# Patient Record
Sex: Female | Born: 1980 | Race: Black or African American | Hispanic: No | Smoking: Never smoker
Health system: Southern US, Community
[De-identification: ages and names within clinical notes are randomized; demographics above are authoritative.]

## PROBLEM LIST (undated history)

## (undated) ENCOUNTER — Inpatient Hospital Stay (HOSPITAL_COMMUNITY): Payer: Self-pay

## (undated) DIAGNOSIS — E119 Type 2 diabetes mellitus without complications: Secondary | ICD-10-CM

## (undated) DIAGNOSIS — E669 Obesity, unspecified: Secondary | ICD-10-CM

## (undated) DIAGNOSIS — R87619 Unspecified abnormal cytological findings in specimens from cervix uteri: Secondary | ICD-10-CM

## (undated) DIAGNOSIS — B009 Herpesviral infection, unspecified: Secondary | ICD-10-CM

## (undated) HISTORY — DX: Unspecified abnormal cytological findings in specimens from cervix uteri: R87.619

## (undated) HISTORY — PX: WISDOM TOOTH EXTRACTION: SHX21

## (undated) HISTORY — DX: Herpesviral infection, unspecified: B00.9

## (undated) HISTORY — DX: Obesity, unspecified: E66.9

---

## 2009-03-06 ENCOUNTER — Other Ambulatory Visit: Admission: RE | Admit: 2009-03-06 | Discharge: 2009-03-06 | Payer: Self-pay | Admitting: Family Medicine

## 2009-03-06 ENCOUNTER — Encounter: Payer: Self-pay | Admitting: Family Medicine

## 2009-03-06 ENCOUNTER — Ambulatory Visit: Payer: Self-pay | Admitting: Family Medicine

## 2009-03-14 ENCOUNTER — Encounter: Payer: Self-pay | Admitting: Family Medicine

## 2009-03-23 ENCOUNTER — Telehealth (INDEPENDENT_AMBULATORY_CARE_PROVIDER_SITE_OTHER): Payer: Self-pay | Admitting: *Deleted

## 2009-04-06 ENCOUNTER — Ambulatory Visit: Payer: Self-pay | Admitting: Family Medicine

## 2009-04-06 DIAGNOSIS — R030 Elevated blood-pressure reading, without diagnosis of hypertension: Secondary | ICD-10-CM | POA: Insufficient documentation

## 2009-05-18 ENCOUNTER — Telehealth: Payer: Self-pay | Admitting: Family Medicine

## 2010-01-23 ENCOUNTER — Telehealth (INDEPENDENT_AMBULATORY_CARE_PROVIDER_SITE_OTHER): Payer: Self-pay | Admitting: *Deleted

## 2010-03-27 ENCOUNTER — Ambulatory Visit: Payer: Self-pay | Admitting: Family Medicine

## 2010-03-27 DIAGNOSIS — H9209 Otalgia, unspecified ear: Secondary | ICD-10-CM | POA: Insufficient documentation

## 2010-03-27 DIAGNOSIS — H6120 Impacted cerumen, unspecified ear: Secondary | ICD-10-CM

## 2010-03-31 ENCOUNTER — Telehealth (INDEPENDENT_AMBULATORY_CARE_PROVIDER_SITE_OTHER): Payer: Self-pay

## 2010-04-10 ENCOUNTER — Other Ambulatory Visit
Admission: RE | Admit: 2010-04-10 | Discharge: 2010-04-10 | Payer: Self-pay | Source: Home / Self Care | Admitting: Family Medicine

## 2010-04-10 ENCOUNTER — Ambulatory Visit: Payer: Self-pay | Admitting: Family Medicine

## 2010-04-10 LAB — CONVERTED CEMR LAB
AST: 14 units/L
Alkaline Phosphatase: 98 units/L
BUN: 13 mg/dL
Creatinine, Ser: 0.71 mg/dL
HDL: 58 mg/dL
LDL Cholesterol: 70 mg/dL
Potassium: 4.3 meq/L
Total Bilirubin: 0.2 mg/dL
Triglycerides: 118 mg/dL

## 2010-04-12 ENCOUNTER — Ambulatory Visit: Payer: Self-pay | Admitting: Family Medicine

## 2010-04-12 LAB — CONVERTED CEMR LAB: Pap Smear: NEGATIVE

## 2010-04-13 ENCOUNTER — Encounter: Payer: Self-pay | Admitting: Family Medicine

## 2010-06-05 NOTE — Assessment & Plan Note (Signed)
Summary: CPE with pap   Vital Signs:  Patient profile:   30 year old female Height:      67 inches Weight:      254 pounds BMI:     39.93 O2 Sat:      100 % on Room air Pulse rate:   86 / minute BP sitting:   117 / 79  (left arm) Cuff size:   large  Vitals Entered By: Payton Spark CMA (April 10, 2010 9:02 AM)  O2 Flow:  Room air CC: CPE w/ pap. Needs immunizations and ears flushed.   Primary Care Provider:  Seymour Bars DO  CC:  CPE w/ pap. Needs immunizations and ears flushed..  History of Present Illness: Tammy Roman presents for CPE with pap smear.  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  Doing well on Yasmin.  Would like STD testing and titers done for work.  She is due for Tetanus vaccine and flu shot.  Has an aunt who had premenopausal breast cancer but no other family members.  She is not a smoker.  Due for fasting labs.  Rarely gets HSV outbreaks.  C/O continued ear pressure after being treated at [facility] for an ear infection.  Current Medications (verified): 1)  Valtrex 500 Mg Tabs (Valacyclovir Hcl) .Marland Kitchen.. 1 Tab By Mouth Daily 2)  Yasmin 28 3-0.03 Mg Tabs (Drospirenone-Ethinyl Estradiol) .Marland Kitchen.. 1 Tab By Mouth Daily  Allergies (verified): No Known Drug Allergies  Past History:  Past Medical History: Reviewed history from 03/27/2010 and no changes required. G1P1 obesity HSV 2  Family History: M/F + HTN, high cholesterol paternal aunt premenopausal breast cancer  Social History: Works for [employer]. Married.  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 Never smoked.  Occas ETOH. No exercise.  Review of Systems  The patient denies anorexia, fever, weight loss, weight gain, vision loss, decreased hearing, hoarseness, chest pain, syncope, dyspnea on exertion, peripheral edema, prolonged cough, headaches, hemoptysis, abdominal pain, melena, hematochezia, severe indigestion/heartburn, hematuria, incontinence, genital sores, muscle weakness, suspicious skin  lesions, transient blindness, difficulty walking, depression, unusual weight change, abnormal bleeding, enlarged lymph nodes, angioedema, breast masses, and testicular masses.    Physical Exam  General:  alert, well-developed, well-nourished, and well-hydrated.  obese Head:  normocephalic and atraumatic.   Eyes:  pupils equal, pupils round, and pupils reactive to light.   Ears:  EACs patent; TMs translucent and gray with good cone of light and bony landmarks. after ear irrigation here. Mouth:  good dentition and pharynx pink and moist.   Neck:  no masses.   Breasts:  No mass, nodules, thickening, tenderness, bulging, retraction, inflamation, nipple discharge or skin changes noted.   Lungs:  Normal respiratory effort, chest expands symmetrically. Lungs are clear to auscultation, no crackles or wheezes. Heart:  Normal rate and regular rhythm. S1 and S2 normal without gallop, murmur, click, rub or other extra sounds. Abdomen:  Bowel sounds positive,abdomen soft and non-tender without masses, organomegaly or hernias noted. Genitalia:  Pelvic Exam:        External: normal female genitalia without lesions or masses        Vagina: normal without lesions or masses        Cervix: normal without lesions or masses        Adnexa: normal bimanual exam without masses or fullness        Uterus: normal by palpation        Pap smear: performed Pulses:  2+ radial  and pedal pulses Extremities:  trace LE edema with pes planus Skin:  color normal.   Cervical Nodes:  No lymphadenopathy noted Psych:  good eye contact, not anxious appearing, and not depressed appearing.     Impression & Recommendations:  Problem # 1:  ROUTINE GYNECOLOGICAL EXAMINATION (ICD-V72.31) Thin prep pap done. Continue Yasmin for birth control. BP at goal.  BMI 39 c/w class II obesity. Work on [employer], regular exercise, wt loss. Add MVI and Vitamin D 1,000 International Units/ day.   STD testing done per pt request. Fasting  labs ordered. Immunizations updated. RTC 1 yr, sooner if needed. Ears successfully irrigated today.  Complete Medication List: 1)  Valtrex 500 Mg Tabs (Valacyclovir hcl) .Marland Kitchen.. 1 tab by mouth daily 2)  Yasmin 28 3-0.03 Mg Tabs (Drospirenone-ethinyl estradiol) .Marland Kitchen.. 1 tab by mouth daily  Other Orders: T-Comprehensive Metabolic Panel [phone]) T-Lipid Profile [phone]) T-HIV Antibody  (Reflex) [id number]) T-Hepatitis C Antibody (25427-06237) T-RPR (Syphilis) [phone]) T-Measles (Rubeola) Antibody IgG (60737-10626) T-Mumps Virus Antibody, IgG (94854-62703) T-Rubella Antibody (50093-81829) T-Varicella-Zoster Antibody (93716-96789) Tdap => 53yrs IM (38101) Admin 1st Vaccine (75102) TB Skin Test (58527) Admin of Any Addtl Vaccine (78242) Admin 1st Vaccine (35361) Flu Vaccine 49yrs + (44315)  Patient Instructions: 1)  Stay on current meds. 2)  Fasting labs at [facility] today. 3)  Will call you w/ results tomorrow. 4)  Will call you w/ pap smear results in the next week. 5)  Return in 1 yr, sooner if needed.   Orders Added: 1)  T-Comprehensive Metabolic Panel [[id number]] 2)  T-Lipid Profile [80061-22930] 3)  T-HIV Antibody  (Reflex) [[id number]] 4)  T-Hepatitis C Antibody [09326-71245] 5)  T-RPR (Syphilis) [80998-33825] 6)  T-Measles (Rubeola) Antibody IgG [05397-67341] 7)  T-Mumps Virus Antibody, IgG [93790-24097] 8)  T-Rubella Antibody [35329-92426] 9)  T-Varicella-Zoster Antibody [83419-62229] 10)  Est. Patient age 6-39 [20] 3)  Tdap => 30yrs IM [90715] 12)  Admin 1st Vaccine [90471] 13)  TB Skin Test [86580] 14)  Admin of Any Addtl Vaccine [90472] 15)  Admin 1st Vaccine [90471] 16)  Flu Vaccine 23yrs + [79892]   Immunizations Administered:  Tetanus Vaccine:    Vaccine Type: Tdap    Site: right deltoid    Dose: 0.5 ml    Route: IM    Given by: Payton Spark CMA    Exp. Date: 02/23/2012    Lot #: [id number]    VIS given: 03/23/08 version given  April 10, 2010.  PPD Skin Test:    Vaccine Type: PPD    Site: left forearm    Dose: 0.1 ml    Route: ID    Given by: Payton Spark CMA   Immunizations Administered:  Tetanus Vaccine:    Vaccine Type: Tdap    Site: right deltoid    Dose: 0.5 ml    Route: IM    Given by: Payton Spark CMA    Exp. Date: 02/23/2012    Lot #: [id number]    VIS given: 03/23/08 version given April 10, 2010.  PPD Skin Test:    Vaccine Type: PPD    Site: left forearm    Dose: 0.1 ml    Route: ID    Given by: Payton Spark CMA Flu Vaccine Consent Questions     Do you have a history of severe allergic reactions to this vaccine? no    Any prior history of allergic reactions to egg and/or gelatin? no    Do you have a sensitivity to the  preservative Thimersol? no    Do you have a past history of Guillan-Barre Syndrome? no    Do you currently have an acute febrile illness? no    Have you ever had a severe reaction to latex? no    Vaccine information given and explained to patient? yes    Are you currently pregnant? no    Lot Number:[id number]   Exp Date:11/03/2010   Site Given  Left Deltoid IM: left forearm    Dose: 0.1 ml    Route: ID    Given by: Payton Spark CMA     .lbflu   Appended Document: CPE with pap     Allergies: No Known Drug Allergies   Complete Medication List: 1)  Valtrex 500 Mg Tabs (Valacyclovir hcl) .Marland Kitchen.. 1 tab by mouth daily 2)  Yasmin 28 3-0.03 Mg Tabs (Drospirenone-ethinyl estradiol) .Marland Kitchen.. 1 tab by mouth daily    PPD Results    Date of reading: 04/12/2010    Results: < 5mm    Interpretation: negative

## 2010-06-05 NOTE — Assessment & Plan Note (Signed)
Summary: EAR PAIN/TJ (rm 2)   Vital Signs:  Patient Profile:   30 Years Old Female CC:      pt c/o right ear pain x this AM Height:     67 inches Weight:      254.50 pounds O2 Sat:      100 % O2 treatment:    Room Air Temp:     98.5 degrees F oral Pulse rate:   90 / minute Resp:     16 per minute BP sitting:   131 / 82  (left arm) Cuff size:   large  Pt. in pain?   yes    Location:   right ear  Vitals Entered By: Lajean Saver RN (March 27, 2010 6:18 PM)                   Updated Prior Medication List: VALTREX 500 MG TABS (VALACYCLOVIR HCL) 1 tab by mouth daily YASMIN 28 3-0.03 MG TABS (DROSPIRENONE-ETHINYL ESTRADIOL) 1 tab by mouth daily  Current Allergies (reviewed today): No known allergies History of Present Illness Chief Complaint: pt c/o right ear pain x this AM History of Present Illness:  Subjective:  Patient complains of onset of right earache this morning.  She states that she has had a cold and sinus congestion for about a week, now resolved.  She also admits that she clenches her jaw whenever she is tens and often chews gum.  No drainage from ear.  REVIEW OF SYSTEMS Constitutional Symptoms       Complains of fever.     Denies chills, night sweats, weight loss, weight gain, and fatigue.  Eyes       Complains of glasses and contact lenses.      Denies change in vision, eye pain, eye discharge, and eye surgery. Ear/Nose/Throat/Mouth       Complains of change in hearing and ear pain.      Denies hearing loss/aids, ear discharge, dizziness, frequent runny nose, frequent nose bleeds, sinus problems, sore throat, hoarseness, and tooth pain or bleeding.  Respiratory       Denies dry cough, productive cough, wheezing, shortness of breath, asthma, bronchitis, and emphysema/COPD.  Cardiovascular       Denies murmurs, chest pain, and tires easily with exhertion.    Gastrointestinal       Complains of constipation.      Denies stomach pain, nausea/vomiting,  diarrhea, blood in bowel movements, and indigestion. Genitourniary       Denies painful urination, kidney stones, and loss of urinary control. Neurological       Complains of headaches.      Denies paralysis, seizures, and fainting/blackouts. Musculoskeletal       Denies muscle pain, joint pain, joint stiffness, decreased range of motion, redness, swelling, muscle weakness, and gout.  Skin       Denies bruising, unusual mles/lumps or sores, and hair/skin or nail changes.  Psych       Denies mood changes, temper/anger issues, anxiety/stress, speech problems, depression, and sleep problems. Other Comments: pt c/o right ear pain and HA x this AM. Pt reports taking Tylenol today. decreased hearing rt ear.   Past History:  Past Medical History: G1P1 obesity HSV 2  Social History: Reviewed history from 03/06/2009 and no changes required. Works for [employer]. Dating.  Lives w/ boyfriend. No kids. Never smoked.  Occas ETOH. No exercise.   Objective:  No acute distress  Eyes:  Pupils are equal, round, and reactive to  light and accomdation.  Extraocular movement is intact.  Conjunctivae are not inflamed.  Ears:  Left canal partly occluded with cerumin.  Right canal completely occluded with cerumin.  Post lavage, canal still partly occluded, and canal mildly inflamed.  Tenderness over right temporomandibular joint  Nose:  Normal septum.  Normal turbinates, mildly congested.    No sinus tenderness present.  Mouth:  No tooth tenderness Pharynx:  Normal  Neck:  Supple.  No adenopathy is present.  Assessment New Problems: CERUMEN IMPACTION, RIGHT (ICD-380.4) EAR PAIN, RIGHT (ICD-388.70)  ? RIGHT OTITIS MEDIA.  RIGHT OTALGIA MAY BE PARTLY A RESULT OF RIGHT TMJ INFLAMMATION.  Plan New Medications/Changes: LORTAB 5 5-500 MG TABS (HYDROCODONE-ACETAMINOPHEN) One or two tabs by mouth hs as needed pain  #10 (ten) x 0, 03/27/2010, Donna Christen MD CIPRODEX 0.3-0.1 % SUSP  (CIPROFLOXACIN-DEXAMETHASONE) 4 gtts in affected ear two times a day for 7 days  #7.5cc x 0, 03/27/2010, Donna Christen MD AMOXICILLIN 875 MG TABS (AMOXICILLIN) One by mouth two times a day  #20 x 0, 03/27/2010, Donna Christen MD  New Orders: Cerumen Impaction Removal [69210] New Patient Level III [99203] Planning Comments:   Begin Ciprodex otic solution.  Begin oral amoxicillin.  Follow-up with ENT within one week to clear ear canal and examine ear. Advised to avoid chewy foods.  Apply ice pack to right TMJ several times daily.  Lortab for pain at night.   The patient and/or caregiver has been counseled thoroughly with regard to medications prescribed including dosage, schedule, interactions, rationale for use, and possible side effects and they verbalize understanding.  Diagnoses and expected course of recovery discussed and will return if not improved as expected or if the condition worsens. Patient and/or caregiver verbalized understanding.  Prescriptions: LORTAB 5 5-500 MG TABS (HYDROCODONE-ACETAMINOPHEN) One or two tabs by mouth hs as needed pain  #10 (ten) x 0   Entered and Authorized by:   Donna Christen MD   Signed by:   Donna Christen MD on 03/27/2010   Method used:   Print then Give to Patient   RxID:   [id number] CIPRODEX 0.3-0.1 % SUSP (CIPROFLOXACIN-DEXAMETHASONE) 4 gtts in affected ear two times a day for 7 days  #7.5cc x 0   Entered and Authorized by:   Donna Christen MD   Signed by:   Donna Christen MD on 03/27/2010   Method used:   Print then Give to Patient   RxID:   [id number] AMOXICILLIN 875 MG TABS (AMOXICILLIN) One by mouth two times a day  #20 x 0   Entered and Authorized by:   Donna Christen MD   Signed by:   Donna Christen MD on 03/27/2010   Method used:   Print then Give to Patient   RxID:   [id number]   Orders Added: 1)  Cerumen Impaction Removal [01027] 2)  New Patient Level III [25366]

## 2010-06-05 NOTE — Letter (Signed)
Summary: Out of Work  [facility]  [address]   [place], [place] [address]   Phone: [phone]  Fax: [phone]    March 27, 2010   Employee:  [employer]    To Whom It May Concern:   For Medical reasons, please excuse the above named employee from work tomorrow.  If you need additional information, please feel free to contact our office.         Sincerely,    Donna Christen MD

## 2010-06-05 NOTE — Miscellaneous (Signed)
Summary: labs  Clinical Lists Changes  Observations: Added new observation of LDL: 70 mg/dL (78/93/8101 75:10) Added new observation of HDL: 58 mg/dL (25/85/2778 24:23) Added new observation of TRIGLYC TOT: 118 mg/dL (53/61/4431 54:00) Added new observation of CHOLESTEROL: 152 mg/dL (86/76/1950 93:26) Added new observation of CALCIUM: 8.6 mg/dL (71/24/5809 98:33) Added new observation of ALBUMIN: 3.8 g/dL (82/50/5397 67:34) Added new observation of PROTEIN, TOT: 6.6 g/dL (19/37/9024 09:73) Added new observation of SGPT (ALT): 17 units/L (04/10/2010 12:54) Added new observation of SGOT (AST): 14 units/L (04/10/2010 12:54) Added new observation of ALK PHOS: 98 units/L (04/10/2010 12:54) Added new observation of BILI TOTAL: 0.2 mg/dL (53/29/9242 68:34) Added new observation of CREATININE: 0.71 mg/dL (19/62/2297 98:92) Added new observation of BUN: 13 mg/dL (11/94/1740 81:44) Added new observation of BG RANDOM: 85 mg/dL (81/85/6314 97:02) Added new observation of CO2 PLSM/SER: 22 meq/L (04/10/2010 12:54) Added new observation of CL SERUM: 103 meq/L (04/10/2010 12:54) Added new observation of K SERUM: 4.3 meq/L (04/10/2010 12:54) Added new observation of NA: 138 meq/L (04/10/2010 12:54)  Appended Document: labs Pls let pt know that her fasting sugar, liver, kidney function and cholesterol look great.  She tested NEGATIVE for HIV, Hepatitis C anc syphilis.   Her antibodies to Varicella, and MMR are still pending.  Seymour Bars, D.O.  Appended Document: labs Pt aware of the above

## 2010-06-05 NOTE — Progress Notes (Signed)
Summary: Would like daily valtrex  Phone Note Call from Patient   Caller: Patient Summary of Call: Pt would like to start taking Valtrex daily again. She states she feels more comfortable taking daily. Initial call taken by: Payton Spark CMA,  May 18, 2009 2:01 PM    New/Updated Medications: VALTREX 500 MG TABS (VALACYCLOVIR HCL) 1 tab by mouth daily Prescriptions: VALTREX 500 MG TABS (VALACYCLOVIR HCL) 1 tab by mouth daily  #30 x 3   Entered and Authorized by:   Seymour Bars DO   Signed by:   Seymour Bars DO on 05/18/2009   Method used:   Electronically to        [facility] [phone]* (retail)       [address] [place], [place]  [address]       Ph: [phone] or [phone]       Fax: [phone]   RxID:   [id number]

## 2010-06-05 NOTE — Progress Notes (Signed)
Summary: Requests RC  Phone Note Call from Patient   Caller: Patient [phone] Summary of Call: Pt requests RC  Initial call taken by: Payton Spark CMA,  January 23, 2010 12:09 PM  Follow-up for Phone Call        [facility] asking for Pt to [facility] if she still needs me. Follow-up by: Payton Spark CMA,  January 23, 2010 1:57 PM

## 2010-06-05 NOTE — Progress Notes (Signed)
Summary: Courtesy call  Phone Note Outgoing Call   Call placed by: Areta Haber CMA,  March 31, 2010 12:26 PM Call placed to: Patient Summary of Call: Courtesy call to pt - courtesy mess LOVM of cell number. Initial call taken by: Areta Haber CMA,  March 31, 2010 12:27 PM

## 2010-09-17 ENCOUNTER — Other Ambulatory Visit: Payer: Self-pay | Admitting: Family Medicine

## 2010-09-21 ENCOUNTER — Other Ambulatory Visit: Payer: Self-pay | Admitting: Family Medicine

## 2011-02-04 ENCOUNTER — Other Ambulatory Visit: Payer: Self-pay | Admitting: *Deleted

## 2011-02-04 MED ORDER — VALACYCLOVIR HCL 500 MG PO TABS: 500.0000 mg | ORAL_TABLET | Freq: Every day | ORAL | Status: DC

## 2011-03-03 ENCOUNTER — Encounter: Payer: Self-pay | Admitting: Family Medicine

## 2011-03-05 ENCOUNTER — Encounter: Payer: Self-pay | Admitting: Family Medicine

## 2011-03-05 ENCOUNTER — Other Ambulatory Visit (HOSPITAL_COMMUNITY)
Admission: RE | Admit: 2011-03-05 | Discharge: 2011-03-05 | Disposition: A | Discharge status: Home / Self Care | Payer: 59 | Source: Ambulatory Visit | Attending: Family Medicine | Admitting: Family Medicine

## 2011-03-05 ENCOUNTER — Ambulatory Visit (INDEPENDENT_AMBULATORY_CARE_PROVIDER_SITE_OTHER): Payer: 59 | Admitting: Family Medicine

## 2011-03-05 VITALS — BP 146/94 | HR 94 | Wt 264.0 lb

## 2011-03-05 DIAGNOSIS — R87810 Cervical high risk human papillomavirus (HPV) DNA test positive: Secondary | ICD-10-CM | POA: Insufficient documentation

## 2011-03-05 DIAGNOSIS — Z0000 Encounter for general adult medical examination without abnormal findings: Secondary | ICD-10-CM

## 2011-03-05 DIAGNOSIS — Z01419 Encounter for gynecological examination (general) (routine) without abnormal findings: Secondary | ICD-10-CM | POA: Insufficient documentation

## 2011-03-05 MED ORDER — DROSPIRENONE-ETHINYL ESTRADIOL 3-0.03 MG PO TABS: 1.0000 | ORAL_TABLET | Freq: Every day | ORAL | Status: DC

## 2011-03-05 NOTE — Patient Instructions (Signed)
Start a regular exercise program and make sure you are eating a healthy diet Try to eat 4 servings of dairy a day or take a calcium supplement (500mg twice a day). Your vaccines are up to date.  We will call you with your lab results.  

## 2011-03-05 NOTE — Progress Notes (Signed)
  Subjective:     ALMADELIA LOOMAN is a 30 y.o. female and is here for a comprehensive physical exam. The patient reports no problems.Periods are regular. BM are more irregular.    History   Social History  . Marital Status: Married    Spouse Name: Kandee Keen    Number of Children: 1   . Years of Education: N/A   Occupational History  . [employer]ICE    Social History Main Topics  . Smoking status: Never Smoker   . Smokeless tobacco: Not on file  . Alcohol Use: 0.0 oz/week     occasional  . Drug Use: Not on file  . Sexually Active: Yes -- Female partner(s)     works for [employer], married, finishing CMA school, married, no kids, no exercise.   Other Topics Concern  . Not on file   Social History Narrative   Some regular exercise.  No caffeine daily.    Health Maintenance  Topic Date Due  . Influenza Vaccine  02/04/2012  . Pap Smear  03/04/2014  . Tetanus/tdap  04/10/2020    The following portions of the patient's history were reviewed and updated as appropriate: allergies, current medications, past family history, past medical history, past social history, past surgical history and problem list.  Review of Systems A comprehensive review of systems was negative.   Objective:    BP 146/94  Pulse 94  Wt 264 lb (119.75 kg) General appearance: alert, cooperative, appears stated age and moderately obese Head: Normocephalic, without obvious abnormality, atraumatic Eyes: conjunctiva clear, EOMI, PEERLA Ears: normal TM's and external ear canals both ears Nose: Nares normal. Septum midline. Mucosa normal. No drainage or sinus tenderness. Throat: lips, mucosa, and tongue normal; teeth and gums normal Neck: no adenopathy, no carotid bruit, supple, symmetrical, trachea midline and thyroid not enlarged, symmetric, no tenderness/mass/nodules Back: symmetric, no curvature. ROM normal. No CVA tenderness. Lungs: clear to auscultation bilaterally Breasts: normal appearance, no  masses or tenderness Heart: regular rate and rhythm, S1, S2 normal, no murmur, click, rub or gallop Abdomen: soft, non-tender; bowel sounds normal; no masses,  no organomegaly Pelvic: cervix normal in appearance, external genitalia normal, no adnexal masses or tenderness, no cervical motion tenderness, rectovaginal septum normal, uterus normal size, shape, and consistency and vagina normal without discharge Extremities: extremities normal, atraumatic, no cyanosis or edema Pulses: 2+ and symmetric Skin: Skin color, texture, turgor normal. No rashes or lesions Lymph nodes: Cervical, supraclavicular, and axillary nodes normal. Neurologic: Grossly normal    Assessment:    Healthy female exam.      Plan:     See After Visit Summary for Counseling Recommendations  Start a regular exercise program and make sure you are eating a healthy diet Try to eat 4 servings of dairy a day or take a calcium supplement (500mg  twice a day). Your vaccines are up to date.  Due for screening labs. Will call with the results.

## 2011-03-08 ENCOUNTER — Other Ambulatory Visit: Payer: Self-pay | Admitting: *Deleted

## 2011-03-08 MED ORDER — DROSPIRENONE-ETHINYL ESTRADIOL 3-0.03 MG PO TABS: 1.0000 | ORAL_TABLET | Freq: Every day | ORAL | Status: DC

## 2011-03-12 ENCOUNTER — Other Ambulatory Visit: Payer: Self-pay | Admitting: Family Medicine

## 2011-03-12 DIAGNOSIS — R87619 Unspecified abnormal cytological findings in specimens from cervix uteri: Secondary | ICD-10-CM

## 2011-04-02 ENCOUNTER — Encounter: Payer: Self-pay | Admitting: Obstetrics & Gynecology

## 2011-04-02 ENCOUNTER — Ambulatory Visit (INDEPENDENT_AMBULATORY_CARE_PROVIDER_SITE_OTHER): Payer: 59 | Admitting: Obstetrics & Gynecology

## 2011-04-02 DIAGNOSIS — Z0000 Encounter for general adult medical examination without abnormal findings: Secondary | ICD-10-CM

## 2011-04-02 DIAGNOSIS — IMO0002 Reserved for concepts with insufficient information to code with codable children: Secondary | ICD-10-CM

## 2011-04-02 DIAGNOSIS — R87612 Low grade squamous intraepithelial lesion on cytologic smear of cervix (LGSIL): Secondary | ICD-10-CM

## 2011-04-02 NOTE — Progress Notes (Signed)
Addended by: Granville Lewis on: 04/02/2011 04:16 PM   Modules accepted: Orders

## 2011-04-02 NOTE — Progress Notes (Signed)
  Subjective:    Patient ID: Tammy Roman, female    DOB: 04-27-1981, 30 y.o.   MRN: [id number]  HPI  She is here for a LGSIL pap. She gives a distant history of an abnormal pap in about 2006. There was no treatment, just repeated her pap.  Review of Systems    She is not a smoker Objective:   Physical Exam   Her colposcopy was adequate and normal. I did a thorough ECC.     Assessment & Plan:  LGSIL pap- If her ECC is negative, I would recommend another pap in 6 months.

## 2011-04-02 NOTE — Progress Notes (Signed)
Addended by: Granville Lewis on: 04/02/2011 03:55 PM   Modules accepted: Orders

## 2011-04-11 LAB — LIPID PANEL
HDL: 53 mg/dL (ref >39)
LDL Cholesterol: 78 mg/dL (ref 0–99)
Total CHOL/HDL Ratio: 3 Ratio
Triglycerides: 140 mg/dL (ref <150)
VLDL: 28 mg/dL (ref 0–40)

## 2011-04-11 LAB — COMPLETE METABOLIC PANEL WITH GFR
ALT: 13 U/L (ref 0–35)
AST: 11 U/L (ref 0–37)
Alkaline Phosphatase: 92 U/L (ref 39–117)
Creat: 0.9 mg/dL (ref 0.50–1.10)
GFR, Est African American: >89 mL/min (ref >90)
Sodium: 138 mEq/L (ref 135–145)
Total Bilirubin: 0.3 mg/dL (ref 0.3–1.2)

## 2011-04-11 LAB — HCG, SERUM, QUALITATIVE: Preg, Serum: NEGATIVE

## 2011-06-07 ENCOUNTER — Other Ambulatory Visit: Payer: Self-pay | Admitting: *Deleted

## 2011-06-07 MED ORDER — VALACYCLOVIR HCL 500 MG PO TABS: 500.0000 mg | ORAL_TABLET | Freq: Every day | ORAL | Status: DC

## 2011-09-20 ENCOUNTER — Ambulatory Visit (INDEPENDENT_AMBULATORY_CARE_PROVIDER_SITE_OTHER): Payer: 59 | Admitting: Family Medicine

## 2011-09-20 VITALS — BP 125/86 | HR 90

## 2011-09-20 DIAGNOSIS — Z331 Pregnant state, incidental: Secondary | ICD-10-CM

## 2011-09-20 DIAGNOSIS — N926 Irregular menstruation, unspecified: Secondary | ICD-10-CM

## 2011-09-20 NOTE — Progress Notes (Signed)
  Subjective:    Patient ID: Tammy Roman, female    DOB: Apr 10, 1981, 31 y.o.   MRN: [id number] Pregnancy test positive. First day of last period was 08/20/11. Pt states she is already on prenatal vitamins. States she would like to be referred to Dr. Huel Cote in [place]. HPI    Review of Systems     Objective:   Physical Exam        Assessment & Plan:

## 2011-09-20 NOTE — Progress Notes (Signed)
  Subjective:    Patient ID: Tammy Roman, female    DOB: 1981-01-08, 31 y.o.   MRN: [id number]  HPI  Here for possible pregnancy. She had to positive home test. She has been taking a prenatal vitamin. First day of her last period was April 16.  Review of Systems     Objective:   Physical Exam        Assessment & Plan:  Pregnancy-UPT was positive. Her dates are proximally 4 weeks and 3 days. Her due date would be in January. She does have a provider she would like to see for prenatal care. Continue prenatal vitamin.

## 2011-10-17 LAB — OB RESULTS CONSOLE ABO/RH: RH Type: POSITIVE

## 2011-10-17 LAB — OB RESULTS CONSOLE ANTIBODY SCREEN: Antibody Screen: NEGATIVE

## 2011-10-17 LAB — OB RESULTS CONSOLE HEPATITIS B SURFACE ANTIGEN: Hepatitis B Surface Ag: NEGATIVE

## 2011-10-17 LAB — OB RESULTS CONSOLE RUBELLA ANTIBODY, IGM: Rubella: IMMUNE

## 2011-10-21 ENCOUNTER — Other Ambulatory Visit: Payer: Self-pay | Admitting: *Deleted

## 2011-10-21 MED ORDER — VALACYCLOVIR HCL 500 MG PO TABS: 500.0000 mg | ORAL_TABLET | Freq: Every day | ORAL | Status: DC

## 2012-01-17 ENCOUNTER — Inpatient Hospital Stay (HOSPITAL_COMMUNITY): Payer: 59

## 2012-01-17 ENCOUNTER — Inpatient Hospital Stay (HOSPITAL_COMMUNITY)
Admission: AD | Admit: 2012-01-17 | Discharge: 2012-01-17 | Disposition: A | Discharge status: Home / Self Care | Payer: 59 | Source: Ambulatory Visit | Attending: Obstetrics and Gynecology | Admitting: Obstetrics and Gynecology

## 2012-01-17 ENCOUNTER — Encounter (HOSPITAL_COMMUNITY): Payer: Self-pay

## 2012-01-17 DIAGNOSIS — N949 Unspecified condition associated with female genital organs and menstrual cycle: Secondary | ICD-10-CM | POA: Insufficient documentation

## 2012-01-17 DIAGNOSIS — O26899 Other specified pregnancy related conditions, unspecified trimester: Secondary | ICD-10-CM

## 2012-01-17 DIAGNOSIS — O4410 Placenta previa with hemorrhage, unspecified trimester: Secondary | ICD-10-CM

## 2012-01-17 DIAGNOSIS — O4400 Placenta previa specified as without hemorrhage, unspecified trimester: Secondary | ICD-10-CM | POA: Insufficient documentation

## 2012-01-17 LAB — URINALYSIS, ROUTINE W REFLEX MICROSCOPIC
Bilirubin Urine: NEGATIVE
Glucose, UA: NEGATIVE mg/dL
Hgb urine dipstick: NEGATIVE
Ketones, ur: 15 mg/dL — AB
Specific Gravity, Urine: 1.01 (ref 1.005–1.030)
pH: 5.5 (ref 5.0–8.0)

## 2012-01-17 NOTE — MAU Note (Signed)
Patient states onset of feel like baby in a ball with some lower abdominal pressure has been present for several days with worsening today, denies vaginal discharge or Vaginal bleeding having regular bowel movements, denies N/V/D, denies dysuria, patient has had one previous full term vaginal delivery without complication.

## 2012-01-17 NOTE — MAU Provider Note (Signed)
Chief Complaint:  No chief complaint on file.   None     HPI: Tammy Roman is a 31 y.o. G2P1001 at 10w3dwho presents to maternity admissions reporting feeling "like baby is balling up" several times today accompanied by vaginal pressure.  She reports a diagnosis of placenta previa earlier in the pregnancy and has an U/S scheduled this week to evaluate this.  She reports fetal movement, denies LOF, vaginal bleeding, vaginal itching/burning, urinary symptoms, h/a, dizziness, n/v, or fever/chills.    Past Medical History: Past Medical History  Diagnosis Date  . Obesity   . HSV-2 (herpes simplex virus 2) infection   . No pertinent past medical history     Past Surgical History: No past surgical history on file.  Family History: Family History  Problem Relation Age of Onset  . Hyperlipidemia Mother   . Hypertension Mother   . Hyperlipidemia Father   . Hypertension Father   . Prostate cancer Father   . Cancer Father     prostate  . Breast cancer Paternal Aunt     metastasized     Social History: History  Substance Use Topics  . Smoking status: Never Smoker   . Smokeless tobacco: Never Used  . Alcohol Use: No     occasional    Allergies: No Known Allergies  Meds:  Prescriptions prior to admission  Medication Sig Dispense Refill  . Prenatal Vit-Fe Fumarate-FA (PRENATAL MULTIVITAMIN) TABS Take 1 tablet by mouth every morning.      . valACYclovir (VALTREX) 500 MG tablet Take 1 tablet (500 mg total) by mouth daily.  30 tablet  3    ROS: Pertinent findings in history of present illness.  Physical Exam  Blood pressure 139/66, pulse 102, temperature 98.2 F (36.8 C), temperature source Oral, resp. rate 16, height 5\' 7"  (1.702 m), weight 130.273 kg (287 lb 3.2 oz), last menstrual period 03/06/2011. GENERAL: Well-developed, well-nourished female in no acute distress.  HEENT: normocephalic HEART: normal rate RESP: normal effort ABDOMEN: Soft, non-tender, gravid  appropriate for gestational age EXTREMITIES: Nontender, no edema NEURO: alert and oriented SPECULUM EXAM:     FHT:  155 by doppler  Labs: Results for orders placed during the hospital encounter of 01/17/12 (from the past 24 hour(s))  URINALYSIS, ROUTINE W REFLEX MICROSCOPIC     Status: Abnormal   Collection Time   01/17/12  9:40 PM      Component Value Range   Color, Urine YELLOW  YELLOW   APPearance CLEAR  CLEAR   Specific Gravity, Urine 1.010  1.005 - 1.030   pH 5.5  5.0 - 8.0   Glucose, UA NEGATIVE  NEGATIVE mg/dL   Hgb urine dipstick NEGATIVE  NEGATIVE   Bilirubin Urine NEGATIVE  NEGATIVE   Ketones, ur 15 (*) NEGATIVE mg/dL   Protein, ur NEGATIVE  NEGATIVE mg/dL   Urobilinogen, UA 0.2  0.0 - 1.0 mg/dL   Nitrite NEGATIVE  NEGATIVE   Leukocytes, UA NEGATIVE  NEGATIVE    Imaging:  US Ob Limited  01/17/2012  *RADIOLOGY REPORT*  Clinical Data: History of placenta previa, evaluate placental distance from the cervical os.  LIMITED OBSTETRIC ULTRASOUND  Number of Fetuses: 1 Heart Rate: 144 bpm Movement: Documented Presentation: Cephalic Placental Location: Anterior.  Inferior placental edge is 1.6 cm from the internal os. Previa: Not identified Amniotic Fluid (Subjective): Within normal limits  Vertical pocket:  5.98cm  BPD: 5.04cm   21w   2d  EDC:  MATERNAL FINDINGS: Cervix: 4.54 Uterus/Adnexae:  Left ovary not seen.  Normal sonographic appearance to the right ovary.  The  IMPRESSION: Cardiac activity and movement documented.  Estimated age of 21 weeks 2 days by BPD.  Inferior placental edge measures 1.6 cm from the internal os.  Recommend followup with non-emergent complete OB 14+ wk US examination for fetal biometric evaluation and anatomic survey if not already performed.   Original Report Authenticated By: Waneta Martins, M.D.    US Ob Transvaginal  01/17/2012  *RADIOLOGY REPORT*  Clinical Data: History of placenta previa, evaluate placental distance from the cervical os.  LIMITED  OBSTETRIC ULTRASOUND  Number of Fetuses: 1 Heart Rate: 144 bpm Movement: Documented Presentation: Cephalic Placental Location: Anterior.  Inferior placental edge is 1.6 cm from the internal os. Previa: Not identified Amniotic Fluid (Subjective): Within normal limits  Vertical pocket:  5.98cm  BPD: 5.04cm   21w   2d  EDC:  MATERNAL FINDINGS: Cervix: 4.54 Uterus/Adnexae: Left ovary not seen.  Normal sonographic appearance to the right ovary.  The  IMPRESSION: Cardiac activity and movement documented.  Estimated age of 21 weeks 2 days by BPD.  Inferior placental edge measures 1.6 cm from the internal os.  Recommend followup with non-emergent complete OB 14+ wk US examination for fetal biometric evaluation and anatomic survey if not already performed.   Original Report Authenticated By: Waneta Martins, M.D.    ED Course Per consult with Dr Henderson Cloud, U/S ordered tonight to evaluate placenta previa  Assessment: Pelvic pressure in pregnancy Placenta previa resolved  Plan: Discharge home PTL precautions given Drink plenty of fluids Reviewed U/S findings, resolution of previa, and cervical length of >4cm with pt and s/o F/U with Dr Renaldo Fiddler Return to MAU as needed    Medication List     As of 01/17/2012 10:14 PM    ASK your doctor about these medications         prenatal multivitamin Tabs   Take 1 tablet by mouth every morning.      valACYclovir 500 MG tablet   Commonly known as: VALTREX   Take 1 tablet (500 mg total) by mouth daily.        Sharen Counter Certified Nurse-Midwife 01/17/2012 10:14 PM

## 2012-05-06 NOTE — L&D Delivery Note (Signed)
Delivery Note At 11:42 PM a viable female was delivered via  (Presentation: ;  ).  APGAR: , ; weight .   Placenta status: , .  Cord:  with the following complications: .  Cord pH: not sent  Anesthesia: Epidural  Episiotomy: none Lacerations: 2nd deg Suture Repair: 3.0 vicryl rapide Est. Blood Loss (mL): 400  Mom to postpartum.  Baby to nursery-stable.  Meriel Pica 05/23/2012, 11:55 PM

## 2012-05-14 ENCOUNTER — Institutional Professional Consult (permissible substitution): Payer: 59 | Admitting: Pediatrics

## 2012-05-23 ENCOUNTER — Encounter (HOSPITAL_COMMUNITY): Payer: Self-pay | Admitting: Obstetrics and Gynecology

## 2012-05-23 ENCOUNTER — Inpatient Hospital Stay (HOSPITAL_COMMUNITY)
Admission: AD | Admit: 2012-05-23 | Discharge: 2012-05-25 | DRG: 774 | Disposition: A | Discharge status: Home / Self Care | Payer: 59 | Source: Ambulatory Visit | Attending: Obstetrics and Gynecology | Admitting: Obstetrics and Gynecology

## 2012-05-23 DIAGNOSIS — O9902 Anemia complicating childbirth: Secondary | ICD-10-CM | POA: Diagnosis present

## 2012-05-23 DIAGNOSIS — O99893 Other specified diseases and conditions complicating puerperium: Secondary | ICD-10-CM | POA: Diagnosis not present

## 2012-05-23 DIAGNOSIS — Z6841 Body Mass Index (BMI) 40.0 and over, adult: Secondary | ICD-10-CM

## 2012-05-23 DIAGNOSIS — M79609 Pain in unspecified limb: Secondary | ICD-10-CM | POA: Diagnosis not present

## 2012-05-23 DIAGNOSIS — E669 Obesity, unspecified: Secondary | ICD-10-CM | POA: Diagnosis present

## 2012-05-23 DIAGNOSIS — O99214 Obesity complicating childbirth: Secondary | ICD-10-CM | POA: Diagnosis present

## 2012-05-23 DIAGNOSIS — M461 Sacroiliitis, not elsewhere classified: Secondary | ICD-10-CM | POA: Diagnosis present

## 2012-05-23 DIAGNOSIS — M25559 Pain in unspecified hip: Secondary | ICD-10-CM | POA: Diagnosis not present

## 2012-05-23 DIAGNOSIS — E876 Hypokalemia: Secondary | ICD-10-CM | POA: Diagnosis not present

## 2012-05-23 DIAGNOSIS — M5410 Radiculopathy, site unspecified: Secondary | ICD-10-CM

## 2012-05-23 DIAGNOSIS — O85 Puerperal sepsis: Secondary | ICD-10-CM | POA: Diagnosis not present

## 2012-05-23 LAB — CBC
HCT: 36.2 % (ref 36.0–46.0)
MCHC: 32.3 g/dL (ref 30.0–36.0)
MCV: 84.4 fL (ref 78.0–100.0)
Platelets: 178 10*3/uL (ref 150–400)
RDW: 15 % (ref 11.5–15.5)
WBC: 11.7 10*3/uL — ABNORMAL HIGH (ref 4.0–10.5)

## 2012-05-23 LAB — PREPARE RBC (CROSSMATCH)

## 2012-05-23 LAB — POCT FERN TEST: POCT Fern Test: POSITIVE

## 2012-05-23 LAB — ABO/RH: ABO/RH(D): B POS

## 2012-05-23 MED ORDER — DIPHENHYDRAMINE HCL 50 MG/ML IJ SOLN: 12.5000 mg | INTRAMUSCULAR | Status: DC | PRN

## 2012-05-23 MED ORDER — LACTATED RINGERS IV SOLN
INTRAVENOUS | Status: DC
  Administered 2012-05-23: 13:00:00 via INTRAVENOUS

## 2012-05-23 MED ORDER — PHENYLEPHRINE 40 MCG/ML (10ML) SYRINGE FOR IV PUSH (FOR BLOOD PRESSURE SUPPORT): 80.0000 ug | PREFILLED_SYRINGE | INTRAVENOUS | Status: DC | PRN

## 2012-05-23 MED ORDER — EPHEDRINE 5 MG/ML INJ
10.0000 mg | INTRAVENOUS | Status: DC | PRN
  Filled 2012-05-23: qty 4

## 2012-05-23 MED ORDER — OXYTOCIN 40 UNITS IN LACTATED RINGERS INFUSION - SIMPLE MED
1.0000 m[IU]/min | INTRAVENOUS | Status: DC
  Administered 2012-05-23: 2 m[IU]/min via INTRAVENOUS

## 2012-05-23 MED ORDER — OXYCODONE-ACETAMINOPHEN 5-325 MG PO TABS: 1.0000 | ORAL_TABLET | ORAL | Status: DC | PRN

## 2012-05-23 MED ORDER — SODIUM BICARBONATE 8.4 % IV SOLN
INTRAVENOUS | Status: DC | PRN
  Administered 2012-05-23: 5 mL via EPIDURAL

## 2012-05-23 MED ORDER — ACETAMINOPHEN 325 MG PO TABS: 650.0000 mg | ORAL_TABLET | ORAL | Status: DC | PRN

## 2012-05-23 MED ORDER — CITRIC ACID-SODIUM CITRATE 334-500 MG/5ML PO SOLN: 30.0000 mL | ORAL | Status: DC | PRN

## 2012-05-23 MED ORDER — FENTANYL 2.5 MCG/ML BUPIVACAINE 1/10 % EPIDURAL INFUSION (WH - ANES)
14.0000 mL/h | INTRAMUSCULAR | Status: DC
  Administered 2012-05-23: 14 mL/h via EPIDURAL
  Filled 2012-05-23: qty 125

## 2012-05-23 MED ORDER — ONDANSETRON HCL 4 MG/2ML IJ SOLN: 4.0000 mg | Freq: Four times a day (QID) | INTRAMUSCULAR | Status: DC | PRN

## 2012-05-23 MED ORDER — OXYTOCIN 40 UNITS IN LACTATED RINGERS INFUSION - SIMPLE MED
62.5000 mL/h | INTRAVENOUS | Status: DC
  Filled 2012-05-23: qty 1000

## 2012-05-23 MED ORDER — OXYTOCIN BOLUS FROM INFUSION
500.0000 mL | INTRAVENOUS | Status: DC
  Administered 2012-05-23: 500 mL via INTRAVENOUS

## 2012-05-23 MED ORDER — LACTATED RINGERS IV SOLN
500.0000 mL | Freq: Once | INTRAVENOUS | Status: AC
  Administered 2012-05-23: 500 mL via INTRAVENOUS

## 2012-05-23 MED ORDER — TERBUTALINE SULFATE 1 MG/ML IJ SOLN: 0.2500 mg | Freq: Once | INTRAMUSCULAR | Status: AC | PRN

## 2012-05-23 MED ORDER — LACTATED RINGERS IV SOLN: 500.0000 mL | INTRAVENOUS | Status: DC | PRN

## 2012-05-23 MED ORDER — FLEET ENEMA 7-19 GM/118ML RE ENEM: 1.0000 | ENEMA | RECTAL | Status: DC | PRN

## 2012-05-23 MED ORDER — PHENYLEPHRINE 40 MCG/ML (10ML) SYRINGE FOR IV PUSH (FOR BLOOD PRESSURE SUPPORT)
80.0000 ug | PREFILLED_SYRINGE | INTRAVENOUS | Status: DC | PRN
  Filled 2012-05-23 (×2): qty 5

## 2012-05-23 MED ORDER — LIDOCAINE HCL (PF) 1 % IJ SOLN
30.0000 mL | INTRAMUSCULAR | Status: DC | PRN
  Administered 2012-05-23: 30 mL via SUBCUTANEOUS
  Filled 2012-05-23: qty 30

## 2012-05-23 MED ORDER — IBUPROFEN 600 MG PO TABS
600.0000 mg | ORAL_TABLET | Freq: Four times a day (QID) | ORAL | Status: DC | PRN
  Administered 2012-05-24: 600 mg via ORAL
  Filled 2012-05-23: qty 1

## 2012-05-23 NOTE — Anesthesia Preprocedure Evaluation (Signed)
Anesthesia Evaluation  Patient identified by MRN, date of birth, ID band Patient awake    Reviewed: Allergy & Precautions, H&P , Patient's Chart, lab work & pertinent test results  Airway Mallampati: III  TM Distance: >3 FB Neck ROM: full    Dental  (+) Teeth Intact   Pulmonary  breath sounds clear to auscultation        Cardiovascular Rhythm:regular Rate:Normal     Neuro/Psych    GI/Hepatic   Endo/Other  Morbid obesity  Renal/GU      Musculoskeletal   Abdominal   Peds  Hematology   Anesthesia Other Findings       Reproductive/Obstetrics (+) Pregnancy                             Anesthesia Physical Anesthesia Plan  ASA: III  Anesthesia Plan: Epidural   Post-op Pain Management:    Induction:   Airway Management Planned:   Additional Equipment:   Intra-op Plan:   Post-operative Plan:   Informed Consent: I have reviewed the patients History and Physical, chart, labs and discussed the procedure including the risks, benefits and alternatives for the proposed anesthesia with the patient or authorized representative who has indicated his/her understanding and acceptance.   Dental Advisory Given  Plan Discussed with:   Anesthesia Plan Comments: (Labs checked- platelets confirmed with RN in room. Fetal heart tracing, per RN, reported to be stable enough for sitting procedure. Discussed epidural, and patient consents to the procedure:  included risk of possible headache,backache, failed block, allergic reaction, and nerve injury. This patient was asked if she had any questions or concerns before the procedure started.)        Anesthesia Quick Evaluation  

## 2012-05-23 NOTE — Anesthesia Procedure Notes (Signed)
Epidural Patient location during procedure: OB  Preanesthetic Checklist Completed: patient identified, site marked, surgical consent, pre-op evaluation, timeout performed, IV checked, risks and benefits discussed and monitors and equipment checked  Epidural Patient position: sitting Prep: site prepped and draped and DuraPrep Patient monitoring: continuous pulse ox and blood pressure Approach: midline Injection technique: LOR air  Needle:  Needle type: Tuohy  Needle gauge: 17 G Needle length: 9 cm and 9 Needle insertion depth: 9 cm Catheter type: closed end flexible Catheter size: 19 Gauge Catheter at skin depth: 18 cm Test dose: negative  Assessment Events: blood not aspirated, injection not painful, no injection resistance, negative IV test and no paresthesia  Additional Notes Dosing of Epidural:  1st dose, through catheter ............................................. epi 1:200K + Xylocaine 40 mg  2nd dose, through catheter, after waiting 3 minutes.....epi 1:200K + Xylocaine 60 mg    ( 2% Xylo charted as a single dose in Epic Meds for ease of charting; actual dosing was fractionated as above, for saftey's sake)  As each dose occurred, patient was free of IV sx; and patient exhibited no evidence of SA injection.  Patient is more comfortable after epidural dosed. Please see RN's note for documentation of vital signs,and FHR which are stable.  Patient reminded not to try to ambulate with numb legs, and that an RN must be present when she attempts to get up.       

## 2012-05-23 NOTE — Progress Notes (Signed)
Still leaking clear AF, cx unchanged, will start pit aug per protocol

## 2012-05-23 NOTE — MAU Note (Signed)
Pt reports she saw some bloody show and panties were drenched. Reports having contractions about q good fetal movement reported.

## 2012-05-23 NOTE — MAU Note (Signed)
"  I felt leaking this morning at about 1030.  I also saw bloody show.  I changed pads afterwards.  I went to the BR here and my pad and pants were soaked.  (+) FM.  Some bloody show seen and mucus."

## 2012-05-23 NOTE — H&P (Signed)
Tammy Roman, Tammy Roman NO.:  [id number]  MEDICAL RECORD NO.:  [id number]  LOCATION:  9168                          FACILITY:  [facility]  PHYSICIAN:  Duke Salvia. Marcelle Overlie, M.D.DATE OF BIRTH:  05/26/1980  DATE OF ADMISSION:  05/23/2012 DATE OF DISCHARGE:                             HISTORY & PHYSICAL   CHIEF COMPLAINT:  Rupture of membranes at term.  HISTORY OF PRESENT ILLNESS:  A 32 year old, G2, P32, EDD is January 21, the patient seen in MAU earlier today with complaints of gross rupture of membranes, verified by examination in MAU.  She has a remote history of HSV.  Has been on prophylactic Valtrex at 35 weeks.  No evidence of any recent outbreak.  Prenatal course has otherwise been unremarkable by the patient's history.  Had an ultrasound in the office this week, this showed an EFW of 8-1/2 pounds.  GBS is negative.  PAST MEDICAL HISTORY:  Please see the [facility] form for details.  PHYSICAL EXAMINATION:  VITAL SIGNS:  Temp 98.2, blood pressure 120/78. HEENT:  Unremarkable. NECK:  Supple without masses. LUNGS:  Clear. CARDIOVASCULAR:  Regular rate and rhythm without murmurs, rubs, gallops noted. BREASTS:  Not examined. PELVIC:  Term fundal height.  Fetal heart rate 148, cervix was 2, 50% vertex, -2 clear fluid. EXTREMITIES: Unremarkable. NEUROLOGIC:  Unremarkable.  IMPRESSION:  Term pregnancy, rupture of membranes with early labor.  PLAN:  Admit, anticipate vaginal delivery.     Clydie Dillen M. Marcelle Overlie, M.D.     RM[facility]/MEDQ  D:  05/23/2012  T:  05/23/2012  Job:  [id number]

## 2012-05-23 NOTE — H&P (Signed)
Tammy Roman  DICTATION # [phone] CSN# [id number]   Tammy Pica, MD 05/23/2012 1:26 PM

## 2012-05-24 ENCOUNTER — Encounter (HOSPITAL_COMMUNITY): Payer: Self-pay | Admitting: *Deleted

## 2012-05-24 LAB — CBC
MCH: 27.3 pg (ref 26.0–34.0)
Platelets: 163 10*3/uL (ref 150–400)
RBC: 3.95 MIL/uL (ref 3.87–5.11)
WBC: 19.7 10*3/uL — ABNORMAL HIGH (ref 4.0–10.5)

## 2012-05-24 MED ORDER — CYCLOBENZAPRINE HCL 10 MG PO TABS
10.0000 mg | ORAL_TABLET | Freq: Three times a day (TID) | ORAL | Status: DC | PRN
  Administered 2012-05-25 (×2): 10 mg via ORAL
  Filled 2012-05-24 (×2): qty 1

## 2012-05-24 MED ORDER — KETOROLAC TROMETHAMINE 30 MG/ML IJ SOLN
30.0000 mg | Freq: Once | INTRAMUSCULAR | Status: AC
  Administered 2012-05-24: 30 mg via INTRAMUSCULAR
  Filled 2012-05-24: qty 1

## 2012-05-24 MED ORDER — DIBUCAINE 1 % RE OINT: 1.0000 "application " | TOPICAL_OINTMENT | RECTAL | Status: DC | PRN

## 2012-05-24 MED ORDER — ONDANSETRON HCL 4 MG/2ML IJ SOLN: 4.0000 mg | INTRAMUSCULAR | Status: DC | PRN

## 2012-05-24 MED ORDER — SIMETHICONE 80 MG PO CHEW: 80.0000 mg | CHEWABLE_TABLET | ORAL | Status: DC | PRN

## 2012-05-24 MED ORDER — DIPHENHYDRAMINE HCL 25 MG PO CAPS: 25.0000 mg | ORAL_CAPSULE | Freq: Four times a day (QID) | ORAL | Status: DC | PRN

## 2012-05-24 MED ORDER — ZOLPIDEM TARTRATE 5 MG PO TABS: 5.0000 mg | ORAL_TABLET | Freq: Every evening | ORAL | Status: DC | PRN

## 2012-05-24 MED ORDER — BENZOCAINE-MENTHOL 20-0.5 % EX AERO
1.0000 "application " | INHALATION_SPRAY | CUTANEOUS | Status: DC | PRN
  Administered 2012-05-24: 1 via TOPICAL
  Filled 2012-05-24: qty 56

## 2012-05-24 MED ORDER — OXYCODONE-ACETAMINOPHEN 5-325 MG PO TABS
1.0000 | ORAL_TABLET | Freq: Four times a day (QID) | ORAL | Status: DC | PRN
  Administered 2012-05-24: 1 via ORAL
  Administered 2012-05-24 (×2): 2 via ORAL
  Administered 2012-05-24: 1 via ORAL
  Administered 2012-05-25 (×3): 2 via ORAL
  Filled 2012-05-24 (×2): qty 2
  Filled 2012-05-24: qty 1
  Filled 2012-05-24 (×3): qty 2
  Filled 2012-05-24: qty 1

## 2012-05-24 MED ORDER — MEASLES, MUMPS & RUBELLA VAC ~~LOC~~ INJ
0.5000 mL | INJECTION | Freq: Once | SUBCUTANEOUS | Status: DC
  Filled 2012-05-24: qty 0.5

## 2012-05-24 MED ORDER — FLEET ENEMA 7-19 GM/118ML RE ENEM: 1.0000 | ENEMA | Freq: Every day | RECTAL | Status: DC | PRN

## 2012-05-24 MED ORDER — SENNOSIDES-DOCUSATE SODIUM 8.6-50 MG PO TABS: 2.0000 | ORAL_TABLET | Freq: Every day | ORAL | Status: DC

## 2012-05-24 MED ORDER — IBUPROFEN 800 MG PO TABS
800.0000 mg | ORAL_TABLET | Freq: Three times a day (TID) | ORAL | Status: DC | PRN
  Administered 2012-05-24 – 2012-05-25 (×4): 800 mg via ORAL
  Filled 2012-05-24 (×4): qty 1

## 2012-05-24 MED ORDER — BISACODYL 10 MG RE SUPP: 10.0000 mg | Freq: Every day | RECTAL | Status: DC | PRN

## 2012-05-24 MED ORDER — LACTATED RINGERS IV SOLN: INTRAVENOUS | Status: DC

## 2012-05-24 MED ORDER — LANOLIN HYDROUS EX OINT: TOPICAL_OINTMENT | CUTANEOUS | Status: DC | PRN

## 2012-05-24 MED ORDER — WITCH HAZEL-GLYCERIN EX PADS: 1.0000 "application " | MEDICATED_PAD | CUTANEOUS | Status: DC | PRN

## 2012-05-24 MED ORDER — PRENATAL MULTIVITAMIN CH
1.0000 | ORAL_TABLET | Freq: Every day | ORAL | Status: DC
  Administered 2012-05-24 – 2012-05-25 (×2): 1 via ORAL
  Filled 2012-05-24 (×2): qty 1

## 2012-05-24 MED ORDER — ONDANSETRON HCL 4 MG PO TABS: 4.0000 mg | ORAL_TABLET | ORAL | Status: DC | PRN

## 2012-05-24 MED ORDER — TETANUS-DIPHTH-ACELL PERTUSSIS 5-2.5-18.5 LF-MCG/0.5 IM SUSP: 0.5000 mL | Freq: Once | INTRAMUSCULAR | Status: DC

## 2012-05-24 NOTE — Anesthesia Postprocedure Evaluation (Signed)
  Anesthesia Post-op Note  Patient: Tammy Roman     Anesthesia Post Note  Patient: Tammy Roman  Procedure(s) Performed: * No procedures listed *  Anesthesia type: Spinal  Patient location: Mother Baby  Post pain: Pain level controlled  Post assessment: Post-op Vital signs reviewed  Last Vitals: BP 104/64  Pulse 106  Temp 36.8 C (Oral)  Resp 18  Ht 5\' 7"  (1.702 m)  Wt 290 lb (131.543 kg)  BMI 45.42 kg/m2  SpO2 97%  LMP 03/06/2011  Breastfeeding? Unknown  Post vital signs: Reviewed  Level of consciousness: awake  Complications: No apparent anesthesia complications

## 2012-05-24 NOTE — Addendum Note (Signed)
Addendum  created 05/24/12 0851 by Krishan Mcbreen O Cadance Raus, CRNA   Modules edited:Charges VN, Notes Section    

## 2012-05-24 NOTE — Addendum Note (Signed)
Addendum  created 05/24/12 4540 by Graciela Husbands, CRNA   Modules edited:Charges VN, Notes Section

## 2012-05-24 NOTE — Progress Notes (Signed)
Post Partum Day 1 Subjective: no complaints  Objective: Blood pressure 104/64, pulse 106, temperature 98.2 F (36.8 C), temperature source Oral, resp. rate 18, height 5\' 7"  (1.702 m), weight 290 lb (131.543 kg), last menstrual period 03/06/2011, SpO2 97.00%, unknown if currently breastfeeding.  Physical Exam:  General: alert Lochia: appropriate Uterine Fundus: firm Incision: healing well DVT Evaluation: No evidence of DVT seen on physical exam.   Basename 05/24/12 0540 05/23/12 1319  HGB 10.8* 11.7*  HCT 33.4* 36.2    Assessment/Plan: Plan for discharge tomorrow   LOS: 1 day   Rc Amison M 05/24/2012, 10:26 AM

## 2012-05-25 ENCOUNTER — Ambulatory Visit (HOSPITAL_COMMUNITY)
Admit: 2012-05-25 | Discharge: 2012-05-25 | Disposition: A | Discharge status: Home / Self Care | Payer: 59 | Attending: Obstetrics & Gynecology | Admitting: Obstetrics & Gynecology

## 2012-05-25 ENCOUNTER — Other Ambulatory Visit (HOSPITAL_COMMUNITY): Payer: Self-pay | Admitting: Obstetrics & Gynecology

## 2012-05-25 ENCOUNTER — Inpatient Hospital Stay (HOSPITAL_COMMUNITY): Admission: RE | Admit: 2012-05-25 | Payer: 59 | Source: Ambulatory Visit

## 2012-05-25 DIAGNOSIS — M79609 Pain in unspecified limb: Secondary | ICD-10-CM | POA: Insufficient documentation

## 2012-05-25 DIAGNOSIS — O9089 Other complications of the puerperium, not elsewhere classified: Secondary | ICD-10-CM | POA: Insufficient documentation

## 2012-05-25 DIAGNOSIS — R609 Edema, unspecified: Secondary | ICD-10-CM

## 2012-05-25 MED ORDER — CYCLOBENZAPRINE HCL 10 MG PO TABS: 10.0000 mg | ORAL_TABLET | Freq: Three times a day (TID) | ORAL | Status: DC | PRN

## 2012-05-25 MED ORDER — OXYCODONE-ACETAMINOPHEN 5-325 MG PO TABS: 1.0000 | ORAL_TABLET | Freq: Four times a day (QID) | ORAL | Status: DC | PRN

## 2012-05-25 MED ORDER — IBUPROFEN 800 MG PO TABS: 800.0000 mg | ORAL_TABLET | Freq: Three times a day (TID) | ORAL | Status: DC | PRN

## 2012-05-25 NOTE — Plan of Care (Signed)
Problem: Discharge Progression Outcomes Goal: Activity appropriate for discharge plan Outcome: Not Met (add Reason) Pt having right hip pain, md aware. md discharged pt and scheduled pt for outpt MRI 1700 today.

## 2012-05-25 NOTE — Plan of Care (Signed)
Problem: Discharge Progression Outcomes Goal: Barriers To Progression Addressed/Resolved Outcome: Completed/Met Date Met:  05/25/12 Pt right hip is causing pt a lot of pain, md aware. MD discharged pt and scheduled pt to have MRI outpt today at 1700 at [facility].

## 2012-05-25 NOTE — Progress Notes (Signed)
Post Partum Day 2 Subjective: patient complains of a shooting pain arising from R hip with radicular pain into back of R thigh. No previous history of back pain. Pain started with attempt of  getting out of the bed. Flexeril started last pm, awaiting 8 am dose. Reports some relief with Flexeril. Reports no relief from K- pad. Denies any paraesthesias. Pain only with movement. 2nd stage of labor short~30 mins. No shoulder dystocia. Reports no difficulty with epidural  Objective: Blood pressure 127/68, pulse 102, temperature 98.3 F (36.8 C), temperature source Oral, resp. rate 18, height 5\' 7"  (1.702 m), weight 290 lb (131.543 kg), last menstrual period 03/06/2011, SpO2 95.00%, unknown if currently breastfeeding.  Physical Exam:  General: alert and cooperative Lochia: appropriate Uterine Fundus: firm Incision: perineum intact DVT Evaluation: No evidence of DVT seen on physical exam. Negative CVAT   No significant calf/ankle edema.   Basename 05/24/12 0540 05/23/12 1319  HGB 10.8* 11.7*  HCT 33.4* 36.2    Assessment/Plan: Observation. Continue with Flexeril, Percocet and Motrin.   LOS: 2 days   CURTIS,CAROL G 05/25/2012, 8:35 AM

## 2012-05-25 NOTE — Progress Notes (Signed)
Spoke with radiology.  Recommend jumping to MRI.   Will discharge and have pt present for MRI lumbar spine at 5pm tonight at [facility]. Pt will be managed accordingly from that point from [facility] or [facility].    Mitchel Honour, DO

## 2012-05-25 NOTE — Progress Notes (Signed)
Pt in pain with repositioning this am per Julio Sicks' note.  She was to try ambulation and I planned to check on her in a few hours.  She continues to have severe right-sided hip pain radiating down her leg with movement and ambulation.  She was seen by anesthesia and they do not believe her pain is related to her epidural.  She denies h/o this type of pain.  It came on suddenly when rising from bed on PPD#1.  It has not gone away.  When she is still her pain is improved.  She is unable to breast feed as planned secondary to her discomfort.  The Flexeril and Percocet only dull the pain and make her sleepy.  She is unable to bear weight without assistance.    PE: RLE without visible abnormality.  Light palpation of the right thigh is painful.  Right leg raise becomes unbearable at around 15 degrees.    Will speak with radiology to determine appropriate imaging.  Pt declines pain meds at this time.    Mitchel Honour, DO

## 2012-05-25 NOTE — Discharge Summary (Deleted)
Obstetric Discharge Summary Reason for Admission: onset of labor and rupture of membranes Prenatal Procedures: none Intrapartum Procedures: spontaneous vaginal delivery Postpartum Procedures: none Complications-Operative and Postpartum: none Hemoglobin  Date Value Range Status  05/24/2012 10.8* 12.0 - 15.0 g/dL Final     HCT  Date Value Range Status  05/24/2012 33.4* 36.0 - 46.0 % Final    Physical Exam:  General: alert Lochia: appropriate Uterine Fundus: firm Incision: healing well DVT Evaluation: No evidence of DVT seen on physical exam.  Discharge Diagnoses: Term Pregnancy-delivered  Discharge Information: Date: 05/25/2012 Activity: pelvic rest Diet: routine Medications: PNV and Ibuprofen Condition: stable Instructions: refer to practice specific booklet Discharge to: home   Newborn Data: Live born female  Birth Weight: 8 lb 9 oz (3884 g) APGAR: 9, 9  Home with mother.  Tammy Roman 05/25/2012, 4:05 AM

## 2012-05-26 ENCOUNTER — Encounter (HOSPITAL_COMMUNITY): Payer: Self-pay | Admitting: Anesthesiology

## 2012-05-26 ENCOUNTER — Inpatient Hospital Stay (HOSPITAL_COMMUNITY)
Admission: AD | Admit: 2012-05-26 | Discharge: 2012-05-30 | Disposition: A | Discharge status: Home / Self Care | Payer: 59 | Source: Other Acute Inpatient Hospital | Attending: Internal Medicine | Admitting: Internal Medicine

## 2012-05-26 ENCOUNTER — Encounter (HOSPITAL_COMMUNITY): Payer: Self-pay | Admitting: *Deleted

## 2012-05-26 ENCOUNTER — Observation Stay (HOSPITAL_COMMUNITY)
Admission: AD | Admit: 2012-05-26 | Discharge: 2012-05-26 | Disposition: A | Discharge status: Home / Self Care | Payer: 59 | Source: Ambulatory Visit | Attending: Obstetrics & Gynecology | Admitting: Obstetrics & Gynecology

## 2012-05-26 ENCOUNTER — Inpatient Hospital Stay (HOSPITAL_COMMUNITY): Payer: 59 | Admitting: Anesthesiology

## 2012-05-26 ENCOUNTER — Ambulatory Visit (HOSPITAL_COMMUNITY): Admission: RE | Admit: 2012-05-26 | Payer: 59 | Source: Ambulatory Visit

## 2012-05-26 ENCOUNTER — Inpatient Hospital Stay (HOSPITAL_COMMUNITY): Payer: 59

## 2012-05-26 DIAGNOSIS — O99893 Other specified diseases and conditions complicating puerperium: Secondary | ICD-10-CM | POA: Insufficient documentation

## 2012-05-26 DIAGNOSIS — H9209 Otalgia, unspecified ear: Secondary | ICD-10-CM

## 2012-05-26 DIAGNOSIS — R030 Elevated blood-pressure reading, without diagnosis of hypertension: Secondary | ICD-10-CM

## 2012-05-26 DIAGNOSIS — M79609 Pain in unspecified limb: Secondary | ICD-10-CM

## 2012-05-26 DIAGNOSIS — R6510 Systemic inflammatory response syndrome (SIRS) of non-infectious origin without acute organ dysfunction: Secondary | ICD-10-CM

## 2012-05-26 DIAGNOSIS — A419 Sepsis, unspecified organism: Secondary | ICD-10-CM | POA: Diagnosis present

## 2012-05-26 DIAGNOSIS — D649 Anemia, unspecified: Secondary | ICD-10-CM

## 2012-05-26 DIAGNOSIS — H6120 Impacted cerumen, unspecified ear: Secondary | ICD-10-CM

## 2012-05-26 DIAGNOSIS — D72829 Elevated white blood cell count, unspecified: Secondary | ICD-10-CM

## 2012-05-26 DIAGNOSIS — E876 Hypokalemia: Secondary | ICD-10-CM

## 2012-05-26 LAB — CBC
HCT: 34 % — ABNORMAL LOW (ref 36.0–46.0)
Hemoglobin: 11 g/dL — ABNORMAL LOW (ref 12.0–15.0)
RDW: 15.1 % (ref 11.5–15.5)
WBC: 14.1 10*3/uL — ABNORMAL HIGH (ref 4.0–10.5)

## 2012-05-26 MED ORDER — SODIUM CHLORIDE 0.9 % IV SOLN
INTRAVENOUS | Status: DC
  Administered 2012-05-27: 22:00:00 via INTRAVENOUS
  Administered 2012-05-29: 20 mL via INTRAVENOUS

## 2012-05-26 MED ORDER — IBUPROFEN 600 MG PO TABS: 600.0000 mg | ORAL_TABLET | Freq: Four times a day (QID) | ORAL | Status: DC | PRN

## 2012-05-26 MED ORDER — DIPHENHYDRAMINE HCL 12.5 MG/5ML PO ELIX
12.5000 mg | ORAL_SOLUTION | Freq: Four times a day (QID) | ORAL | Status: DC | PRN
  Filled 2012-05-26: qty 5

## 2012-05-26 MED ORDER — FENTANYL CITRATE 0.05 MG/ML IJ SOLN: 50.0000 ug | Freq: Once | INTRAMUSCULAR | Status: DC

## 2012-05-26 MED ORDER — ONDANSETRON HCL 4 MG/2ML IJ SOLN: 4.0000 mg | Freq: Four times a day (QID) | INTRAMUSCULAR | Status: DC | PRN

## 2012-05-26 MED ORDER — HYDROMORPHONE 0.3 MG/ML IV SOLN
INTRAVENOUS | Status: DC
  Administered 2012-05-26 – 2012-05-27 (×2): via INTRAVENOUS
  Administered 2012-05-27: 0.4 mg via INTRAVENOUS
  Administered 2012-05-27: 2.19 mg via INTRAVENOUS
  Filled 2012-05-26 (×2): qty 25

## 2012-05-26 MED ORDER — DOCUSATE SODIUM 100 MG PO CAPS
100.0000 mg | ORAL_CAPSULE | Freq: Every day | ORAL | Status: DC
  Administered 2012-05-26: 100 mg via ORAL
  Filled 2012-05-26: qty 1

## 2012-05-26 MED ORDER — HYDROMORPHONE HCL PF 1 MG/ML IJ SOLN: 0.5000 mg | INTRAMUSCULAR | Status: DC | PRN

## 2012-05-26 MED ORDER — CLINDAMYCIN PHOSPHATE 300 MG/50ML IV SOLN: 300.0000 mg | Freq: Four times a day (QID) | INTRAVENOUS | Status: DC

## 2012-05-26 MED ORDER — CLINDAMYCIN PHOSPHATE 600 MG/50ML IV SOLN
600.0000 mg | Freq: Four times a day (QID) | INTRAVENOUS | Status: DC
  Administered 2012-05-27 (×4): 600 mg via INTRAVENOUS
  Filled 2012-05-26 (×6): qty 50

## 2012-05-26 MED ORDER — PIPERACILLIN-TAZOBACTAM 4.5 G IVPB
4.5000 g | Freq: Once | INTRAVENOUS | Status: AC
  Administered 2012-05-26: 4.5 g via INTRAVENOUS
  Filled 2012-05-26: qty 100

## 2012-05-26 MED ORDER — FENTANYL CITRATE 0.05 MG/ML IJ SOLN
50.0000 ug | Freq: Once | INTRAMUSCULAR | Status: AC
  Administered 2012-05-26: 50 ug via INTRAVENOUS
  Filled 2012-05-26: qty 2

## 2012-05-26 MED ORDER — DIPHENHYDRAMINE HCL 50 MG/ML IJ SOLN: 12.5000 mg | Freq: Four times a day (QID) | INTRAMUSCULAR | Status: DC | PRN

## 2012-05-26 MED ORDER — SODIUM CHLORIDE 0.9 % IV SOLN
2000.0000 mg | Freq: Once | INTRAVENOUS | Status: AC
  Administered 2012-05-26: 2000 mg via INTRAVENOUS
  Filled 2012-05-26: qty 2000

## 2012-05-26 MED ORDER — LACTATED RINGERS IV SOLN
INTRAVENOUS | Status: DC
  Administered 2012-05-26: 10:00:00 via INTRAVENOUS

## 2012-05-26 MED ORDER — MIDAZOLAM HCL 2 MG/2ML IJ SOLN
1.0000 mg | Freq: Once | INTRAMUSCULAR | Status: DC
  Filled 2012-05-26: qty 1

## 2012-05-26 MED ORDER — HYDROMORPHONE HCL PF 1 MG/ML IJ SOLN
INTRAMUSCULAR | Status: AC
  Administered 2012-05-26: 2 mg
  Filled 2012-05-26: qty 2

## 2012-05-26 MED ORDER — GADOBENATE DIMEGLUMINE 529 MG/ML IV SOLN: 20.0000 mL | Freq: Once | INTRAVENOUS | Status: DC

## 2012-05-26 MED ORDER — ACETAMINOPHEN 325 MG PO TABS: 650.0000 mg | ORAL_TABLET | ORAL | Status: DC | PRN

## 2012-05-26 MED ORDER — NALOXONE HCL 0.4 MG/ML IJ SOLN: 0.4000 mg | INTRAMUSCULAR | Status: DC | PRN

## 2012-05-26 MED ORDER — HYDROMORPHONE HCL 2 MG PO TABS
2.0000 mg | ORAL_TABLET | ORAL | Status: DC | PRN
  Administered 2012-05-26 (×2): 2 mg via ORAL
  Filled 2012-05-26 (×2): qty 1

## 2012-05-26 MED ORDER — PRENATAL MULTIVITAMIN CH
1.0000 | ORAL_TABLET | Freq: Every day | ORAL | Status: DC
  Administered 2012-05-26: 1 via ORAL
  Filled 2012-05-26: qty 1

## 2012-05-26 MED ORDER — VANCOMYCIN HCL 10 G IV SOLR
1500.0000 mg | Freq: Two times a day (BID) | INTRAVENOUS | Status: DC
  Administered 2012-05-27 – 2012-05-29 (×5): 1500 mg via INTRAVENOUS
  Filled 2012-05-26 (×6): qty 1500

## 2012-05-26 MED ORDER — HYDROMORPHONE HCL PF 1 MG/ML IJ SOLN: 1.0000 mg | Freq: Once | INTRAMUSCULAR | Status: DC

## 2012-05-26 MED ORDER — PIPERACILLIN-TAZOBACTAM 3.375 G IVPB
3.3750 g | Freq: Three times a day (TID) | INTRAVENOUS | Status: DC
  Administered 2012-05-27 – 2012-05-29 (×7): 3.375 g via INTRAVENOUS
  Filled 2012-05-26 (×9): qty 50

## 2012-05-26 MED ORDER — ZOLPIDEM TARTRATE 5 MG PO TABS: 5.0000 mg | ORAL_TABLET | Freq: Every evening | ORAL | Status: DC | PRN

## 2012-05-26 MED ORDER — CYCLOBENZAPRINE HCL 10 MG PO TABS
10.0000 mg | ORAL_TABLET | Freq: Three times a day (TID) | ORAL | Status: DC | PRN
  Filled 2012-05-26: qty 1

## 2012-05-26 MED ORDER — SODIUM CHLORIDE 0.9 % IV SOLN: 1.0000 mg/h | Freq: Once | INTRAVENOUS | Status: DC

## 2012-05-26 MED ORDER — CALCIUM CARBONATE ANTACID 500 MG PO CHEW: 2.0000 | CHEWABLE_TABLET | ORAL | Status: DC | PRN

## 2012-05-26 MED ORDER — SODIUM CHLORIDE 0.9 % IJ SOLN: 9.0000 mL | INTRAMUSCULAR | Status: DC | PRN

## 2012-05-26 MED ORDER — HYDROMORPHONE HCL PF 1 MG/ML IJ SOLN
1.0000 mg | Freq: Once | INTRAMUSCULAR | Status: AC
  Administered 2012-05-26: 1 mg via INTRAVENOUS
  Filled 2012-05-26: qty 1

## 2012-05-26 MED ORDER — SODIUM CHLORIDE 0.9 % IJ SOLN
3.0000 mL | Freq: Two times a day (BID) | INTRAMUSCULAR | Status: DC
  Administered 2012-05-27 (×2): 3 mL via INTRAVENOUS

## 2012-05-26 NOTE — Consult Note (Signed)
NEURO HOSPITALIST CONSULT NOTE    Reason for Consult: Right leg pain  HPI:                                                                                                                                          Tammy Roman is an 32 y.o. female who gave vaginal birth on 05/23/2012. Post birth patient was doing well, walking to the bathroom.  Later in the night she noted her right leg seemed slightly heavier than usual.  24 hours later she noted significant right lateral hip pain that would radiate down the back of her leg to her foot.  She describes the pain as "stabbing, starting in the back of her buttock region traveling down the back of her leg to her foot".  At this time any movement of her right leg other than dorsiflexion for her ankle causes pain. She has no B/B dysfunction.   Past Medical History  Diagnosis Date  . Obesity   . HSV-2 (herpes simplex virus 2) infection   . No pertinent past medical history     No past surgical history on file.  Family History  Problem Relation Age of Onset  . Hyperlipidemia Mother   . Hypertension Mother   . Hyperlipidemia Father   . Hypertension Father   . Prostate cancer Father   . Cancer Father     prostate  . Breast cancer Paternal Aunt     metastasized      Social History:  reports that she has never smoked. She has never used smokeless tobacco. She reports that she does not drink alcohol or use illicit drugs.  No Known Allergies  MEDICATIONS:                                                                                                                     Scheduled:   . docusate sodium  100 mg Oral Daily  . prenatal multivitamin  1 tablet Oral Daily   WUJ:[id number], calcium carbonate, cyclobenzaprine, HYDROmorphone (DILAUDID) injection, HYDROmorphone, ibuprofen, zolpidem   ROS:  History obtained from the patient  General ROS: negative for - chills, fatigue, fever, night sweats, weight gain or weight loss Psychological ROS: negative for - behavioral disorder, hallucinations, memory difficulties, mood swings or suicidal ideation Ophthalmic ROS: negative for - blurry vision, double vision, eye pain or loss of vision ENT ROS: negative for - epistaxis, nasal discharge, oral lesions, sore throat, tinnitus or vertigo Allergy and Immunology ROS: negative for - hives or itchy/watery eyes Hematological and Lymphatic ROS: negative for - bleeding problems, bruising or swollen lymph nodes Endocrine ROS: negative for - galactorrhea, hair pattern changes, polydipsia/polyuria or temperature intolerance Respiratory ROS: negative for - cough, hemoptysis, shortness of breath or wheezing Cardiovascular ROS: negative for - chest pain, dyspnea on exertion, edema or irregular heartbeat Gastrointestinal ROS: negative for - abdominal pain, diarrhea, hematemesis, nausea/vomiting or stool incontinence Genito-Urinary ROS: negative for - dysuria, hematuria, incontinence or urinary frequency/urgency Musculoskeletal ROS: negative for - joint swelling or muscular weakness Neurological ROS: as noted in HPI Dermatological ROS: negative for rash and skin lesion changes   Blood pressure 111/75, pulse 120, temperature 100.6 F (38.1 C), temperature source Oral, resp. rate 22, last menstrual period 03/06/2011, SpO2 95.00%.   Neurologic Examination:                                                                                                      Mental Status: Alert, oriented, thought content appropriate.  Speech fluent without evidence of aphasia.  Able to follow 3 step commands without difficulty. Cranial Nerves: II: Discs flat bilaterally; Visual fields grossly normal, pupils equal, round, reactive to light and accommodation III,IV, VI: ptosis not present, extra-ocular motions  intact bilaterally V,VII: smile symmetric, facial light touch sensation normal bilaterally VIII: hearing normal bilaterally IX,X: gag reflex present XI: bilateral shoulder shrug XII: midline tongue extension Motor: Right : Upper extremity   5/5    Left:     Upper extremity   5/5  Lower extremity  See note    Lower extremity   5/5 --patient would not move her right hip in any directions due to pain.  She also would not move her right knee secondary to pain.  Right DF/PF are 5/5. Sensation is fully intact.  Tone and bulk:normal tone throughout; no atrophy noted Sensory: Pinprick and light touch intact throughout, bilaterally Deep Tendon Reflexes: 2+ and symmetric throughout UE, depressed bilateral knees, 2+ bilateral AJ Plantars: Right: downgoing   Left: downgoing Cerebellar: normal finger-to-nose,   CV: pulses palpable throughout     Lab Results  Component Value Date/Time   CHOL 159 03/05/2011  4:19 PM    Results for orders placed during the hospital encounter of 05/26/12 (from the past 48 hour(s))  CBC     Status: Abnormal   Collection Time   05/26/12  1:10 AM      Component Value Range Comment   WBC 14.1 (*) 4.0 - 10.5 K/uL    RBC 4.05  3.87 - 5.11 MIL/uL    Hemoglobin 11.0 (*) 12.0 - 15.0 g/dL    HCT 11.9 (*) 14.7 -  46.0 %    MCV 84.0  78.0 - 100.0 fL    MCH 27.2  26.0 - 34.0 pg    MCHC 32.4  30.0 - 36.0 g/dL    RDW 16.1  09.6 - 04.5 %    Platelets 170  150 - 400 K/uL   APTT     Status: Normal   Collection Time   05/26/12  1:10 AM      Component Value Range Comment   aPTT 31  24 - 37 seconds   PROTIME-INR     Status: Normal   Collection Time   05/26/12  1:10 AM      Component Value Range Comment   Prothrombin Time 12.5  11.6 - 15.2 seconds    INR 0.94  0.00 - 1.49     Mr Lumbar Spine Wo Contrast  05/25/2012  *RADIOLOGY REPORT*  Clinical Data: 2 days post vaginal birth.  Extreme pain down right leg for past 24 hours.  MRI LUMBAR SPINE WITHOUT CONTRAST  Technique:   Multiplanar and multiecho pulse sequences of the lumbar spine were obtained without intravenous contrast.  Comparison: None.  Findings: Last fully open disc space is labeled L5-S1.  Present examination incorporates from T9-10 through the S3 level.  Conus L1-2 level.  Signal drop-off T9-L1 limits evaluation.  No gross abnormality of the distal cord or conus is noted.  Decreased signal intensity of bone marrow most likely related to the recent postpartum state.  Underlying anemia not excluded.  No evidence of right-sided disc herniation.  At the L5-S1 level, there is a mild bulge/shallow protrusion without mass effect.  Nonspecific edema/hemorrhage anterior to the right aspect of the sacrum involving the right iliacus and psoas muscle.  This may represent hemorrhage from recent delivery. Nerve root injury with leakage of cerebrospinal fluid or result of venous thrombosis not excluded  although would be unusual.  Infection not entirely excluded in the proper clinical setting.  MR pelvis can be obtained for further delineation.  Enlarged uterus consistent with recent postpartum state.  IMPRESSION: Evaluation of the distal cord and conus is limited without gross abnormality detected.  No right-sided disc herniation causing nerve root compression identified.  Nonspecific edema/hemorrhage anterior to the right aspect of the sacrum involving the right iliacus and psoas muscle.  Please see above. M r pelvis to be obtained for further delineation.  Critical Value/emergent results were called by telephone at the time of interpretation on 05/25/2012 at 6:15 p.m. to Dr. Langston Masker, who verbally acknowledged these results.   Original Report Authenticated By: Lacy Duverney, M.D.      Assessment/Plan: 32 YO female with 3 day history of right leg pain localized in the right lateral hip and has some radiation down posterior leg.  Strength limited by pain.  No sensory level or dermatome, normal DTR, no central cord signs.  Etiology  does not seem to be from spinal cord or upper spinal cord. Distribution of pain does not follow a radicular pattern.    Recommend: 1) MRI pelvis w/wo contrast 2) MRI thoracic w/wo contrast 3)PT/OT 4) pain control  Plan discussed with on attending and they are in agreement.    Felicie Morn PA-C [facility] [phone]  05/26/2012, 10:12 AM   Patient seen and examined together with physician assistant and I concur with the assessment and plan.  Wyatt Portela, MD

## 2012-05-26 NOTE — H&P (Signed)
Tammy Roman is an 32 y.o. female PPD#3 s/p uncomplicated SVD admitted for pain control and further evaluation of fluid collection in the pelvis.  Patient was discharged today with arrangements made for MRI lumbar spine secondary to sudden onset RLE pain (see previous notes for more details).  Edema/hemorrhage anterior to the right sacrum was noted but without source.  The pain began 24 hours after delivery and has stayed stable; not worsening.  She denies leg weakness but is unable to bear weight secondary to pain.     Past Medical History  Diagnosis Date  . Obesity   . HSV-2 (herpes simplex virus 2) infection   . No pertinent past medical history     No past surgical history on file.  Family History  Problem Relation Age of Onset  . Hyperlipidemia Mother   . Hypertension Mother   . Hyperlipidemia Father   . Hypertension Father   . Prostate cancer Father   . Cancer Father     prostate  . Breast cancer Paternal Aunt     metastasized     Social History:  reports that she has never smoked. She has never used smokeless tobacco. She reports that she does not drink alcohol or use illicit drugs.  Allergies: No Known Allergies  Prescriptions prior to admission  Medication Sig Dispense Refill  . cyclobenzaprine (FLEXERIL) 10 MG tablet Take 1 tablet (10 mg total) by mouth every 8 (eight) hours as needed for muscle spasms.  30 tablet  0  . ibuprofen (ADVIL,MOTRIN) 800 MG tablet Take 1 tablet (800 mg total) by mouth every 8 (eight) hours as needed.  30 tablet  0  . oxyCODONE-acetaminophen (PERCOCET/ROXICET) 5-325 MG per tablet Take 1-2 tablets by mouth every 6 (six) hours as needed.  30 tablet  0  . Prenatal Vit-Fe Fumarate-FA (PRENATAL MULTIVITAMIN) TABS Take 1 tablet by mouth every morning.      . valACYclovir (VALTREX) 500 MG tablet Take 1 tablet (500 mg total) by mouth daily.  30 tablet  3    Review of Systems  Constitutional: Negative for fever and chills.  Respiratory:  Negative for cough.   Cardiovascular: Negative for chest pain and palpitations.  Gastrointestinal: Negative for nausea, vomiting and abdominal pain.  Genitourinary: Negative for dysuria.  Musculoskeletal: Positive for back pain and joint pain.  Skin: Negative for itching and rash.  Neurological: Negative for dizziness and headaches.  Endo/Heme/Allergies: Does not bruise/bleed easily.    Last menstrual period 03/06/2011. Physical Exam Unchanged from previous  No results found for this or any previous visit (from the past 24 hour(s)).  Mr Lumbar Spine Wo Contrast  05/25/2012  *RADIOLOGY REPORT*  Clinical Data: 2 days post vaginal birth.  Extreme pain down right leg for past 24 hours.  MRI LUMBAR SPINE WITHOUT CONTRAST  Technique:  Multiplanar and multiecho pulse sequences of the lumbar spine were obtained without intravenous contrast.  Comparison: None.  Findings: Last fully open disc space is labeled L5-S1.  Present examination incorporates from T9-10 through the S3 level.  Conus L1-2 level.  Signal drop-off T9-L1 limits evaluation.  No gross abnormality of the distal cord or conus is noted.  Decreased signal intensity of bone marrow most likely related to the recent postpartum state.  Underlying anemia not excluded.  No evidence of right-sided disc herniation.  At the L5-S1 level, there is a mild bulge/shallow protrusion without mass effect.  Nonspecific edema/hemorrhage anterior to the right aspect of the sacrum involving the right iliacus  and psoas muscle.  This may represent hemorrhage from recent delivery. Nerve root injury with leakage of cerebrospinal fluid or result of venous thrombosis not excluded  although would be unusual.  Infection not entirely excluded in the proper clinical setting.  MR pelvis can be obtained for further delineation.  Enlarged uterus consistent with recent postpartum state.  IMPRESSION: Evaluation of the distal cord and conus is limited without gross abnormality  detected.  No right-sided disc herniation causing nerve root compression identified.  Nonspecific edema/hemorrhage anterior to the right aspect of the sacrum involving the right iliacus and psoas muscle.  Please see above. M r pelvis to be obtained for further delineation.  Critical Value/emergent results were called by telephone at the time of interpretation on 05/25/2012 at 6:15 p.m. to Dr. Langston Masker, who verbally acknowledged these results.   Original Report Authenticated By: Lacy Duverney, M.D.     Assessment/Plan: 31yo G2P2 PPD#3 s/p SVD with RLE pain -MRI pelvis tomorrow at 4pm at [facility] -Pain control with Dilaudid and Flexeril -CBC, coags -TED hose for DVT ppx  [name] 05/26/2012, 1:08 AM

## 2012-05-26 NOTE — Progress Notes (Signed)
ANTIBIOTIC CONSULT NOTE - INITIAL  Pharmacy Consult for vancomycin and zosyn Indication: suspected intrapelvic infection post birth   No Known Allergies  Patient Measurements: Height: 5\' 7"  (170.2 cm) Weight: 290 lb (131.543 kg) IBW/kg (Calculated) : 61.6    Vital Signs: Temp: 102.3 F (39.1 C) (01/21 1824) Temp src: Oral (01/21 1824) BP: 126/60 mmHg (01/21 1824) Pulse Rate: 129  (01/21 1824) Intake/Output from previous day:   Intake/Output from this shift:    Labs:  Basename 05/26/12 0110 05/24/12 0540  WBC 14.1* 19.7*  HGB 11.0* 10.8*  PLT 170 163  LABCREA -- --  CREATININE -- --   Estimated Creatinine Clearance: 129.3 ml/min (by C-G formula based on Cr of 0.9). No results found for this basename: VANCOTROUGH:2,VANCOPEAK:2,VANCORANDOM:2,GENTTROUGH:2,GENTPEAK:2,GENTRANDOM:2,TOBRATROUGH:2,TOBRAPEAK:2,TOBRARND:2,AMIKACINPEAK:2,AMIKACINTROU:2,AMIKACIN:2, in the last 72 hours   Microbiology: No results found for this or any previous visit (from the past 720 hour(s)).  Medical History: Past Medical History  Diagnosis Date  . Obesity   . HSV-2 (herpes simplex virus 2) infection   . No pertinent past medical history     Medications:  Prescriptions prior to admission  Medication Sig Dispense Refill  . cyclobenzaprine (FLEXERIL) 10 MG tablet Take 10 mg by mouth every 8 (eight) hours as needed. For muscle spasms.      Marland Kitchen oxyCODONE-acetaminophen (PERCOCET/ROXICET) 5-325 MG per tablet Take 1-2 tablets by mouth every 6 (six) hours as needed. For pain.      . Prenatal Vit-Fe Fumarate-FA (PRENATAL MULTIVITAMIN) TABS Take 1 tablet by mouth every morning.      . [DISCONTINUED] cyclobenzaprine (FLEXERIL) 10 MG tablet Take 1 tablet (10 mg total) by mouth every 8 (eight) hours as needed for muscle spasms.  30 tablet  0  . [DISCONTINUED] oxyCODONE-acetaminophen (PERCOCET/ROXICET) 5-325 MG per tablet Take 1-2 tablets by mouth every 6 (six) hours as needed.  30 tablet  0  . ibuprofen  (ADVIL,MOTRIN) 800 MG tablet Take 800 mg by mouth every 8 (eight) hours as needed. For pain.      . valACYclovir (VALTREX) 500 MG tablet Take 1 tablet (500 mg total) by mouth daily.  30 tablet  3  . [DISCONTINUED] ibuprofen (ADVIL,MOTRIN) 800 MG tablet Take 1 tablet (800 mg total) by mouth every 8 (eight) hours as needed.  30 tablet  0   Assessment: Mrs. Papa is a 32 yo F transferred from [facility] to start vancomycin and zosyn for suspected intrapelvic infection post birth 3 days ago. Her temp is 102.3 and her WBC has come down to 14.1 from 19.7.  Her HR is 129.  Her wt is 131.5 kg.  Her renal function is good.    Goal of Therapy:  Vancomycin trough level 15-20 mcg/ml  Plan:  1. Vancomycin 2000 mg IV load as ordered per MD, then vancomycin 1500 mg IV q12h 2. Zosyn 4.5 gm load, then zosyn 3.375 gm IV q8h, infuse each dose over 4 hours 3. F/u renal function and culture data. Herby Abraham, Pharm.D. [phone] 05/26/2012 7:27 PM

## 2012-05-26 NOTE — Progress Notes (Signed)
CSW aware of pt's transfer to [place]. Infant will discharge to FOB, Tammy Roman or pt's mother, Tammy Roman. "Discharge to other than mother" form placed in infants chart. CSW available to assist if needed.      

## 2012-05-26 NOTE — Progress Notes (Signed)
Discussed plan of care with patient, Dr. Rana Snare and Dr. Arby Barrette.  Patient agreed to try 1 mg of Dilaudid and 50 mcg of Fentanyl just prior to transfer to stretcher.  [facility] notified of plan and dispatched unit to arrive in 45 to 50 minutes.  Helene Shoe, RN came from PACU to administer Fentanyl and Dilaudid.  Meds were given upon arrival of [facility] and patient transferred to stretcher with assistance of Hover mat. Dr. Rana Snare was present at time of transfer.  Patient tolerated transfer well.  I spoke to patient following transfer and she said the transfer was OK and the medication worked pretty well.  I called MRI to notify them that the patient was on her way.  Report called by Rodrigo Ran, RN to patient's nurse on [address]. Patient left unit via stretcher with [facility] at 1745.

## 2012-05-26 NOTE — Progress Notes (Signed)
Post Partum Day 3 Subjective: tolerating PO and complains of significant R hip pain with radiation into R leg to foot. Patient is unable to ambulate. Reports has not voided since yesterday. Foley catheter was attempted earlier this am without success  Objective: Blood pressure 111/75, pulse 120, temperature 100.6 F (38.1 C), temperature source Oral, resp. rate 22, last menstrual period 03/06/2011, SpO2 95.00%.  Physical Exam:  General: alert and moderate distress Lochia: appropriate Uterine Fundus: firm Incision: healing well DVT Evaluation: L leg without evidence of swelling , negative Homan's. R leg no evidence of edema, severe pain with movement unable to flex foot Lungs with questionable rales in L lower lobe  Breast without erythema, no tenderness  Basename 05/26/12 0110 05/24/12 0540  HGB 11.0* 10.8*  HCT 34.0* 33.4*    Assessment/Plan: Insert foley. Send urine for c and s IV -LR @125  cc/hr Neuro consult  Dilaudid IV prn, after neuro exam   LOS: 0 days   CURTIS,CAROL G 05/26/2012, 9:14 AM

## 2012-05-26 NOTE — Progress Notes (Signed)
Attempted to insert foley catheter, pt. unable to tolerate procedure due to R flank pain. Pt. Requested for MD to be notified due to increased pain.

## 2012-05-26 NOTE — Progress Notes (Addendum)
Patient ID: Tammy Roman, female   DOB: 09-Dec-1980, 32 y.o.   MRN: [id number] Pt refuses to be transferred to stretcher for transfer to [facility] by ems without significant pain relief EMS present, Head of Nursing here (Beth), Dr Arby Barrette recommends versed (1mg ), Dilaudid 1mg , fentanyl 1cc ( ) EMS will monitor accordiing to conscious sedation protocol Discussion with patient and the above, pt agrees to try the above DL  After further review of hospital policies and privledges, Dr Arby Barrette has given the above orders (conscious sedation) DL  After further discussion with patient, the Pharmacy and ems, they were more comfortable without the versed and pt agreed DL

## 2012-05-26 NOTE — Progress Notes (Signed)
EMS here to pick up pt. Attempted to transfer pt to stretcher pt complained of pain to severe to move to stretcher. Pt. Offered pain med of dilaudid 2mg  po   Or IV. Pt continues to state that it doesn't work.  Pt refused to transfer to stretcher, until pain issue is resolved.  Dr. Rana Snare notified, who consulted with Dr. Lilli Light (anesthesia). OB, Unit manager, house coverage and Anesthesia MD discussing plan of care.

## 2012-05-26 NOTE — H&P (Addendum)
[facility] History and Physical  Tammy Roman ZOX:[id number] DOB: 10-16-1980 DOA: 05/26/2012  PCP: Nani Gasser, MD   Chief Complaint: Right leg pain  HPI: Tammy Roman is a 32 y.o. female  This is a 32 year old female without much past medical history coming in as a transfer from [employer] [facility]. She is status post vaginal birth on 05/23/2012. Post birth she was doing well, however about 24 hours later she started to complain of pain in her right leg. The pain spread to her right hip, back, and her right foot. She describes the pain as "excruciating". She denies any bowel or or bladder dysfunction. At [facility], neurology has been consulted. She had an MRI of the L-spine which showed that the spinal cord, although limited exam, is without gross abnormalities. The MRI also showed a nonspecific edema/hemorrhage anterior to the right aspect of the sacrum involving the right iliac and psoas muscles. She was transferred here for an MR of the pelvis.   Review of Systems: On review of system, patient endorses severe back pain and right hip pain and right leg pain. She endorses fevers. She has no abdominal complaints.   Past Medical History  Diagnosis Date  . Obesity   . HSV-2 (herpes simplex virus 2) infection   . No pertinent past medical history    No past surgical history on file. Social History:  reports that she has never smoked. She has never used smokeless tobacco. She reports that she does not drink alcohol or use illicit drugs. No Known Allergies  Family History  Problem Relation Age of Onset  . Hyperlipidemia Mother   . Hypertension Mother   . Hyperlipidemia Father   . Hypertension Father   . Prostate cancer Father   . Cancer Father     prostate  . Breast cancer Paternal Aunt     metastasized     Prior to Admission medications   Medication Sig Start Date End Date Taking? Authorizing Provider  cyclobenzaprine (FLEXERIL) 10 MG tablet Take 10 mg by  mouth every 8 (eight) hours as needed. For muscle spasms. 05/25/12  Yes Megan Morris, DO  oxyCODONE-acetaminophen (PERCOCET/ROXICET) 5-325 MG per tablet Take 1-2 tablets by mouth every 6 (six) hours as needed. For pain. 05/25/12  Yes Megan Morris, DO  Prenatal Vit-Fe Fumarate-FA (PRENATAL MULTIVITAMIN) TABS Take 1 tablet by mouth every morning.   Yes Historical Provider, MD  ibuprofen (ADVIL,MOTRIN) 800 MG tablet Take 800 mg by mouth every 8 (eight) hours as needed. For pain. 05/25/12   Megan Morris, DO  valACYclovir (VALTREX) 500 MG tablet Take 1 tablet (500 mg total) by mouth daily. 10/21/11   Agapito Games, MD   Physical Exam: Filed Vitals:   05/26/12 8119 05/26/12 1824  BP: 128/76 126/60  Pulse: 102 129  Temp:  102.3 F (39.1 C)  TempSrc: Oral Oral  Resp: 20 18  Height:  5\' 7"  (1.702 m)  Weight:  131.543 kg (290 lb)  SpO2:  94%    General:  She is in distress secondary to pain  Eyes: Pupils equal round and reactive to light no scleral icterus  Neck: No JVD appreciated  Cardiovascular: Tachycardic, regular rate and rhythm no murmurs  Respiratory: Lungs clear to auscultation bilaterally  Abdomen: Soft mildly tender to palpation  Skin: No rashes  Musculoskeletal: No peripheral edema  Neurologic: Cranial nerves II through XII intact, her neuro exam is somewhat limited by her inability to move her lower extremities secondary to severe pain that  this creates.   CBC:  Lab 05/26/12 0110 05/24/12 0540 05/23/12 1319  WBC 14.1* 19.7* 11.7*  NEUTROABS -- -- --  HGB 11.0* 10.8* 11.7*  HCT 34.0* 33.4* 36.2  MCV 84.0 84.6 84.4  PLT 170 163 178   Radiological Exams on Admission: Mr Lumbar Spine Wo Contrast  05/25/2012  *RADIOLOGY REPORT*  Clinical Data: 2 days post vaginal birth.  Extreme pain down right leg for past 24 hours.  MRI LUMBAR SPINE WITHOUT CONTRAST  Technique:  Multiplanar and multiecho pulse sequences of the lumbar spine were obtained without intravenous  contrast.  Comparison: None.  Findings: Last fully open disc space is labeled L5-S1.  Present examination incorporates from T9-10 through the S3 level.  Conus L1-2 level.  Signal drop-off T9-L1 limits evaluation.  No gross abnormality of the distal cord or conus is noted.  Decreased signal intensity of bone marrow most likely related to the recent postpartum state.  Underlying anemia not excluded.  No evidence of right-sided disc herniation.  At the L5-S1 level, there is a mild bulge/shallow protrusion without mass effect.  Nonspecific edema/hemorrhage anterior to the right aspect of the sacrum involving the right iliacus and psoas muscle.  This may represent hemorrhage from recent delivery. Nerve root injury with leakage of cerebrospinal fluid or result of venous thrombosis not excluded  although would be unusual.  Infection not entirely excluded in the proper clinical setting.  MR pelvis can be obtained for further delineation.  Enlarged uterus consistent with recent postpartum state.  IMPRESSION: Evaluation of the distal cord and conus is limited without gross abnormality detected.  No right-sided disc herniation causing nerve root compression identified.  Nonspecific edema/hemorrhage anterior to the right aspect of the sacrum involving the right iliacus and psoas muscle.  Please see above. M r pelvis to be obtained for further delineation.  Critical Value/emergent results were called by telephone at the time of interpretation on 05/25/2012 at 6:15 p.m. to Dr. Langston Masker, who verbally acknowledged these results.   Original Report Authenticated By: Lacy Duverney, M.D.    EKG: Independently reviewed.   Assessment/Plan Active Problems:  Sepsis  1. Sepsis - with fever, tachycardia, and increased white blood cell count, possibly secondary to intrapelvic infection. Stat MR pelvis tonight. I started broad-spectrum antibiotics, vancomycin and Zosyn. She has already received 2 L of fluid before transfer per nursing  report, we'll continue IV fluids at 100 per hour. Her hemoglobin was stable before transfer, which suggests that her intrapelvic fluid might not be hemorrhage. We'll obtain blood cultures urinalysis, CBC, BMP, as well as lactic acid. She has no signs of toxic shock syndrome now, however will go ahead and also cover with Clindamycin.  2. Severe pain - we'll start a low-dose dilaudid PCA.  We may go up if needed.  3. Prophylaxis - we'll hold heparin until we make sure that she is not actively bleeding.  Code Status: Full Family Communication: husband in the room Disposition Plan: 3-4 days   Time spent: 43  Pamella Pert [facility] Pager [phone]  If 7PM-7AM, please contact night-coverage [email or web address] Password [facility] 05/26/2012, 7:07 PM

## 2012-05-27 ENCOUNTER — Encounter (HOSPITAL_COMMUNITY): Payer: Self-pay | Admitting: *Deleted

## 2012-05-27 DIAGNOSIS — M461 Sacroiliitis, not elsewhere classified: Secondary | ICD-10-CM

## 2012-05-27 DIAGNOSIS — E876 Hypokalemia: Secondary | ICD-10-CM | POA: Diagnosis not present

## 2012-05-27 DIAGNOSIS — D649 Anemia, unspecified: Secondary | ICD-10-CM

## 2012-05-27 DIAGNOSIS — A419 Sepsis, unspecified organism: Secondary | ICD-10-CM

## 2012-05-27 DIAGNOSIS — D72829 Elevated white blood cell count, unspecified: Secondary | ICD-10-CM

## 2012-05-27 LAB — URINE CULTURE: Colony Count: NO GROWTH

## 2012-05-27 LAB — CBC
MCHC: 33.4 g/dL (ref 30.0–36.0)
Platelets: 215 10*3/uL (ref 150–400)
RDW: 14.8 % (ref 11.5–15.5)

## 2012-05-27 LAB — COMPREHENSIVE METABOLIC PANEL
ALT: 18 U/L (ref 0–35)
Albumin: 1.7 g/dL — ABNORMAL LOW (ref 3.5–5.2)
Alkaline Phosphatase: 134 U/L — ABNORMAL HIGH (ref 39–117)
Potassium: 3.4 mEq/L — ABNORMAL LOW (ref 3.5–5.1)
Sodium: 136 mEq/L (ref 135–145)
Total Protein: 5.4 g/dL — ABNORMAL LOW (ref 6.0–8.3)

## 2012-05-27 LAB — TYPE AND SCREEN
ABO/RH(D): B POS
Antibody Screen: NEGATIVE
Unit division: 0

## 2012-05-27 MED ORDER — POTASSIUM CHLORIDE CRYS ER 20 MEQ PO TBCR
40.0000 meq | EXTENDED_RELEASE_TABLET | Freq: Once | ORAL | Status: AC
  Administered 2012-05-27: 40 meq via ORAL
  Filled 2012-05-27: qty 2

## 2012-05-27 MED ORDER — NALOXONE HCL 0.4 MG/ML IJ SOLN: 0.4000 mg | INTRAMUSCULAR | Status: DC | PRN

## 2012-05-27 MED ORDER — HYDROMORPHONE 0.3 MG/ML IV SOLN
INTRAVENOUS | Status: DC
  Administered 2012-05-27: 16:00:00 via INTRAVENOUS
  Administered 2012-05-27: 7.79 mg via INTRAVENOUS
  Administered 2012-05-27: 4 mg via INTRAVENOUS
  Administered 2012-05-28: 7 mg via INTRAVENOUS
  Administered 2012-05-28: 0.799 mg via INTRAVENOUS
  Administered 2012-05-28: 2.19 mg via INTRAVENOUS
  Administered 2012-05-28: 5.19 mg via INTRAVENOUS
  Administered 2012-05-28: 4 mg via INTRAVENOUS
  Administered 2012-05-29: 0.399 mg via INTRAVENOUS
  Administered 2012-05-29: 0.2 mg via INTRAVENOUS
  Administered 2012-05-29: 0.4 mg via INTRAVENOUS
  Filled 2012-05-27 (×3): qty 25

## 2012-05-27 MED ORDER — DIPHENHYDRAMINE HCL 50 MG/ML IJ SOLN: 12.5000 mg | Freq: Four times a day (QID) | INTRAMUSCULAR | Status: DC | PRN

## 2012-05-27 MED ORDER — DIPHENHYDRAMINE HCL 12.5 MG/5ML PO ELIX
12.5000 mg | ORAL_SOLUTION | Freq: Four times a day (QID) | ORAL | Status: DC | PRN
  Filled 2012-05-27: qty 5

## 2012-05-27 MED ORDER — ONDANSETRON HCL 4 MG/2ML IJ SOLN: 4.0000 mg | Freq: Four times a day (QID) | INTRAMUSCULAR | Status: DC | PRN

## 2012-05-27 MED ORDER — SODIUM CHLORIDE 0.9 % IJ SOLN: 9.0000 mL | INTRAMUSCULAR | Status: DC | PRN

## 2012-05-27 MED ORDER — IBUPROFEN 600 MG PO TABS
600.0000 mg | ORAL_TABLET | Freq: Four times a day (QID) | ORAL | Status: DC | PRN
  Administered 2012-05-27 – 2012-05-29 (×4): 600 mg via ORAL
  Filled 2012-05-27 (×4): qty 1

## 2012-05-27 NOTE — Progress Notes (Signed)
Paged MD to request PCA increase to full dose Dilaudid. Also to inform him of pt's elevated respiratory distress.  During the night she had been sleeping and did not use any of her PCA Dilaudid from 1230am to 0500. She denied distress during the night and only began complaining this am that her pain medicine was not working.

## 2012-05-27 NOTE — Progress Notes (Addendum)
Pt refuses to move and bathe

## 2012-05-27 NOTE — Progress Notes (Signed)
Pt states that Dilaudid PCA is not helping her pain.  On call provider paged to request increase to full dose PCA Dilaudid

## 2012-05-27 NOTE — Progress Notes (Signed)
UR completed 

## 2012-05-27 NOTE — Progress Notes (Signed)
Ob/Gyn Still severe pain with movement  Blood pressure 135/82, pulse 121, temperature 99.2 F (37.3 C), temperature source Oral, resp. rate 25, height 5\' 7"  (1.702 m), weight 290 lb (131.543 kg), last menstrual period 03/06/2011, SpO2 95.00%, not currently breastfeeding.  A: Sacroiliitis  P: Per ID      Please call if we can be of assistance      Office  [phone]

## 2012-05-27 NOTE — Consult Note (Signed)
INFECTIOUS DISEASE CONSULT NOTE  Date of Admission:  05/26/2012  Date of Consult:  05/27/2012  Reason for Consult: Sacroilitis Referring Physician: [email or web address]  Impression/Recommendation Sacroilitis Would- d/c clinda Ask IR to aspirate Comment- infectious sacroiliitis is extremely rare. There are case reports available as well as case reports of TB. An aspiration of her joint would be the most expeditious way to make this diagnosis.    Thank you so much for this interesting consult,   Johny Sax [phone]  Tammy Roman is an 32 y.o. female.  HPI: 32 yo F with hx of NSVD 1-18 who postpartum developed pain radiating down her R thigh and fever. The pain became severe enough that she was unable to walk. She was d/c from [facility] hospital and sent to MRI at [facility]. Her MRI: there is persistent asymmetric but nonspecific edema involving the right iliacus, piriformis and gluteus musculature. As this process abuts the right sacroiliac joint which demonstrates mild asymmetric hyperintensity,  early sacroiliitis is of concern (potentially infectious).  She was admitted by the hospitalists, has had temp 102.5 We are asked to comment on her sacroliitis.   She has been started on zosyn, clinda, vanco.   Past Medical History  Diagnosis Date  . Obesity   . HSV-2 (herpes simplex virus 2) infection   . No pertinent past medical history     History reviewed. No pertinent past surgical history.   No Known Allergies  Medications:  Scheduled:   . clindamycin (CLEOCIN) IV  600 mg Intravenous Q6H  . HYDROmorphone PCA 0.3 mg/mL   Intravenous Q4H  . piperacillin-tazobactam (ZOSYN)  IV  3.375 g Intravenous Q8H  . sodium chloride  3 mL Intravenous Q12H  . vancomycin  1,500 mg Intravenous Q12H    Total days of antibiotics 2         Social History:  reports that she has never smoked. She has never used smokeless tobacco. She reports that she does not drink alcohol or use illicit  drugs.  Family History  Problem Relation Age of Onset  . Hyperlipidemia Mother   . Hypertension Mother   . Hyperlipidemia Father   . Hypertension Father   . Prostate cancer Father   . Cancer Father     prostate  . Breast cancer Paternal Aunt     metastasized     General ROS: c/o pain, on right. none on left. no fever or chills. has had sweats. constipated. normal urination.   Blood pressure 123/54, pulse 122, temperature 99.9 F (37.7 C), temperature source Oral, resp. rate 31, height 5\' 7"  (1.702 m), weight 290 lb (131.543 kg), last menstrual period 03/06/2011, SpO2 98.00%, not currently breastfeeding. General appearance: alert, cooperative and mild distress Eyes: negative findings: pupils equal, round, reactive to light and accomodation Throat: normal findings: oropharynx pink & moist without lesions or evidence of thrush Neck: no adenopathy and supple, symmetrical, trachea midline Lungs: clear to auscultation bilaterally Heart: regular rate and rhythm Abdomen: normal findings: bowel sounds normal and soft, non-tender Extremities: edema non-pitting.  and no pain with eversion of L foot. exquisite pain with eversion of her R foot.    Results for orders placed during the hospital encounter of 05/26/12 (from the past 48 hour(s))  CBC     Status: Abnormal   Collection Time   05/27/12  9:05 AM      Component Value Range Comment   WBC 16.1 (*) 4.0 - 10.5 K/uL    RBC 4.22  3.87 - 5.11  MIL/uL    Hemoglobin 11.6 (*) 12.0 - 15.0 g/dL    HCT 96.0 (*) 45.4 - 46.0 %    MCV 82.2  78.0 - 100.0 fL    MCH 27.5  26.0 - 34.0 pg    MCHC 33.4  30.0 - 36.0 g/dL    RDW 09.8  11.9 - 14.7 %    Platelets 215  150 - 400 K/uL   COMPREHENSIVE METABOLIC PANEL     Status: Abnormal   Collection Time   05/27/12  9:05 AM      Component Value Range Comment   Sodium 136  135 - 145 mEq/L    Potassium 3.4 (*) 3.5 - 5.1 mEq/L    Chloride 101  96 - 112 mEq/L    CO2 19  19 - 32 mEq/L    Glucose, Bld 73  70  - 99 mg/dL    BUN 8  6 - 23 mg/dL    Creatinine, Ser 8.29  0.50 - 1.10 mg/dL    Calcium 8.5  8.4 - 56.2 mg/dL    Total Protein 5.4 (*) 6.0 - 8.3 g/dL    Albumin 1.7 (*) 3.5 - 5.2 g/dL    AST 21  0 - 37 U/L    ALT 18  0 - 35 U/L    Alkaline Phosphatase 134 (*) 39 - 117 U/L    Total Bilirubin 0.4  0.3 - 1.2 mg/dL    GFR calc non Af Amer >90  >90 mL/min    GFR calc Af Amer >90  >90 mL/min       Component Value Date/Time   SDES BLOOD RIGHT HAND 05/26/2012 1220   SPECREQUEST BOTTLES DRAWN AEROBIC ONLY 3CC 05/26/2012 1220   CULT        BLOOD CULTURE RECEIVED NO GROWTH TO DATE CULTURE WILL BE HELD FOR 5 DAYS BEFORE ISSUING A FINAL NEGATIVE REPORT 05/26/2012 1220   REPTSTATUS PENDING 05/26/2012 1220   Mr Lumbar Spine Wo Contrast  05/25/2012  *RADIOLOGY REPORT*  Clinical Data: 2 days post vaginal birth.  Extreme pain down right leg for past 24 hours.  MRI LUMBAR SPINE WITHOUT CONTRAST  Technique:  Multiplanar and multiecho pulse sequences of the lumbar spine were obtained without intravenous contrast.  Comparison: None.  Findings: Last fully open disc space is labeled L5-S1.  Present examination incorporates from T9-10 through the S3 level.  Conus L1-2 level.  Signal drop-off T9-L1 limits evaluation.  No gross abnormality of the distal cord or conus is noted.  Decreased signal intensity of bone marrow most likely related to the recent postpartum state.  Underlying anemia not excluded.  No evidence of right-sided disc herniation.  At the L5-S1 level, there is a mild bulge/shallow protrusion without mass effect.  Nonspecific edema/hemorrhage anterior to the right aspect of the sacrum involving the right iliacus and psoas muscle.  This may represent hemorrhage from recent delivery. Nerve root injury with leakage of cerebrospinal fluid or result of venous thrombosis not excluded  although would be unusual.  Infection not entirely excluded in the proper clinical setting.  MR pelvis can be obtained for further  delineation.  Enlarged uterus consistent with recent postpartum state.  IMPRESSION: Evaluation of the distal cord and conus is limited without gross abnormality detected.  No right-sided disc herniation causing nerve root compression identified.  Nonspecific edema/hemorrhage anterior to the right aspect of the sacrum involving the right iliacus and psoas muscle.  Please see above. M r pelvis to be obtained  for further delineation.  Critical Value/emergent results were called by telephone at the time of interpretation on 05/25/2012 at 6:15 p.m. to Dr. Langston Masker, who verbally acknowledged these results.   Original Report Authenticated By: Lacy Duverney, M.D.    Mr Pelvis Wo Contrast  05/27/2012  *RADIOLOGY REPORT*  Clinical Data: Right pelvic and leg pain for 2 days status post vaginal delivery.  MRI PELVIS WITHOUT CONTRAST  Technique:  Multi-planar multi-sequence MR imaging of the pelvis was performed following the standard protocol. No intravenous contrast was administered.  Comparison: Lumbar MRI 05/25/2012.  Findings: There is stable postpartum enlargement of the uterus which measures approximately 20 cm in length.  There is some fluid within the endocervical canal.  Urinary bladder is decompressed by a Foley catheter.  As demonstrated on the recent lumbar MRI, there is nonspecific presacral edema and ill-defined fluid which extends asymmetrically towards the right along the iliacus and piriformis muscles. There is some superior extension into the retroperitoneum as well as a small amount of free pelvic fluid.  No paraspinal component is evident.  There is no focal fluid collection.  There is also asymmetric edema and ill-defined fluid within the gluteus musculature, right greater than left.  The gluteus tendons appear intact.  The iliopsoas and hamstring tendons are intact.  There is mildly asymmetric hyperintensity within the right sacroiliac joint which does not appear significantly widened. There is no adjacent  erosive change or subchondral edema, although the soft tissue findings directly abut the right sacroiliac joint. The symphysis pubis is intact.  The remainder of the bony pelvis appears normal.  There is no hip joint effusion or femoral head abnormality.  Appropriate flow voids are present within the iliac and femoral vasculature bilaterally.  IMPRESSION:  1.  As demonstrated on recent lumbar MRI, there is persistent asymmetric but nonspecific edema involving the right iliacus, piriformis and gluteus musculature. As this process abuts the right sacroiliac joint which demonstrates mild asymmetric hyperintensity, early sacroiliitis is of concern (potentially infectious).  There is no evidence of osteomyelitis. 2.  No drainable fluid collection or paraspinal component identified. 3.  No evidence of pelvic DVT on routine imaging. 4.  Stable postpartum enlargement of the uterus.  These results will be called to the ordering clinician or representative by the Radiologist Assistant, and communication documented in the PACS Dashboard.   Original Report Authenticated By: Carey Bullocks, M.D.    Recent Results (from the past 240 hour(s))  CULTURE, BLOOD (ROUTINE X 2)     Status: Normal (Preliminary result)   Collection Time   05/26/12 12:10 PM      Component Value Range Status Comment   Specimen Description BLOOD RIGHT ARM   Final    Special Requests     Final    Value: BOTTLES DRAWN AEROBIC AND ANAEROBIC 6CC EACH BOTTLE   Culture  Setup Time 05/26/2012 17:04   Final    Culture     Final    Value:        BLOOD CULTURE RECEIVED NO GROWTH TO DATE CULTURE WILL BE HELD FOR 5 DAYS BEFORE ISSUING A FINAL NEGATIVE REPORT   Report Status PENDING   Incomplete   CULTURE, BLOOD (ROUTINE X 2)     Status: Normal (Preliminary result)   Collection Time   05/26/12 12:20 PM      Component Value Range Status Comment   Specimen Description BLOOD RIGHT HAND   Final    Special Requests BOTTLES DRAWN AEROBIC ONLY 3CC  Final     Culture  Setup Time 05/26/2012 17:09   Final    Culture     Final    Value:        BLOOD CULTURE RECEIVED NO GROWTH TO DATE CULTURE WILL BE HELD FOR 5 DAYS BEFORE ISSUING A FINAL NEGATIVE REPORT   Report Status PENDING   Incomplete       05/27/2012, 5:27 PM     LOS: 1 day

## 2012-05-27 NOTE — Progress Notes (Signed)
[facility] PROGRESS NOTE  Tammy Roman ZOX:[id number] DOB: Dec 16, 1980 DOA: 05/26/2012 PCP: Nani Gasser, MD  Brief narrative 32 year old African American female patient with no significant PMH, S/P vaginal delivery of healthy baby boy on 05/23/12, initially presented with right leg pain which spread to right hip, back and right foot-all started 24 hours post delivery. She was initially seen at [facility]. Neurology consulted. MRI L-spine without gross abnormalities. She was transferred to [facility] on 1/21 for further evaluation (MRI of pelvis) and management.  Assessment/Plan:  SIRS/? Sepsis Patient presented with fever, tachycardia and leukocytosis. She was started on broad spectrum IV antibiotics-vancomycin, Zosyn and clindamycin for possible intrapelvic infection. She was aggressively hydrated with IV fluids. MRI pelvis does not show intrapelvic collection. It does show persistent asymmetric but nonspecific edema involving the right iliacus, piriformis and gluteus musculature and right sacroiliac joint which demonstrates mild asymmetric hyperintensity, early sacroiliitis is of concern (potentially infectious) and no pelvic DVT (discussed findings with the reading radiologist). Patient continues to have 10/10 right gluteal/hip pain-we'll change Dilaudid PCA to full dose. Fever is better. Infectious disease consulted for further assistance. Blood cultures- NTD. UA- to be sent.   Hypokalemia Will replete and follow BMP.  Leukocytosis Secondary to SIRS. Management as above. Follow CBCs. Improving.  Anemia Stable.    Code Status: Full Family Communication: Discussed with spouse at bedside. Disposition Plan: Home when medically stable.   Consultants:  Infectious disease  Procedures:  None  Antibiotics:  IV Zosyn 1/21  IV vancomycin 1/21  IV clindamycin 1/21  HPI/Subjective: Constant right gluteal/hip pain-10/10 in severity, sharp, worse with  movements, better by lying still or with medications. Denies chest pain or dyspnea.  Objective: Filed Vitals:   05/27/12 0800 05/27/12 1016 05/27/12 1024 05/27/12 1402  BP:   126/57 123/54  Pulse:   123 122  Temp:   99.5 F (37.5 C) 99.9 F (37.7 C)  TempSrc:   Oral Oral  Resp: 34 33 32 28  Height:      Weight:      SpO2: 92% 92% 94% 93%    Intake/Output Summary (Last 24 hours) at 05/27/12 1403 Last data filed at 05/27/12 1402  Gross per 24 hour  Intake    240 ml  Output   2200 ml  Net  -1960 ml   Filed Weights   05/26/12 1824  Weight: 131.543 kg (290 lb)    Exam: Patient was examined with her female nurse in room   General exam: Morbidly obese female lying supine in bed, in mild painful distress but no respiratory distress.  Respiratory system: Clear to auscultation. No increased work of breathing.  Cardiovascular system: S1 and S2 heard, regular tachycardic. No JVD, murmurs or pedal edema.  Gastrointestinal system: Abdomen is nondistended, soft and nontender. Uterus palpable suprapubically. Normal bowel sounds heard.  Central nervous system: Alert and oriented. No focal neurological deficits.  Extremities: Patient unable to turn to examine right hip/gluteal region in detail. No obvious acute findings on limited exam  Data Reviewed: Basic Metabolic Panel:  Lab 05/27/12 8119  NA 136  K 3.4*  CL 101  CO2 19  GLUCOSE 73  BUN 8  CREATININE 0.72  CALCIUM 8.5  MG --  PHOS --   Liver Function Tests:  Lab 05/27/12 0905  AST 21  ALT 18  ALKPHOS 134*  BILITOT 0.4  PROT 5.4*  ALBUMIN 1.7*   No results found for this basename: LIPASE:5,AMYLASE:5 in the last 168 hours No results  found for this basename: AMMONIA:5 in the last 168 hours CBC:  Lab 05/27/12 0905 05/26/12 0110 05/24/12 0540 05/23/12 1319  WBC 16.1* 14.1* 19.7* 11.7*  NEUTROABS -- -- -- --  HGB 11.6* 11.0* 10.8* 11.7*  HCT 34.7* 34.0* 33.4* 36.2  MCV 82.2 84.0 84.6 84.4  PLT 215 170 163  178   Cardiac Enzymes: No results found for this basename: CKTOTAL:5,CKMB:5,CKMBINDEX:5,TROPONINI:5 in the last 168 hours BNP (last 3 results) No results found for this basename: PROBNP:3 in the last 8760 hours CBG: No results found for this basename: GLUCAP:5 in the last 168 hours  Recent Results (from the past 240 hour(s))  CULTURE, BLOOD (ROUTINE X 2)     Status: Normal (Preliminary result)   Collection Time   05/26/12 12:10 PM      Component Value Range Status Comment   Specimen Description BLOOD RIGHT ARM   Final    Special Requests     Final    Value: BOTTLES DRAWN AEROBIC AND ANAEROBIC 6CC EACH BOTTLE   Culture  Setup Time 05/26/2012 17:04   Final    Culture     Final    Value:        BLOOD CULTURE RECEIVED NO GROWTH TO DATE CULTURE WILL BE HELD FOR 5 DAYS BEFORE ISSUING A FINAL NEGATIVE REPORT   Report Status PENDING   Incomplete   CULTURE, BLOOD (ROUTINE X 2)     Status: Normal (Preliminary result)   Collection Time   05/26/12 12:20 PM      Component Value Range Status Comment   Specimen Description BLOOD RIGHT HAND   Final    Special Requests BOTTLES DRAWN AEROBIC ONLY 3CC   Final    Culture  Setup Time 05/26/2012 17:09   Final    Culture     Final    Value:        BLOOD CULTURE RECEIVED NO GROWTH TO DATE CULTURE WILL BE HELD FOR 5 DAYS BEFORE ISSUING A FINAL NEGATIVE REPORT   Report Status PENDING   Incomplete      Studies: Mr Lumbar Spine Wo Contrast  05/25/2012  *RADIOLOGY REPORT*  Clinical Data: 2 days post vaginal birth.  Extreme pain down right leg for past 24 hours.  MRI LUMBAR SPINE WITHOUT CONTRAST  Technique:  Multiplanar and multiecho pulse sequences of the lumbar spine were obtained without intravenous contrast.  Comparison: None.  Findings: Last fully open disc space is labeled L5-S1.  Present examination incorporates from T9-10 through the S3 level.  Conus L1-2 level.  Signal drop-off T9-L1 limits evaluation.  No gross abnormality of the distal cord or conus  is noted.  Decreased signal intensity of bone marrow most likely related to the recent postpartum state.  Underlying anemia not excluded.  No evidence of right-sided disc herniation.  At the L5-S1 level, there is a mild bulge/shallow protrusion without mass effect.  Nonspecific edema/hemorrhage anterior to the right aspect of the sacrum involving the right iliacus and psoas muscle.  This may represent hemorrhage from recent delivery. Nerve root injury with leakage of cerebrospinal fluid or result of venous thrombosis not excluded  although would be unusual.  Infection not entirely excluded in the proper clinical setting.  MR pelvis can be obtained for further delineation.  Enlarged uterus consistent with recent postpartum state.  IMPRESSION: Evaluation of the distal cord and conus is limited without gross abnormality detected.  No right-sided disc herniation causing nerve root compression identified.  Nonspecific edema/hemorrhage anterior to the  right aspect of the sacrum involving the right iliacus and psoas muscle.  Please see above. M r pelvis to be obtained for further delineation.  Critical Value/emergent results were called by telephone at the time of interpretation on 05/25/2012 at 6:15 p.m. to Dr. Langston Masker, who verbally acknowledged these results.   Original Report Authenticated By: Lacy Duverney, M.D.    Mr Pelvis Wo Contrast  05/27/2012  *RADIOLOGY REPORT*  Clinical Data: Right pelvic and leg pain for 2 days status post vaginal delivery.  MRI PELVIS WITHOUT CONTRAST  Technique:  Multi-planar multi-sequence MR imaging of the pelvis was performed following the standard protocol. No intravenous contrast was administered.  Comparison: Lumbar MRI 05/25/2012.  Findings: There is stable postpartum enlargement of the uterus which measures approximately 20 cm in length.  There is some fluid within the endocervical canal.  Urinary bladder is decompressed by a Foley catheter.  As demonstrated on the recent lumbar  MRI, there is nonspecific presacral edema and ill-defined fluid which extends asymmetrically towards the right along the iliacus and piriformis muscles. There is some superior extension into the retroperitoneum as well as a small amount of free pelvic fluid.  No paraspinal component is evident.  There is no focal fluid collection.  There is also asymmetric edema and ill-defined fluid within the gluteus musculature, right greater than left.  The gluteus tendons appear intact.  The iliopsoas and hamstring tendons are intact.  There is mildly asymmetric hyperintensity within the right sacroiliac joint which does not appear significantly widened. There is no adjacent erosive change or subchondral edema, although the soft tissue findings directly abut the right sacroiliac joint. The symphysis pubis is intact.  The remainder of the bony pelvis appears normal.  There is no hip joint effusion or femoral head abnormality.  Appropriate flow voids are present within the iliac and femoral vasculature bilaterally.  IMPRESSION:  1.  As demonstrated on recent lumbar MRI, there is persistent asymmetric but nonspecific edema involving the right iliacus, piriformis and gluteus musculature. As this process abuts the right sacroiliac joint which demonstrates mild asymmetric hyperintensity, early sacroiliitis is of concern (potentially infectious).  There is no evidence of osteomyelitis. 2.  No drainable fluid collection or paraspinal component identified. 3.  No evidence of pelvic DVT on routine imaging. 4.  Stable postpartum enlargement of the uterus.  These results will be called to the ordering clinician or representative by the Radiologist Assistant, and communication documented in the PACS Dashboard.   Original Report Authenticated By: Carey Bullocks, M.D.     Scheduled Meds:    . clindamycin (CLEOCIN) IV  600 mg Intravenous Q6H  . HYDROmorphone PCA 0.3 mg/mL   Intravenous Q4H  . piperacillin-tazobactam (ZOSYN)  IV  3.375  g Intravenous Q8H  . sodium chloride  3 mL Intravenous Q12H  . vancomycin  1,500 mg Intravenous Q12H   Continuous Infusions:    . sodium chloride      Active Problems:  Sepsis    Time spent: 45 minutes    [facility]  [facility] Pager [phone]. If 8PM-8AM, please contact night-coverage at [email or web address], password [facility] 05/27/2012, 2:03 PM  LOS: 1 day

## 2012-05-28 ENCOUNTER — Inpatient Hospital Stay (HOSPITAL_COMMUNITY): Payer: 59

## 2012-05-28 ENCOUNTER — Encounter (HOSPITAL_COMMUNITY): Payer: Self-pay | Admitting: Radiology

## 2012-05-28 LAB — CBC WITH DIFFERENTIAL/PLATELET
Basophils Absolute: 0 10*3/uL (ref 0.0–0.1)
Basophils Relative: 0 % (ref 0–1)
Eosinophils Relative: 2 % (ref 0–5)
HCT: 35.1 % — ABNORMAL LOW (ref 36.0–46.0)
Hemoglobin: 11.4 g/dL — ABNORMAL LOW (ref 12.0–15.0)
Lymphocytes Relative: 14 % (ref 12–46)
MCHC: 32.5 g/dL (ref 30.0–36.0)
MCV: 83.4 fL (ref 78.0–100.0)
Monocytes Absolute: 0.9 10*3/uL (ref 0.1–1.0)
Monocytes Relative: 8 % (ref 3–12)
RDW: 15 % (ref 11.5–15.5)

## 2012-05-28 LAB — BASIC METABOLIC PANEL
BUN: 7 mg/dL (ref 6–23)
CO2: 25 mEq/L (ref 19–32)
Calcium: 8.7 mg/dL (ref 8.4–10.5)
Creatinine, Ser: 0.79 mg/dL (ref 0.50–1.10)

## 2012-05-28 MED ORDER — FENTANYL CITRATE 0.05 MG/ML IJ SOLN
INTRAMUSCULAR | Status: AC | PRN
  Administered 2012-05-28: 25 ug via INTRAVENOUS
  Administered 2012-05-28: 50 ug via INTRAVENOUS
  Administered 2012-05-28: 25 ug via INTRAVENOUS

## 2012-05-28 MED ORDER — KETOROLAC TROMETHAMINE 30 MG/ML IJ SOLN
INTRAMUSCULAR | Status: AC
  Administered 2012-05-28: 30 mg via INTRAVENOUS
  Filled 2012-05-28: qty 1

## 2012-05-28 MED ORDER — FENTANYL CITRATE 0.05 MG/ML IJ SOLN
INTRAMUSCULAR | Status: AC
  Filled 2012-05-28: qty 4

## 2012-05-28 MED ORDER — MIDAZOLAM HCL 2 MG/2ML IJ SOLN
INTRAMUSCULAR | Status: AC | PRN
  Administered 2012-05-28: 2 mg via INTRAVENOUS
  Administered 2012-05-28 (×2): 1 mg via INTRAVENOUS

## 2012-05-28 MED ORDER — MIDAZOLAM HCL 2 MG/2ML IJ SOLN
INTRAMUSCULAR | Status: AC
  Filled 2012-05-28: qty 4

## 2012-05-28 NOTE — H&P (Signed)
Tammy Roman is an 32 y.o. female.   Chief Complaint: post partum 05/23/12 Rt hip/leg pain since delivery MRI shows abn Rt SI joint Scheduled for aspiration now HPI: obese; Rt hip pain  Past Medical History  Diagnosis Date  . Obesity   . HSV-2 (herpes simplex virus 2) infection   . No pertinent past medical history     History reviewed. No pertinent past surgical history.  Family History  Problem Relation Age of Onset  . Hyperlipidemia Mother   . Hypertension Mother   . Hyperlipidemia Father   . Hypertension Father   . Prostate cancer Father   . Cancer Father     prostate  . Breast cancer Paternal Aunt     metastasized    Social History:  reports that she has never smoked. She has never used smokeless tobacco. She reports that she does not drink alcohol or use illicit drugs.  Allergies: No Known Allergies  Medications Prior to Admission  Medication Sig Dispense Refill  . cyclobenzaprine (FLEXERIL) 10 MG tablet Take 10 mg by mouth every 8 (eight) hours as needed. For muscle spasms.      Marland Kitchen oxyCODONE-acetaminophen (PERCOCET/ROXICET) 5-325 MG per tablet Take 1-2 tablets by mouth every 6 (six) hours as needed. For pain.      . Prenatal Vit-Fe Fumarate-FA (PRENATAL MULTIVITAMIN) TABS Take 1 tablet by mouth every morning.      . [DISCONTINUED] cyclobenzaprine (FLEXERIL) 10 MG tablet Take 1 tablet (10 mg total) by mouth every 8 (eight) hours as needed for muscle spasms.  30 tablet  0  . [DISCONTINUED] oxyCODONE-acetaminophen (PERCOCET/ROXICET) 5-325 MG per tablet Take 1-2 tablets by mouth every 6 (six) hours as needed.  30 tablet  0  . ibuprofen (ADVIL,MOTRIN) 800 MG tablet Take 800 mg by mouth every 8 (eight) hours as needed. For pain.      . valACYclovir (VALTREX) 500 MG tablet Take 1 tablet (500 mg total) by mouth daily.  30 tablet  3  . [DISCONTINUED] ibuprofen (ADVIL,MOTRIN) 800 MG tablet Take 1 tablet (800 mg total) by mouth every 8 (eight) hours as needed.  30 tablet  0     Results for orders placed during the hospital encounter of 05/26/12 (from the past 48 hour(s))  CBC     Status: Abnormal   Collection Time   05/27/12  9:05 AM      Component Value Range Comment   WBC 16.1 (*) 4.0 - 10.5 K/uL    RBC 4.22  3.87 - 5.11 MIL/uL    Hemoglobin 11.6 (*) 12.0 - 15.0 g/dL    HCT 16.1 (*) 09.6 - 46.0 %    MCV 82.2  78.0 - 100.0 fL    MCH 27.5  26.0 - 34.0 pg    MCHC 33.4  30.0 - 36.0 g/dL    RDW 04.5  40.9 - 81.1 %    Platelets 215  150 - 400 K/uL   COMPREHENSIVE METABOLIC PANEL     Status: Abnormal   Collection Time   05/27/12  9:05 AM      Component Value Range Comment   Sodium 136  135 - 145 mEq/L    Potassium 3.4 (*) 3.5 - 5.1 mEq/L    Chloride 101  96 - 112 mEq/L    CO2 19  19 - 32 mEq/L    Glucose, Bld 73  70 - 99 mg/dL    BUN 8  6 - 23 mg/dL    Creatinine, Ser 9.14  0.50 - 1.10 mg/dL    Calcium 8.5  8.4 - 40.9 mg/dL    Total Protein 5.4 (*) 6.0 - 8.3 g/dL    Albumin 1.7 (*) 3.5 - 5.2 g/dL    AST 21  0 - 37 U/L    ALT 18  0 - 35 U/L    Alkaline Phosphatase 134 (*) 39 - 117 U/L    Total Bilirubin 0.4  0.3 - 1.2 mg/dL    GFR calc non Af Amer >90  >90 mL/min    GFR calc Af Amer >90  >90 mL/min   CBC WITH DIFFERENTIAL     Status: Abnormal   Collection Time   05/28/12  6:00 AM      Component Value Range Comment   WBC 10.5  4.0 - 10.5 K/uL    RBC 4.21  3.87 - 5.11 MIL/uL    Hemoglobin 11.4 (*) 12.0 - 15.0 g/dL    HCT 81.1 (*) 91.4 - 46.0 %    MCV 83.4  78.0 - 100.0 fL    MCH 27.1  26.0 - 34.0 pg    MCHC 32.5  30.0 - 36.0 g/dL    RDW 78.2  95.6 - 21.3 %    Platelets 218  150 - 400 K/uL    Neutrophils Relative 77  43 - 77 %    Neutro Abs 8.1 (*) 1.7 - 7.7 K/uL    Lymphocytes Relative 14  12 - 46 %    Lymphs Abs 1.4  0.7 - 4.0 K/uL    Monocytes Relative 8  3 - 12 %    Monocytes Absolute 0.9  0.1 - 1.0 K/uL    Eosinophils Relative 2  0 - 5 %    Eosinophils Absolute 0.2  0.0 - 0.7 K/uL    Basophils Relative 0  0 - 1 %    Basophils Absolute  0.0  0.0 - 0.1 K/uL   BASIC METABOLIC PANEL     Status: Abnormal   Collection Time   05/28/12  6:00 AM      Component Value Range Comment   Sodium 140  135 - 145 mEq/L    Potassium 3.8  3.5 - 5.1 mEq/L    Chloride 105  96 - 112 mEq/L    CO2 25  19 - 32 mEq/L    Glucose, Bld 129 (*) 70 - 99 mg/dL    BUN 7  6 - 23 mg/dL    Creatinine, Ser 0.86  0.50 - 1.10 mg/dL    Calcium 8.7  8.4 - 57.8 mg/dL    GFR calc non Af Amer >90  >90 mL/min    GFR calc Af Amer >90  >90 mL/min    Mr Pelvis Wo Contrast  05/27/2012  *RADIOLOGY REPORT*  Clinical Data: Right pelvic and leg pain for 2 days status post vaginal delivery.  MRI PELVIS WITHOUT CONTRAST  Technique:  Multi-planar multi-sequence MR imaging of the pelvis was performed following the standard protocol. No intravenous contrast was administered.  Comparison: Lumbar MRI 05/25/2012.  Findings: There is stable postpartum enlargement of the uterus which measures approximately 20 cm in length.  There is some fluid within the endocervical canal.  Urinary bladder is decompressed by a Foley catheter.  As demonstrated on the recent lumbar MRI, there is nonspecific presacral edema and ill-defined fluid which extends asymmetrically towards the right along the iliacus and piriformis muscles. There is some superior extension into the retroperitoneum as well as a small amount of  free pelvic fluid.  No paraspinal component is evident.  There is no focal fluid collection.  There is also asymmetric edema and ill-defined fluid within the gluteus musculature, right greater than left.  The gluteus tendons appear intact.  The iliopsoas and hamstring tendons are intact.  There is mildly asymmetric hyperintensity within the right sacroiliac joint which does not appear significantly widened. There is no adjacent erosive change or subchondral edema, although the soft tissue findings directly abut the right sacroiliac joint. The symphysis pubis is intact.  The remainder of the bony  pelvis appears normal.  There is no hip joint effusion or femoral head abnormality.  Appropriate flow voids are present within the iliac and femoral vasculature bilaterally.  IMPRESSION:  1.  As demonstrated on recent lumbar MRI, there is persistent asymmetric but nonspecific edema involving the right iliacus, piriformis and gluteus musculature. As this process abuts the right sacroiliac joint which demonstrates mild asymmetric hyperintensity, early sacroiliitis is of concern (potentially infectious).  There is no evidence of osteomyelitis. 2.  No drainable fluid collection or paraspinal component identified. 3.  No evidence of pelvic DVT on routine imaging. 4.  Stable postpartum enlargement of the uterus.  These results will be called to the ordering clinician or representative by the Radiologist Assistant, and communication documented in the PACS Dashboard.   Original Report Authenticated By: Carey Bullocks, M.D.     Review of Systems  Constitutional:       Low grade temp  Cardiovascular: Negative for chest pain.  Gastrointestinal: Negative for nausea, vomiting and abdominal pain.  Musculoskeletal: Positive for back pain and joint pain.       Rt SI pain; hip pain  Neurological: Positive for weakness. Negative for headaches.    Blood pressure 133/80, pulse 111, temperature 98.2 F (36.8 C), temperature source Oral, resp. rate 22, height 5\' 7"  (1.702 m), weight 290 lb (131.543 kg), last menstrual period 03/06/2011, SpO2 94.00%, not currently breastfeeding. Physical Exam  Constitutional: She is oriented to person, place, and time.  Cardiovascular: Normal rate, regular rhythm and normal heart sounds.   No murmur heard. Respiratory: Effort normal and breath sounds normal. She has no wheezes.  GI: Soft. Bowel sounds are normal. There is no tenderness.       obese  Musculoskeletal: Normal range of motion.       Rt hip pain  Neurological: She is alert and oriented to person, place, and time.    Skin: Skin is warm and dry.  Psychiatric: She has a normal mood and affect. Her behavior is normal. Judgment and thought content normal.     Assessment/Plan Rt hip/leg pain x 2 weeks MRI abn: Rt SI jt area of concern Scheduled now for SI jy aspiration Pt aware of procedure benefits and risks and agreeable to proceed Consent signed and in chart   Heidee Audi A 05/28/2012, 12:48 PM

## 2012-05-28 NOTE — H&P (Signed)
Agree with PA note.  Given no fluid on MRI, the yield is likely very low, but we will try.  Signed,  Sterling Big, MD Vascular & Interventional Radiologist [facility]

## 2012-05-28 NOTE — Progress Notes (Signed)
INFECTIOUS DISEASE PROGRESS NOTE  ID: Tammy Roman is a 32 y.o. female with   Active Problems:  Sepsis  Hypokalemia  Leukocytosis  Anemia  Subjective: Pain improved.   Abtx:  Anti-infectives     Start     Dose/Rate Route Frequency Ordered Stop   05/27/12 1000   vancomycin (VANCOCIN) 1,500 mg in sodium chloride 0.9 % 500 mL IVPB        1,500 mg 250 mL/hr over 120 Minutes Intravenous Every 12 hours 05/26/12 1929     05/27/12 0330  piperacillin-tazobactam (ZOSYN) IVPB 3.375 g       3.375 g 12.5 mL/hr over 240 Minutes Intravenous 3 times per day 05/26/12 1926     05/27/12 0000   clindamycin (CLEOCIN) IVPB 600 mg  Status:  Discontinued        600 mg 100 mL/hr over 30 Minutes Intravenous 4 times per day 05/26/12 2045 05/27/12 1752   05/26/12 2045   clindamycin (CLEOCIN) IVPB 300 mg  Status:  Discontinued        300 mg 100 mL/hr over 30 Minutes Intravenous 4 times per day 05/26/12 2041 05/26/12 2045   05/26/12 1930  piperacillin-tazobactam (ZOSYN) IVPB 4.5 g       4.5 g 200 mL/hr over 30 Minutes Intravenous  Once 05/26/12 1904 05/26/12 2256   05/26/12 1915   vancomycin (VANCOCIN) 2,000 mg in sodium chloride 0.9 % 500 mL IVPB        2,000 mg 250 mL/hr over 120 Minutes Intravenous  Once 05/26/12 1904 05/27/12 0059          Medications:  Scheduled:   . HYDROmorphone PCA 0.3 mg/mL   Intravenous Q4H  . piperacillin-tazobactam (ZOSYN)  IV  3.375 g Intravenous Q8H  . sodium chloride  3 mL Intravenous Q12H  . vancomycin  1,500 mg Intravenous Q12H    Objective: Vital signs in last 24 hours: Temp:  [98.2 F (36.8 C)-100.5 F (38.1 C)] 98.3 F (36.8 C) (01/23 1343) Pulse Rate:  [87-121] 102  (01/23 1343) Resp:  [20-37] 25  (01/23 1343) BP: (121-135)/(58-82) 124/58 mmHg (01/23 1343) SpO2:  [90 %-98 %] 96 % (01/23 1343)   General appearance: alert, cooperative and no distress GI: normal findings: bowel sounds normal and soft, non-tender Extremities: can move  her RLE today, much improved vs yesterday.  Lab Results  Basename 05/28/12 0600 05/27/12 0905  WBC 10.5 16.1*  HGB 11.4* 11.6*  HCT 35.1* 34.7*  NA 140 136  K 3.8 3.4*  CL 105 101  CO2 25 19  BUN 7 8  CREATININE 0.79 0.72  GLU -- --   Liver Panel  Basename 05/27/12 0905  PROT 5.4*  ALBUMIN 1.7*  AST 21  ALT 18  ALKPHOS 134*  BILITOT 0.4  BILIDIR --  IBILI --   Sedimentation Rate No results found for this basename: ESRSEDRATE in the last 72 hours C-Reactive Protein No results found for this basename: CRP:2 in the last 72 hours  Microbiology: Recent Results (from the past 240 hour(s))  URINE CULTURE     Status: Normal   Collection Time   05/26/12 10:20 AM      Component Value Range Status Comment   Specimen Description OB CATHETERIZED   Final    Special Requests NONE   Final    Culture  Setup Time 05/27/2012 01:27   Final    Colony Count NO GROWTH   Final    Culture NO GROWTH   Final  Report Status 05/27/2012 FINAL   Final   CULTURE, BLOOD (ROUTINE X 2)     Status: Normal (Preliminary result)   Collection Time   05/26/12 12:10 PM      Component Value Range Status Comment   Specimen Description BLOOD RIGHT ARM   Final    Special Requests     Final    Value: BOTTLES DRAWN AEROBIC AND ANAEROBIC 6CC EACH BOTTLE   Culture  Setup Time 05/26/2012 17:04   Final    Culture     Final    Value:        BLOOD CULTURE RECEIVED NO GROWTH TO DATE CULTURE WILL BE HELD FOR 5 DAYS BEFORE ISSUING A FINAL NEGATIVE REPORT   Report Status PENDING   Incomplete   CULTURE, BLOOD (ROUTINE X 2)     Status: Normal (Preliminary result)   Collection Time   05/26/12 12:20 PM      Component Value Range Status Comment   Specimen Description BLOOD RIGHT HAND   Final    Special Requests BOTTLES DRAWN AEROBIC ONLY 3CC   Final    Culture  Setup Time 05/26/2012 17:09   Final    Culture     Final    Value:        BLOOD CULTURE RECEIVED NO GROWTH TO DATE CULTURE WILL BE HELD FOR 5 DAYS BEFORE  ISSUING A FINAL NEGATIVE REPORT   Report Status PENDING   Incomplete     Studies/Results: Mr Pelvis Wo Contrast  05/27/2012  *RADIOLOGY REPORT*  Clinical Data: Right pelvic and leg pain for 2 days status post vaginal delivery.  MRI PELVIS WITHOUT CONTRAST  Technique:  Multi-planar multi-sequence MR imaging of the pelvis was performed following the standard protocol. No intravenous contrast was administered.  Comparison: Lumbar MRI 05/25/2012.  Findings: There is stable postpartum enlargement of the uterus which measures approximately 20 cm in length.  There is some fluid within the endocervical canal.  Urinary bladder is decompressed by a Foley catheter.  As demonstrated on the recent lumbar MRI, there is nonspecific presacral edema and ill-defined fluid which extends asymmetrically towards the right along the iliacus and piriformis muscles. There is some superior extension into the retroperitoneum as well as a small amount of free pelvic fluid.  No paraspinal component is evident.  There is no focal fluid collection.  There is also asymmetric edema and ill-defined fluid within the gluteus musculature, right greater than left.  The gluteus tendons appear intact.  The iliopsoas and hamstring tendons are intact.  There is mildly asymmetric hyperintensity within the right sacroiliac joint which does not appear significantly widened. There is no adjacent erosive change or subchondral edema, although the soft tissue findings directly abut the right sacroiliac joint. The symphysis pubis is intact.  The remainder of the bony pelvis appears normal.  There is no hip joint effusion or femoral head abnormality.  Appropriate flow voids are present within the iliac and femoral vasculature bilaterally.  IMPRESSION:  1.  As demonstrated on recent lumbar MRI, there is persistent asymmetric but nonspecific edema involving the right iliacus, piriformis and gluteus musculature. As this process abuts the right sacroiliac joint  which demonstrates mild asymmetric hyperintensity, early sacroiliitis is of concern (potentially infectious).  There is no evidence of osteomyelitis. 2.  No drainable fluid collection or paraspinal component identified. 3.  No evidence of pelvic DVT on routine imaging. 4.  Stable postpartum enlargement of the uterus.  These results will be called to the  ordering clinician or representative by the Radiologist Assistant, and communication documented in the PACS Dashboard.   Original Report Authenticated By: Carey Bullocks, M.D.      Assessment/Plan: Scaroiliitis  Total days of antibiotics: 3 (vanco/zosyn) Will have IR aspirate of SI.  Looking for evidence of infection (cx may be - since she is on anbx)  Johny Sax Infectious Diseases [phone] 05/28/2012, 3:08 PM   LOS: 2 days

## 2012-05-28 NOTE — Procedures (Signed)
Interventional Radiology Procedure Note  Procedure: CT aspiration of right SI joint.  No fluid, so lavaged with 8 mL fluid and aspirated with return of cloudy fluid and small particulate debris.  Lidocaine then injected into joint for analgesia. Complications: None Recommendations: - Follow cx - Consider toradol IV scheduled Q 6 hrs rather than ibuprofen for pain control  Signed,  Sterling Big, MD Vascular & Interventional Radiologist [facility] Radiology

## 2012-05-28 NOTE — Progress Notes (Signed)
[facility] PROGRESS NOTE  Tammy Roman ZOX:[id number] DOB: [birth year] DOA: 05/26/2012 PCP: Nani Gasser, MD  Brief narrative 32 year old African American female patient with no significant PMH, S/P vaginal delivery of healthy baby boy on 05/23/12, initially presented with right leg pain which spread to right hip, back and right foot-all started 24 hours post delivery. She was initially seen at [facility]. Neurology consulted. MRI L-spine without gross abnormalities. She was transferred to [facility] on 1/21 for further evaluation (MRI of pelvis) and management.  Assessment/Plan:  SIRS/? Sepsis She was started on broad spectrum IV antibiotics-vancomycin, Zosyn and clindamycin for possible intrapelvic infection. She was aggressively hydrated with IV fluids. MRI pelvis does not show intrapelvic collection. It does show persistent asymmetric but nonspecific edema involving the right iliacus, piriformis and gluteus musculature and right sacroiliac joint which demonstrates mild asymmetric hyperintensity, early sacroiliitis is of concern (potentially infectious) and no pelvic DVT (discussed findings with the reading radiologist). Blood cultures- NTD. Urine culture negative. Patient feels significantly better-decreased pain, fever and leukocytosis. ID and OB/GYN input appreciated. D/W Dr. Clayborn Bigness IR for possible diagnostic aspiration of right sacroiliac joint. D/W IR PA. Clindamycin discontinued. Currently on Dilaudid high-dose PCA and ibuprofen-may consider tapering Dilaudid  Hypokalemia Repleted  Leukocytosis Secondary to SIRS. Normalized  Anemia Stable.    Code Status: Full Family Communication: Discussed with spouse at bedside. Disposition Plan: Home when medically stable.   Consultants:  Infectious disease  Interventional radiology  Procedures:  None  Antibiotics:  IV Zosyn 1/21  IV vancomycin 1/21  IV clindamycin 1/21  >1/22  HPI/Subjective:  Complaints Right sacral pain has significantly improved-2/10 while on pain medications. Tolerating by mouth. Denies any other complaints  Objective: Filed Vitals:   05/28/12 0633 05/28/12 0800 05/28/12 1021 05/28/12 1220  BP:   133/80   Pulse:   111   Temp:   98.2 F (36.8 C)   TempSrc:   Oral   Resp: 24 28 34 22  Height:      Weight:      SpO2: 94% 95% 95% 94%    Intake/Output Summary (Last 24 hours) at 05/28/12 1226 Last data filed at 05/28/12 1022  Gross per 24 hour  Intake   1695 ml  Output   5000 ml  Net  -3305 ml   Filed Weights   05/26/12 1824  Weight: 131.543 kg (290 lb)    Exam: Patient was examined with her female nurse in room   General exam: Morbidly obese female lying supine in bed, looks much better than she did yesterday. Pleasant and comfortable.  Respiratory system: Clear to auscultation. No increased work of breathing.  Cardiovascular system: S1 and S2 heard, regular tachycardic. No JVD, murmurs or pedal edema.  Gastrointestinal system: Abdomen is nondistended, soft and nontender. Uterus palpable suprapubically. Normal bowel sounds heard.  Central nervous system: Alert and oriented. No focal neurological deficits.  Extremities: Patient unable to turn to examine right hip/gluteal region in detail. No obvious acute findings on limited exam  Data Reviewed: Basic Metabolic Panel:  Lab 05/28/12 8119 05/27/12 0905  NA 140 136  K 3.8 3.4*  CL 105 101  CO2 25 19  GLUCOSE 129* 73  BUN 7 8  CREATININE 0.79 0.72  CALCIUM 8.7 8.5  MG -- --  PHOS -- --   Liver Function Tests:  Lab 05/27/12 0905  AST 21  ALT 18  ALKPHOS 134*  BILITOT 0.4  PROT 5.4*  ALBUMIN 1.7*   No results found for  this basename: LIPASE:5,AMYLASE:5 in the last 168 hours No results found for this basename: AMMONIA:5 in the last 168 hours CBC:  Lab 05/28/12 0600 05/27/12 0905 05/26/12 0110 05/24/12 0540 05/23/12 1319  WBC 10.5 16.1* 14.1*  19.7* 11.7*  NEUTROABS 8.1* -- -- -- --  HGB 11.4* 11.6* 11.0* 10.8* 11.7*  HCT 35.1* 34.7* 34.0* 33.4* 36.2  MCV 83.4 82.2 84.0 84.6 84.4  PLT 218 215 170 163 178   Cardiac Enzymes: No results found for this basename: CKTOTAL:5,CKMB:5,CKMBINDEX:5,TROPONINI:5 in the last 168 hours BNP (last 3 results) No results found for this basename: PROBNP:3 in the last 8760 hours CBG: No results found for this basename: GLUCAP:5 in the last 168 hours  Recent Results (from the past 240 hour(s))  URINE CULTURE     Status: Normal   Collection Time   05/26/12 10:20 AM      Component Value Range Status Comment   Specimen Description OB CATHETERIZED   Final    Special Requests NONE   Final    Culture  Setup Time 05/27/2012 01:27   Final    Colony Count NO GROWTH   Final    Culture NO GROWTH   Final    Report Status 05/27/2012 FINAL   Final   CULTURE, BLOOD (ROUTINE X 2)     Status: Normal (Preliminary result)   Collection Time   05/26/12 12:10 PM      Component Value Range Status Comment   Specimen Description BLOOD RIGHT ARM   Final    Special Requests     Final    Value: BOTTLES DRAWN AEROBIC AND ANAEROBIC 6CC EACH BOTTLE   Culture  Setup Time 05/26/2012 17:04   Final    Culture     Final    Value:        BLOOD CULTURE RECEIVED NO GROWTH TO DATE CULTURE WILL BE HELD FOR 5 DAYS BEFORE ISSUING A FINAL NEGATIVE REPORT   Report Status PENDING   Incomplete   CULTURE, BLOOD (ROUTINE X 2)     Status: Normal (Preliminary result)   Collection Time   05/26/12 12:20 PM      Component Value Range Status Comment   Specimen Description BLOOD RIGHT HAND   Final    Special Requests BOTTLES DRAWN AEROBIC ONLY 3CC   Final    Culture  Setup Time 05/26/2012 17:09   Final    Culture     Final    Value:        BLOOD CULTURE RECEIVED NO GROWTH TO DATE CULTURE WILL BE HELD FOR 5 DAYS BEFORE ISSUING A FINAL NEGATIVE REPORT   Report Status PENDING   Incomplete      Studies: Mr Pelvis Wo Contrast  05/27/2012   *RADIOLOGY REPORT*  Clinical Data: Right pelvic and leg pain for 2 days status post vaginal delivery.  MRI PELVIS WITHOUT CONTRAST  Technique:  Multi-planar multi-sequence MR imaging of the pelvis was performed following the standard protocol. No intravenous contrast was administered.  Comparison: Lumbar MRI 05/25/2012.  Findings: There is stable postpartum enlargement of the uterus which measures approximately 20 cm in length.  There is some fluid within the endocervical canal.  Urinary bladder is decompressed by a Foley catheter.  As demonstrated on the recent lumbar MRI, there is nonspecific presacral edema and ill-defined fluid which extends asymmetrically towards the right along the iliacus and piriformis muscles. There is some superior extension into the retroperitoneum as well as a small amount of free pelvic  fluid.  No paraspinal component is evident.  There is no focal fluid collection.  There is also asymmetric edema and ill-defined fluid within the gluteus musculature, right greater than left.  The gluteus tendons appear intact.  The iliopsoas and hamstring tendons are intact.  There is mildly asymmetric hyperintensity within the right sacroiliac joint which does not appear significantly widened. There is no adjacent erosive change or subchondral edema, although the soft tissue findings directly abut the right sacroiliac joint. The symphysis pubis is intact.  The remainder of the bony pelvis appears normal.  There is no hip joint effusion or femoral head abnormality.  Appropriate flow voids are present within the iliac and femoral vasculature bilaterally.  IMPRESSION:  1.  As demonstrated on recent lumbar MRI, there is persistent asymmetric but nonspecific edema involving the right iliacus, piriformis and gluteus musculature. As this process abuts the right sacroiliac joint which demonstrates mild asymmetric hyperintensity, early sacroiliitis is of concern (potentially infectious).  There is no evidence  of osteomyelitis. 2.  No drainable fluid collection or paraspinal component identified. 3.  No evidence of pelvic DVT on routine imaging. 4.  Stable postpartum enlargement of the uterus.  These results will be called to the ordering clinician or representative by the Radiologist Assistant, and communication documented in the PACS Dashboard.   Original Report Authenticated By: Carey Bullocks, M.D.     Scheduled Meds:    . HYDROmorphone PCA 0.3 mg/mL   Intravenous Q4H  . piperacillin-tazobactam (ZOSYN)  IV  3.375 g Intravenous Q8H  . sodium chloride  3 mL Intravenous Q12H  . vancomycin  1,500 mg Intravenous Q12H   Continuous Infusions:    . sodium chloride 20 mL/hr (05/28/12 1035)    Active Problems:  Sepsis  Hypokalemia  Leukocytosis  Anemia    Time spent: 35 minutes    [facility]  [facility] Pager [phone]. If 8PM-8AM, please contact night-coverage at [email or web address], password [facility] 05/28/2012, 12:26 PM  LOS: 2 days

## 2012-05-29 DIAGNOSIS — R6510 Systemic inflammatory response syndrome (SIRS) of non-infectious origin without acute organ dysfunction: Secondary | ICD-10-CM

## 2012-05-29 MED ORDER — TRAMADOL HCL 50 MG PO TABS
50.0000 mg | ORAL_TABLET | Freq: Four times a day (QID) | ORAL | Status: DC | PRN
  Administered 2012-05-29 – 2012-05-30 (×5): 50 mg via ORAL
  Filled 2012-05-29 (×5): qty 1

## 2012-05-29 MED ORDER — VANCOMYCIN HCL 10 G IV SOLR
1250.0000 mg | Freq: Three times a day (TID) | INTRAVENOUS | Status: DC
  Filled 2012-05-29: qty 1250

## 2012-05-29 MED ORDER — HYDROMORPHONE HCL PF 1 MG/ML IJ SOLN: 1.0000 mg | INTRAMUSCULAR | Status: DC | PRN

## 2012-05-29 MED ORDER — IBUPROFEN 600 MG PO TABS
600.0000 mg | ORAL_TABLET | Freq: Four times a day (QID) | ORAL | Status: DC | PRN
  Filled 2012-05-29 (×2): qty 1

## 2012-05-29 NOTE — Progress Notes (Signed)
ANTIBIOTIC CONSULT NOTE - FOLLOW UP  Pharmacy Consult for Vancomycin Indication: Sacroiliitis s/p birth and rule-out sepsis  No Known Allergies  Patient Measurements: Height: 5\' 7"  (170.2 cm) Weight: 290 lb (131.543 kg) IBW/kg (Calculated) : 61.6  Adjusted Body Weight:   Vital Signs: Temp: 98.2 F (36.8 C) (01/24 1022) Temp src: Oral (01/24 1022) BP: 139/88 mmHg (01/24 1022) Pulse Rate: 108  (01/24 1022) Intake/Output from previous day: 01/23 0701 - 01/24 0700 In: 1789 [P.O.:340; I.V.:299; IV Piggyback:1150] Out: 1350 [Urine:1350] Intake/Output from this shift: Total I/O In: 240 [P.O.:240] Out: -   Labs:  Basename 05/28/12 0600 05/27/12 0905  WBC 10.5 16.1*  HGB 11.4* 11.6*  PLT 218 215  LABCREA -- --  CREATININE 0.79 0.72   Estimated Creatinine Clearance: 145.4 ml/min (by C-G formula based on Cr of 0.79).  Basename 05/29/12 0935  VANCOTROUGH 11.8  VANCOPEAK --  VANCORANDOM --  GENTTROUGH --  GENTPEAK --  GENTRANDOM --  TOBRATROUGH --  TOBRAPEAK --  TOBRARND --  AMIKACINPEAK --  AMIKACINTROU --  AMIKACIN --     Microbiology: Recent Results (from the past 720 hour(s))  URINE CULTURE     Status: Normal   Collection Time   05/26/12 10:20 AM      Component Value Range Status Comment   Specimen Description OB CATHETERIZED   Final    Special Requests NONE   Final    Culture  Setup Time 05/27/2012 01:27   Final    Colony Count NO GROWTH   Final    Culture NO GROWTH   Final    Report Status 05/27/2012 FINAL   Final   CULTURE, BLOOD (ROUTINE X 2)     Status: Normal (Preliminary result)   Collection Time   05/26/12 12:10 PM      Component Value Range Status Comment   Specimen Description BLOOD RIGHT ARM   Final    Special Requests     Final    Value: BOTTLES DRAWN AEROBIC AND ANAEROBIC 6CC EACH BOTTLE   Culture  Setup Time 05/26/2012 17:04   Final    Culture     Final    Value:        BLOOD CULTURE RECEIVED NO GROWTH TO DATE CULTURE WILL BE HELD FOR 5  DAYS BEFORE ISSUING A FINAL NEGATIVE REPORT   Report Status PENDING   Incomplete   CULTURE, BLOOD (ROUTINE X 2)     Status: Normal (Preliminary result)   Collection Time   05/26/12 12:20 PM      Component Value Range Status Comment   Specimen Description BLOOD RIGHT HAND   Final    Special Requests BOTTLES DRAWN AEROBIC ONLY 3CC   Final    Culture  Setup Time 05/26/2012 17:09   Final    Culture     Final    Value:        BLOOD CULTURE RECEIVED NO GROWTH TO DATE CULTURE WILL BE HELD FOR 5 DAYS BEFORE ISSUING A FINAL NEGATIVE REPORT   Report Status PENDING   Incomplete   BODY FLUID CULTURE     Status: Normal (Preliminary result)   Collection Time   05/28/12  5:38 PM      Component Value Range Status Comment   Specimen Description SYNOVIAL FLUID   Final    Special Requests RIGHT SACROILIAC JOINT ASPIRATION   Final    Gram Stain     Final    Value: NO WBC SEEN  NO ORGANISMS SEEN   Culture PENDING   Incomplete    Report Status PENDING   Incomplete     Anti-infectives     Start     Dose/Rate Route Frequency Ordered Stop   05/27/12 1000   vancomycin (VANCOCIN) 1,500 mg in sodium chloride 0.9 % 500 mL IVPB        1,500 mg 250 mL/hr over 120 Minutes Intravenous Every 12 hours 05/26/12 1929     05/27/12 0330  piperacillin-tazobactam (ZOSYN) IVPB 3.375 g       3.375 g 12.5 mL/hr over 240 Minutes Intravenous 3 times per day 05/26/12 1926     05/27/12 0000   clindamycin (CLEOCIN) IVPB 600 mg  Status:  Discontinued        600 mg 100 mL/hr over 30 Minutes Intravenous 4 times per day 05/26/12 2045 05/27/12 1752   05/26/12 2045   clindamycin (CLEOCIN) IVPB 300 mg  Status:  Discontinued        300 mg 100 mL/hr over 30 Minutes Intravenous 4 times per day 05/26/12 2041 05/26/12 2045   05/26/12 1930  piperacillin-tazobactam (ZOSYN) IVPB 4.5 g       4.5 g 200 mL/hr over 30 Minutes Intravenous  Once 05/26/12 1904 05/26/12 2256   05/26/12 1915   vancomycin (VANCOCIN) 2,000 mg in sodium  chloride 0.9 % 500 mL IVPB        2,000 mg 250 mL/hr over 120 Minutes Intravenous  Once 05/26/12 1904 05/27/12 0059          Assessment: 32yo female vaginal delivery 1/18, on day #4 of Vancomycin and Zosyn for sacroiliitis and r/o sepsis.  Synovial fluid culture is NTD, blood cultures were cancelled.  Vancomycin trough this AM is 11.8, on 1500mg  IV q24.    Goal of Therapy:  Vancomycin trough level 15-20 mcg/ml  Plan:  1.  Change Vancomycin to 1250mg  IV q8, next dose this evening. 2.  Continue Zosyn 3.375gm IV q8, 4hr infusion 3.  F/U cx results and plans for abx LOT  Marisue Humble, PharmD Clinical Pharmacist [facility]

## 2012-05-29 NOTE — Evaluation (Signed)
Physical Therapy Evaluation Patient Details Name: Tammy Roman MRN: [id number] DOB: 06-Oct-1980 Today's Date: 05/29/2012 Time: 4098-1191 PT Time Calculation (min): 41 min  PT Assessment / Plan / Recommendation Clinical Impression  Pt is a very pleasent and motivated 32 y.o. female 3 days s/p vaginal delivery who presents with increased SI joint pain, weakness, and immobility.  Patient extremely limited.  Patient will benefit from continued skilled PT to address deficits and maximize function in preparation for d/c home.  Patient has good family support and can make home modifications for 1st level home arrangements.   Assisted patient with self care and hygiene prior to and during session.     PT Assessment  Patient needs continued PT services    Follow Up Recommendations  Supervision/Assistance - 24 hour (Will follow up with further assessment for plan of care.)          Equipment Recommendations  Rolling walker with 5" wheels (Bariatric size; 3 in 1 commode)    Recommendations for Other Services OT consult   Frequency Min 3X/week    Precautions / Restrictions     Pertinent Vitals/Pain 7/10      Mobility  Bed Mobility Bed Mobility: Rolling Left;Left Sidelying to Sit;Sit to Sidelying Left;Scooting to [facility] Rolling Left: 2: Max assist Left Sidelying to Sit: 2: Max assist Sit to Sidelying Left: 2: Max assist Scooting to HOB: 1: +2 Total assist Scooting to [facility]: Patient Percentage: 40% Transfers Transfers: Not assessed Ambulation/Gait Ambulation/Gait Assistance: Not tested (comment)           PT Diagnosis: Difficulty walking;Abnormality of gait;Acute pain;Generalized weakness  PT Problem List:   PT Treatment Interventions: DME instruction;Gait training;Stair training;Functional mobility training;Therapeutic activities;Therapeutic exercise;Patient/family education   PT Goals Acute Rehab PT Goals PT Goal Formulation: With patient Time For Goal Achievement:  06/05/12 Potential to Achieve Goals: Fair Pt will go Supine/Side to Sit: with min assist Pt will go Sit to Stand: with min assist Pt will Ambulate: 16 - 50 feet;with supervision Pt will Go Up / Down Stairs: Flight;with min assist (long term goal (can enter home without steps))  Visit Information  Last PT Received On: 05/29/12 Assistance Needed: +2    Subjective Data  Subjective: I really want to go home to see my baby Patient Stated Goal: go home   Prior Functioning  Home Living Lives With: Spouse;Family Available Help at Discharge: Family Type of Home: House Home Access: Stairs to enter Secretary/administrator of Steps: 1 Entrance Stairs-Rails: None Home Layout: Two level;Bed/bath upstairs Alternate Level Stairs-Number of Steps: 15 Alternate Level Stairs-Rails: Right;Left Bathroom Shower/Tub: Tub/shower unit;Walk-in shower Bathroom Toilet: Standard Home Adaptive Equipment: None Prior Function Level of Independence: Independent Able to Take Stairs?: Yes Driving: Yes Vocation: Full time employment Dominant Hand: Right    Cognition  Overall Cognitive Status: Appears within functional limits for tasks assessed/performed Arousal/Alertness: Awake/alert Orientation Level: Appears intact for tasks assessed;Oriented X4 / Intact Behavior During Session: [facility] for tasks performed    Extremity/Trunk Assessment Right Upper Extremity Assessment RUE ROM/Strength/Tone: [facility] for tasks assessed Left Upper Extremity Assessment LUE ROM/Strength/Tone: [facility] for tasks assessed Right Lower Extremity Assessment RLE ROM/Strength/Tone: Deficits;Unable to fully assess;Due to pain RLE ROM/Strength/Tone Deficits: generalized weakness RLE Sensation: History of peripheral neuropathy ( "heaviness") Left Lower Extremity Assessment LLE ROM/Strength/Tone: Deficits;Unable to fully assess;Due to pain      End of Session PT - End of Session Equipment Utilized During Treatment: Oxygen Activity Tolerance:  Patient limited by fatigue;Patient limited by pain Patient  left: in bed;with call bell/phone within reach;with family/visitor present Nurse Communication: Mobility status  GP     Fabio Asa 05/29/2012, 2:44 PM  Charlotte Crumb, PT DPT  [phone]

## 2012-05-29 NOTE — Progress Notes (Signed)
INFECTIOUS DISEASE PROGRESS NOTE  ID: Tammy Roman is a 32 y.o. female with   Active Problems:  Sepsis  Hypokalemia  Leukocytosis  Anemia  Subjective: Feels better, has gotten up and walked.   Abtx:  Anti-infectives     Start     Dose/Rate Route Frequency Ordered Stop   05/29/12 1900   vancomycin (VANCOCIN) 1,250 mg in sodium chloride 0.9 % 250 mL IVPB        1,250 mg 166.7 mL/hr over 90 Minutes Intravenous Every 8 hours 05/29/12 1153     05/27/12 1000   vancomycin (VANCOCIN) 1,500 mg in sodium chloride 0.9 % 500 mL IVPB  Status:  Discontinued        1,500 mg 250 mL/hr over 120 Minutes Intravenous Every 12 hours 05/26/12 1929 05/29/12 1153   05/27/12 0330  piperacillin-tazobactam (ZOSYN) IVPB 3.375 g       3.375 g 12.5 mL/hr over 240 Minutes Intravenous 3 times per day 05/26/12 1926     05/27/12 0000   clindamycin (CLEOCIN) IVPB 600 mg  Status:  Discontinued        600 mg 100 mL/hr over 30 Minutes Intravenous 4 times per day 05/26/12 2045 05/27/12 1752   05/26/12 2045   clindamycin (CLEOCIN) IVPB 300 mg  Status:  Discontinued        300 mg 100 mL/hr over 30 Minutes Intravenous 4 times per day 05/26/12 2041 05/26/12 2045   05/26/12 1930  piperacillin-tazobactam (ZOSYN) IVPB 4.5 g       4.5 g 200 mL/hr over 30 Minutes Intravenous  Once 05/26/12 1904 05/26/12 2256   05/26/12 1915   vancomycin (VANCOCIN) 2,000 mg in sodium chloride 0.9 % 500 mL IVPB        2,000 mg 250 mL/hr over 120 Minutes Intravenous  Once 05/26/12 1904 05/27/12 0059          Medications:  Scheduled:   . piperacillin-tazobactam (ZOSYN)  IV  3.375 g Intravenous Q8H  . sodium chloride  3 mL Intravenous Q12H  . vancomycin  1,250 mg Intravenous Q8H    Objective: Vital signs in last 24 hours: Temp:  [97.7 F (36.5 C)-99 F (37.2 C)] 98.2 F (36.8 C) (01/24 1022) Pulse Rate:  [91-121] 108  (01/24 1022) Resp:  [17-34] 24  (01/24 1022) BP: (112-143)/(58-98) 139/88 mmHg (01/24  1022) SpO2:  [92 %-98 %] 95 % (01/24 1022)   General appearance: alert, cooperative and no distress GI: normal findings: bowel sounds normal and soft, non-tender  Lab Results  Basename 05/28/12 0600 05/27/12 0905  WBC 10.5 16.1*  HGB 11.4* 11.6*  HCT 35.1* 34.7*  NA 140 136  K 3.8 3.4*  CL 105 101  CO2 25 19  BUN 7 8  CREATININE 0.79 0.72  GLU -- --   Liver Panel  Basename 05/27/12 0905  PROT 5.4*  ALBUMIN 1.7*  AST 21  ALT 18  ALKPHOS 134*  BILITOT 0.4  BILIDIR --  IBILI --   Sedimentation Rate No results found for this basename: ESRSEDRATE in the last 72 hours C-Reactive Protein No results found for this basename: CRP:2 in the last 72 hours  Microbiology: Recent Results (from the past 240 hour(s))  URINE CULTURE     Status: Normal   Collection Time   05/26/12 10:20 AM      Component Value Range Status Comment   Specimen Description OB CATHETERIZED   Final    Special Requests NONE   Final  Culture  Setup Time 05/27/2012 01:27   Final    Colony Count NO GROWTH   Final    Culture NO GROWTH   Final    Report Status 05/27/2012 FINAL   Final   CULTURE, BLOOD (ROUTINE X 2)     Status: Normal (Preliminary result)   Collection Time   05/26/12 12:10 PM      Component Value Range Status Comment   Specimen Description BLOOD RIGHT ARM   Final    Special Requests     Final    Value: BOTTLES DRAWN AEROBIC AND ANAEROBIC 6CC EACH BOTTLE   Culture  Setup Time 05/26/2012 17:04   Final    Culture     Final    Value:        BLOOD CULTURE RECEIVED NO GROWTH TO DATE CULTURE WILL BE HELD FOR 5 DAYS BEFORE ISSUING A FINAL NEGATIVE REPORT   Report Status PENDING   Incomplete   CULTURE, BLOOD (ROUTINE X 2)     Status: Normal (Preliminary result)   Collection Time   05/26/12 12:20 PM      Component Value Range Status Comment   Specimen Description BLOOD RIGHT HAND   Final    Special Requests BOTTLES DRAWN AEROBIC ONLY 3CC   Final    Culture  Setup Time 05/26/2012 17:09   Final     Culture     Final    Value:        BLOOD CULTURE RECEIVED NO GROWTH TO DATE CULTURE WILL BE HELD FOR 5 DAYS BEFORE ISSUING A FINAL NEGATIVE REPORT   Report Status PENDING   Incomplete   BODY FLUID CULTURE     Status: Normal (Preliminary result)   Collection Time   05/28/12  5:38 PM      Component Value Range Status Comment   Specimen Description SYNOVIAL FLUID   Final    Special Requests RIGHT SACROILIAC JOINT ASPIRATION   Final    Gram Stain     Final    Value: NO WBC SEEN     NO ORGANISMS SEEN   Culture PENDING   Incomplete    Report Status PENDING   Incomplete     Studies/Results: Ct Aspiration  05/28/2012  *RADIOLOGY REPORT*  CT GUIDED ASPIRATION  Date: May 28, 2012  Clinical History: 32 year old recently postpartum female with acute right sacral ileitis and fever.  Aspiration is requested to evaluate for potential infection.  Procedures Performed: 1. CT guided aspiration of right sacroiliac joint  Interventional Radiologist:  Sterling Big, MD  Sedation: Moderate (conscious) sedation was used.  Four mg Versed, 100 mcg Fentanyl and 30 mg Toradol were administered intravenously. The patient's vital signs were monitored continuously by radiology nursing throughout the procedure.  Sedation Time: 21 minutes  Fluoroscopy time: 10 seconds  Contrast volume: None  PROCEDURE/FINDINGS:   Informed consent was obtained from the patient following explanation of the procedure, risks, benefits and alternatives. The patient understands, agrees and consents for the procedure. All questions were addressed. A time out was performed.  Maximal barrier sterile technique utilized including caps, mask, sterile gowns, sterile gloves, large sterile drape, hand hygiene, and betadine skin prep.  A planning axial CT scan was performed.  The right sacroiliac joint was identified.  An appropriate skin entry site was selected and marked.  Local anesthesia was achieved by infiltration of 1% lidocaine.  Under  direct CT fluoroscopic guidance, a 15 cm 17 gauge trocar needle was advanced into the sacroiliac  joint.  There is no aspirated of fluid.  Therefore, the space was localized with approximately 8 ml of saline and this was re aspirated.  The aspirated saline appeared slightly cloudy with some floating particulate matter.  The patient experienced great pain during the procedure and was very tender despite heavy sedation.  5 ml of lidocaine was then injected to the sacroiliac joint after the aspiration procedure.  The needle was removed.  Overall, the patient tolerated the procedure well.  IMPRESSION:  1.  Technically successful CT-guided aspiration of right sacroiliac joint.  No fluid was aspirated initially, however the space was lavaged with 8 ml of saline returning turbid fluid with some particulate matter.  This was sent for culture.  2.  The patient responded well to IV Toradol for pain control.  Signed,  Sterling Big, MD Vascular & Interventional Radiologist [facility] Radiology   Original Report Authenticated By: Malachy Moan, M.D.      Assessment/Plan: Sacroiliitis  Total days of antibiotics: 4 (vanco/zosyn) Will stop her anbx Her aspirate showed no WBC.  Await Cx, available if questions.         Tammy Roman Infectious Diseases [phone] 05/29/2012, 12:35 PM   LOS: 3 days

## 2012-05-29 NOTE — Progress Notes (Signed)
[facility] PROGRESS NOTE  Tammy Roman ZOX:[id number] DOB: January 16, 1981 DOA: 05/26/2012 PCP: Nani Gasser, MD  Brief narrative 32 year old African American female patient with no significant PMH, S/P vaginal delivery of healthy baby boy on 05/23/12, initially presented with right leg pain which spread to right hip, back and right foot-all started 24 hours post delivery. She was initially seen at [facility]. Neurology consulted. MRI L-spine without gross abnormalities. She was transferred to [facility] on 1/21 for further evaluation (MRI of pelvis) and management.  Assessment/Plan:  SIRS/? Sepsis She was started on broad spectrum IV antibiotics-vancomycin, Zosyn and clindamycin for possible intrapelvic infection. She was aggressively hydrated with IV fluids. MRI pelvis does not show intrapelvic collection. It does show persistent asymmetric but nonspecific edema involving the right iliacus, piriformis and gluteus musculature and right sacroiliac joint which demonstrates mild asymmetric hyperintensity, early sacroiliitis is of concern (potentially infectious) and no pelvic DVT (discussed findings with the reading radiologist). Blood cultures- NTD. Urine culture negative. ID and OB/GYN input appreciated. Interventional radiology aspirated right sacroiliac joint-fluid showed no WBC organisms and cultures pending. Patient significantly better-no fever, leukocytosis resolved and symptomatically much improved. ID has seen and discontinued antibiotics. DC Dilaudid PCA, mobilize patient and monitor overnight and if stable, DC home on 1/25  Hypokalemia Repleted  Leukocytosis Secondary to SIRS. Normalized  Anemia Stable.    Code Status: Full Family Communication: Discussed with spouse at bedside. Disposition Plan: Home when medically stable.   Consultants:  Infectious disease  Interventional radiology  Procedures:  None  Antibiotics:  IV Zosyn 1/21 >1/24  IV  vancomycin 1/21 >1/24  IV clindamycin 1/21 >1/22  HPI/Subjective:  Complaints Right sacral pain has significantly improved-was able to sit with PT. Tolerating by mouth. Denies any other complaints. Eager to go home.  Objective: Filed Vitals:   05/29/12 0700 05/29/12 0800 05/29/12 1022 05/29/12 1409  BP: 112/70  139/88 133/69  Pulse: 91  108 89  Temp: 97.9 F (36.6 C)  98.2 F (36.8 C) 98.7 F (37.1 C)  TempSrc: Oral  Oral Oral  Resp: 22 20 24 20   Height:      Weight:      SpO2: 97% 97% 95% 98%    Intake/Output Summary (Last 24 hours) at 05/29/12 1430 Last data filed at 05/29/12 1409  Gross per 24 hour  Intake 1731.33 ml  Output   1250 ml  Net 481.33 ml   Filed Weights   05/26/12 1824  Weight: 131.543 kg (290 lb)    Exam: Patient was examined with her female nurse in room   General exam: Morbidly obese female lying supine in bed, Pleasant and comfortable.  Respiratory system: Clear to auscultation. No increased work of breathing.  Cardiovascular system: S1 and S2 heard, regular. No JVD, murmurs or pedal edema.  Gastrointestinal system: Abdomen is nondistended, soft and nontender. Uterus palpable suprapubically. Normal bowel sounds heard.  Central nervous system: Alert and oriented. No focal neurological deficits.  Extremities: Symmetric 5/5 power. No acute findings right hip.  Data Reviewed: Basic Metabolic Panel:  Lab 05/28/12 8119 05/27/12 0905  NA 140 136  K 3.8 3.4*  CL 105 101  CO2 25 19  GLUCOSE 129* 73  BUN 7 8  CREATININE 0.79 0.72  CALCIUM 8.7 8.5  MG -- --  PHOS -- --   Liver Function Tests:  Lab 05/27/12 0905  AST 21  ALT 18  ALKPHOS 134*  BILITOT 0.4  PROT 5.4*  ALBUMIN 1.7*   No results found  for this basename: LIPASE:5,AMYLASE:5 in the last 168 hours No results found for this basename: AMMONIA:5 in the last 168 hours CBC:  Lab 05/28/12 0600 05/27/12 0905 05/26/12 0110 05/24/12 0540 05/23/12 1319  WBC 10.5 16.1* 14.1*  19.7* 11.7*  NEUTROABS 8.1* -- -- -- --  HGB 11.4* 11.6* 11.0* 10.8* 11.7*  HCT 35.1* 34.7* 34.0* 33.4* 36.2  MCV 83.4 82.2 84.0 84.6 84.4  PLT 218 215 170 163 178   Cardiac Enzymes: No results found for this basename: CKTOTAL:5,CKMB:5,CKMBINDEX:5,TROPONINI:5 in the last 168 hours BNP (last 3 results) No results found for this basename: PROBNP:3 in the last 8760 hours CBG: No results found for this basename: GLUCAP:5 in the last 168 hours  Recent Results (from the past 240 hour(s))  URINE CULTURE     Status: Normal   Collection Time   05/26/12 10:20 AM      Component Value Range Status Comment   Specimen Description OB CATHETERIZED   Final    Special Requests NONE   Final    Culture  Setup Time 05/27/2012 01:27   Final    Colony Count NO GROWTH   Final    Culture NO GROWTH   Final    Report Status 05/27/2012 FINAL   Final   CULTURE, BLOOD (ROUTINE X 2)     Status: Normal (Preliminary result)   Collection Time   05/26/12 12:10 PM      Component Value Range Status Comment   Specimen Description BLOOD RIGHT ARM   Final    Special Requests     Final    Value: BOTTLES DRAWN AEROBIC AND ANAEROBIC 6CC EACH BOTTLE   Culture  Setup Time 05/26/2012 17:04   Final    Culture     Final    Value:        BLOOD CULTURE RECEIVED NO GROWTH TO DATE CULTURE WILL BE HELD FOR 5 DAYS BEFORE ISSUING A FINAL NEGATIVE REPORT   Report Status PENDING   Incomplete   CULTURE, BLOOD (ROUTINE X 2)     Status: Normal (Preliminary result)   Collection Time   05/26/12 12:20 PM      Component Value Range Status Comment   Specimen Description BLOOD RIGHT HAND   Final    Special Requests BOTTLES DRAWN AEROBIC ONLY 3CC   Final    Culture  Setup Time 05/26/2012 17:09   Final    Culture     Final    Value:        BLOOD CULTURE RECEIVED NO GROWTH TO DATE CULTURE WILL BE HELD FOR 5 DAYS BEFORE ISSUING A FINAL NEGATIVE REPORT   Report Status PENDING   Incomplete   BODY FLUID CULTURE     Status: Normal (Preliminary  result)   Collection Time   05/28/12  5:38 PM      Component Value Range Status Comment   Specimen Description SYNOVIAL FLUID   Final    Special Requests RIGHT SACROILIAC JOINT ASPIRATION   Final    Gram Stain     Final    Value: NO WBC SEEN     NO ORGANISMS SEEN   Culture NO GROWTH   Final    Report Status PENDING   Incomplete      Studies: Ct Aspiration  05/28/2012  *RADIOLOGY REPORT*  CT GUIDED ASPIRATION  Date: May 28, 2012  Clinical History: 32 year old recently postpartum female with acute right sacral ileitis and fever.  Aspiration is requested to evaluate for potential infection.  Procedures Performed: 1. CT guided aspiration of right sacroiliac joint  Interventional Radiologist:  Sterling Big, MD  Sedation: Moderate (conscious) sedation was used.  Four mg Versed, 100 mcg Fentanyl and 30 mg Toradol were administered intravenously. The patient's vital signs were monitored continuously by radiology nursing throughout the procedure.  Sedation Time: 21 minutes  Fluoroscopy time: 10 seconds  Contrast volume: None  PROCEDURE/FINDINGS:   Informed consent was obtained from the patient following explanation of the procedure, risks, benefits and alternatives. The patient understands, agrees and consents for the procedure. All questions were addressed. A time out was performed.  Maximal barrier sterile technique utilized including caps, mask, sterile gowns, sterile gloves, large sterile drape, hand hygiene, and betadine skin prep.  A planning axial CT scan was performed.  The right sacroiliac joint was identified.  An appropriate skin entry site was selected and marked.  Local anesthesia was achieved by infiltration of 1% lidocaine.  Under direct CT fluoroscopic guidance, a 15 cm 17 gauge trocar needle was advanced into the sacroiliac joint.  There is no aspirated of fluid.  Therefore, the space was localized with approximately 8 ml of saline and this was re aspirated.  The aspirated saline  appeared slightly cloudy with some floating particulate matter.  The patient experienced great pain during the procedure and was very tender despite heavy sedation.  5 ml of lidocaine was then injected to the sacroiliac joint after the aspiration procedure.  The needle was removed.  Overall, the patient tolerated the procedure well.  IMPRESSION:  1.  Technically successful CT-guided aspiration of right sacroiliac joint.  No fluid was aspirated initially, however the space was lavaged with 8 ml of saline returning turbid fluid with some particulate matter.  This was sent for culture.  2.  The patient responded well to IV Toradol for pain control.  Signed,  Sterling Big, MD Vascular & Interventional Radiologist [facility] Radiology   Original Report Authenticated By: Malachy Moan, M.D.     Scheduled Meds:    . sodium chloride  3 mL Intravenous Q12H   Continuous Infusions:    . sodium chloride 20 mL (05/29/12 0014)    Active Problems:  Sepsis  Hypokalemia  Leukocytosis  Anemia    Time spent: 35 minutes    [facility]  [facility] Pager [phone]. If 8PM-8AM, please contact night-coverage at [email or web address], password [facility] 05/29/2012, 2:30 PM  LOS: 3 days

## 2012-05-29 NOTE — Progress Notes (Signed)
Reviewed and agree with notations and assessments for plan of care.  Charlotte Crumb, PT DPT  [phone]

## 2012-05-29 NOTE — Progress Notes (Signed)
Physical Therapy Treatment Patient Details Name: Tammy Roman MRN: [id number] DOB: Jul 08, 1980 Today's Date: 05/29/2012 Time: 1120-1201 PT Time Calculation (min): 41 min  PT Assessment / Plan / Recommendation Comments on Treatment Session  Pt is a 32 year old female that is 5 days post partum with intractable hip/right leg pain. Seen for second time today with evaluating PT Charlotte Crumb, PT) to look at pt's pelvic alignment, look to see if a rotation is present and intiate treatment for rotation as indicated. Pt demo'd a + long sit test for right side posterior inominate rotation. Initatied muscle energy techniques to correct the rotation with a notable decrease in rotation with repeated long sit test. Pt and spouse education on this dysfunction and the treatment for it. Will plan to see pt in am tomorrow to further work on her rotation and mobility.                            Follow Up Recommendations  Supervision/Assistance - 24 hour;Home health PT           Equipment Recommendations  Rolling walker with 5" wheels;Other (comment) (bariatric size, 3n1, possible wheelchair)    Recommendations for Other Services OT consult  Frequency Min 3X/week   Plan Discharge plan remains appropriate;Frequency remains appropriate    Precautions / Restrictions Precautions Precautions: Other (comment) Precaution Comments: post-partum x5 days        Mobility  Bed Mobility Bed Mobility: Rolling Right Rolling Right: 3: Mod assist Rolling Left: 2: Max assist Left Sidelying to Sit: 2: Max assist Sit to Sidelying Left: 3: Mod assist;HOB flat;With rail Scooting to HOB: 1: +2 Total assist Scooting to [facility]: Patient Percentage: 50% Details for Bed Mobility Assistance: increased time needed with all movements with pt stating "I can do this, lord help me do this". cues for technique with lying down and for pt to use arms and legs to assist with scooting her up in the bed at end of  session. Transfers Transfers: Sit to Stand;Stand to Sit;Stand Pivot Transfers Sit to Stand: 1: +2 Total assist;From chair/3-in-1;With upper extremity assist;With armrests Sit to Stand: Patient Percentage: 50% Stand to Sit: 1: +2 Total assist;To bed;With upper extremity assist Stand to Sit: Patient Percentage: 60% Stand Pivot Transfers: 1: +2 Total assist Stand Pivot Transfers: Patient Percentage: 60% Details for Transfer Assistance: increased time needed. pt with heavy reliance on her arms to decr wt through legs (will try walker next visit). use bil forearm/HHA for stand/pivot transfer with cues for posture and hand placement with transfers. Ambulation/Gait Ambulation/Gait Assistance: Not tested (comment)    Exercises Other Exercises Other Exercises: Long sit test perfomed to look at pelvic alignment. Pt demo'd posterior inominate rotation on the right side. Perfomed muscle energy techniques to ilicit left hamstring activation (pt too painful to activate right quads at this time). Decr rotation noted with follow up long sit testing.                   PT Diagnosis: Difficulty walking;Abnormality of gait;Acute pain;Generalized weakness  PT Problem List:   PT Treatment Interventions: Manual techniques   PT Goals Acute Rehab PT Goals PT Goal Formulation: With patient Time For Goal Achievement: 06/05/12 Potential to Achieve Goals: Fair Pt will go Supine/Side to Sit: with min assist PT Goal: Supine/Side to Sit - Progress: Progressing toward goal Pt will go Sit to Stand: with min assist PT Goal: Sit to Stand - Progress:  Progressing toward goal Pt will Ambulate: 16 - 50 feet;with supervision PT Goal: Ambulate - Progress: Progressing toward goal Pt will Go Up / Down Stairs: Flight;with min assist (long term goal (can enter home without steps))  Visit Information  Last PT Received On: 05/29/12 Assistance Needed: +2    Subjective Data  Subjective: No new complaints, up to bsc on arrival  to room. Patient Stated Goal: go home   Cognition  Overall Cognitive Status: Appears within functional limits for tasks assessed/performed Arousal/Alertness: Awake/alert Orientation Level: Appears intact for tasks assessed Behavior During Session: [facility] for tasks performed       End of Session PT - End of Session Equipment Utilized During Treatment: Gait belt;Oxygen Activity Tolerance: Patient limited by pain;Patient limited by fatigue Patient left: in bed;with call bell/phone within reach;with family/visitor present Nurse Communication: Mobility status   GP     Sallyanne Kuster 05/29/2012, 3:31 PM  Sallyanne Kuster, PTA Office- [phone]

## 2012-05-30 MED ORDER — VALACYCLOVIR HCL 500 MG PO TABS: 500.0000 mg | ORAL_TABLET | Freq: Every day | ORAL | Status: DC

## 2012-05-30 MED ORDER — TRAMADOL HCL 50 MG PO TABS: 50.0000 mg | ORAL_TABLET | Freq: Four times a day (QID) | ORAL | Status: DC | PRN

## 2012-05-30 NOTE — Progress Notes (Signed)
Physical Therapy Treatment Patient Details Name: Tammy Roman MRN: [id number] DOB: December 27, 1980 Today's Date: 05/30/2012 Time: 0925-0950 PT Time Calculation (min): 25 min  PT Assessment / Plan / Recommendation Comments on Treatment Session  Pt is a 32 year old female that is now 6 days post partum. Continues to demonstate a right inominant posterior rotation with testing. Corrected with muscle energy techniques. Pt is moving much better today with less pain. Ready for discharge home with family support and home health PT to continue to work on SI dysfunction. Making progress toward her goals today with less assistance needed and incr mobility performed.                                  Equipment Recommendations  Rolling walker with 5" wheels;Wheelchair (measurements PT);Wheelchair cushion (measurements PT);Other (comment) (3n1, SI belt and bariatric size equipment)    Recommendations for Other Services OT consult  Frequency Min 3X/week   Plan Discharge plan remains appropriate;Frequency remains appropriate    Precautions / Restrictions Precautions Precautions: Other (comment);Fall Precaution Comments: post-partum x6 days        Mobility  Bed Mobility Bed Mobility: Sitting - Scoot to Edge of Bed Rolling Left: 5: Supervision;With rail Left Sidelying to Sit: 5: Supervision;HOB flat;With rails Sit to Sidelying Left: 5: Supervision;HOB flat Details for Bed Mobility Assistance: pt able to move self in bed to sit on edge of bed with cues for technique and increased time for mobility Transfers Sit to Stand: 4: Min guard;From bed;With upper extremity assist Stand to Sit: 4: Min guard;To bed;With upper extremity assist Details for Transfer Assistance: cues for ant wt shift to assist with standing and for hand placment. pt does place one hand on walker, however spouse stabilizes other side to incr safety with this technique. Ambulation/Gait Ambulation/Gait Assistance: 4: Min  guard Ambulation Distance (Feet): 4 Feet Assistive device: Rolling walker Ambulation/Gait Assistance Details: cues for posture, sequence with gait to decr wt on right leg for incr gait tolerance. Gait Pattern: Step-to pattern;Decreased stance time - right;Decreased step length - right;Decreased step length - left;Trunk flexed;Antalgic    Exercises Other Exercises Other Exercises: Performed long sit test prior to out of bed with + for right inominate posterior rotation. Performed manual muscle energy techniques to activate isometic left hamstring and isometric right quads to correct rotation. Pt reported decr pain after muscle energy techniques. Better alingment demonstrated with a (-) follow up long sit test.                         PT Goals Acute Rehab PT Goals PT Goal: Supine/Side to Sit - Progress: Met PT Goal: Sit to Stand - Progress: Met PT Goal: Ambulate - Progress: Progressing toward goal  Visit Information  Last PT Received On: 05/30/12 Assistance Needed: +1    Subjective Data  Subjective: Reports having less pain since yesterday's session and being able to lift her hips in bed better. Agreeable to therapy and out of bed.   Cognition  Overall Cognitive Status: Appears within functional limits for tasks assessed/performed Arousal/Alertness: Awake/alert Orientation Level: Appears intact for tasks assessed Behavior During Session: [facility] for tasks performed       End of Session PT - End of Session Equipment Utilized During Treatment: Gait belt Activity Tolerance: Patient tolerated treatment well;Patient limited by pain Patient left: in bed;with call bell/phone within reach;with family/visitor present Nurse Communication:  Mobility status   GP     Sallyanne Kuster 05/30/2012, 10:12 AM Sallyanne Kuster, PTA Office- [phone]

## 2012-05-30 NOTE — Progress Notes (Signed)
Orthopedic Tech Progress Note Patient Details:  Tammy Roman [birth year] [id number]  Patient ID: Tammy Roman, female   DOB: 1980-09-22, 31 y.o.   MRN: [id number]   [name] 05/30/2012, 4:31 PM SI BELT COMPLETED BY [employer].

## 2012-05-30 NOTE — Discharge Summary (Addendum)
Physician Discharge Summary  Tammy Roman ZOX:[id number] DOB: 12-Nov-1980 DOA: 05/26/2012  PCP: Nani Gasser, MD  Admit date: 05/26/2012 Discharge date: 05/30/2012  Time spent: Greater than 30 minutes  Recommendations for Outpatient Follow-up:  1. With Dr. Nani Gasser, PCP in 1 week from hospital discharge. 2. With Dr. Zelphia Cairo, OBGyn  Discharge Diagnoses:  Principal Problem:  *SIRS (systemic inflammatory response syndrome) Active Problems:  Sepsis  Hypokalemia  Leukocytosis  Anemia   Discharge Condition: Improved and stable  Diet recommendation: Regular diet.  Filed Weights   05/26/12 1824  Weight: 131.543 kg (290 lb)    History of present illness:  32 year old African American female patient with no significant PMH, S/P vaginal delivery of healthy baby boy on 05/23/12, initially presented with right leg pain which spread to right hip, back and right foot-all started 24 hours post delivery. She was initially seen at [facility]. Neurology consulted. MRI L-spine without gross abnormalities. She was transferred to [facility] on 1/21 for further evaluation (MRI of pelvis) and management.  Hospital Course:   SIRS  Patient was admitted. She had high fever, tachycardia and leukocytosis. Cultures were obtained and she was started on broad spectrum IV antibiotics-vancomycin, Zosyn and clindamycin for possible intrapelvic infection. She was aggressively hydrated with IV fluids and Dilaudid PCA and NSAID's for pain control. MRI pelvis did not show intrapelvic collection. It did show persistent asymmetric but nonspecific edema involving the right iliacus, piriformis and gluteus musculature and right sacroiliac joint which demonstrates mild asymmetric hyperintensity, early sacroiliitis is of concern (potentially infectious) and no pelvic DVT. Blood cultures- NTD. Urine culture negative. ID and OB/GYN consulted. Per ID recommendations, Interventional radiology  aspirated right sacroiliac joint-fluid showed no WBC organisms and cultures Neg to date. All antibiotics were discontinued approximately 24 hours ago. Patient significantly better-no fever, leukocytosis resolved and symptomatically much improved. PT OT have seen and recommend HH OT, PT, 24 hour supervision(spouse will provide), DME(walker, Wheelchair, Wheelchair cushion, 3n1 & SI belt). She is advised to follow up with PCP and may consider OP Orthopedic consultation as deemed necessary.  Sacroiliitis Unclear etiology. Please see discussion above. Significantly improved. Gradually increasing mobility.  Hypokalemia  Repleted   Leukocytosis  Secondary to SIRS. Normalized   Anemia  Stable.   Consultants:  Infectious disease  Interventional radiology  Procedures:  CT Aspiration of right SI joint.   Discharge Exam:  Much improved pain Right hip area. IV dislodged last pm and pain controlled with oral medications alone. Eager to go home.  Filed Vitals:   05/29/12 1409 05/29/12 2106 05/30/12 0510 05/30/12 1357  BP: 133/69 125/75 132/67 125/74  Pulse: 89 102 87 97  Temp: 98.7 F (37.1 C) 99 F (37.2 C) 97.9 F (36.6 C) 98.4 F (36.9 C)  TempSrc: Oral Oral Oral Oral  Resp: 20 18 18 18   Height:      Weight:      SpO2: 98% 93% 91% 94%    General exam: Morbidly obese female lying supine in bed, Pleasant and comfortable.  Respiratory system: Clear to auscultation. No increased work of breathing.  Cardiovascular system: S1 and S2 heard, regular. No JVD, murmurs or pedal edema.  Gastrointestinal system: Abdomen is nondistended, soft and nontender. Uterus palpable suprapubically-gradually receding into pelvis. Normal bowel sounds heard.  Central nervous system: Alert and oriented. No focal neurological deficits.  Extremities: Symmetric 5/5 power. No acute findings right hip.  Discharge Instructions      Discharge Orders    Future Orders Please  Complete By Expires   Diet general       Increase activity slowly      Call MD for:  temperature >100.4      Call MD for:  severe uncontrolled pain      Call MD for:  redness, tenderness, or signs of infection (pain, swelling, redness, odor or green/yellow discharge around incision site)          Medication List     As of 05/30/2012  3:39 PM    STOP taking these medications         cyclobenzaprine 10 MG tablet   Commonly known as: FLEXERIL      oxyCODONE-acetaminophen 5-325 MG per tablet   Commonly known as: PERCOCET/ROXICET      TAKE these medications         ibuprofen 800 MG tablet   Commonly known as: ADVIL,MOTRIN   Take 800 mg by mouth every 8 (eight) hours as needed. For pain.      prenatal multivitamin Tabs   Take 1 tablet by mouth every morning.      traMADol 50 MG tablet   Commonly known as: ULTRAM   Take 1 tablet (50 mg total) by mouth every 6 (six) hours as needed (Moderate-severe pain.).      valACYclovir 500 MG tablet   Commonly known as: VALTREX   Take 1 tablet (500 mg total) by mouth daily. This is not a new medication for patient. She has these and has been taking.         Follow-up Information    Follow up with METHENEY,CATHERINE, MD. Schedule an appointment as soon as possible for a visit in 1 week.   Contact information:   [address] [place] [address] [phone]       Schedule an appointment as soon as possible for a visit with ADKINS,GRETCHEN, MD.   Contact information:   [address] [place], [address] [place] [address] [phone]           The results of significant diagnostics from this hospitalization (including imaging, microbiology, ancillary and laboratory) are listed below for reference.    Significant Diagnostic Studies: Mr Lumbar Spine Wo Contrast  05/25/2012  *RADIOLOGY REPORT*  Clinical Data: 2 days post vaginal birth.  Extreme pain down right leg for past 24 hours.  MRI LUMBAR SPINE WITHOUT CONTRAST  Technique:  Multiplanar  and multiecho pulse sequences of the lumbar spine were obtained without intravenous contrast.  Comparison: None.  Findings: Last fully open disc space is labeled L5-S1.  Present examination incorporates from T9-10 through the S3 level.  Conus L1-2 level.  Signal drop-off T9-L1 limits evaluation.  No gross abnormality of the distal cord or conus is noted.  Decreased signal intensity of bone marrow most likely related to the recent postpartum state.  Underlying anemia not excluded.  No evidence of right-sided disc herniation.  At the L5-S1 level, there is a mild bulge/shallow protrusion without mass effect.  Nonspecific edema/hemorrhage anterior to the right aspect of the sacrum involving the right iliacus and psoas muscle.  This may represent hemorrhage from recent delivery. Nerve root injury with leakage of cerebrospinal fluid or result of venous thrombosis not excluded  although would be unusual.  Infection not entirely excluded in the proper clinical setting.  MR pelvis can be obtained for further delineation.  Enlarged uterus consistent with recent postpartum state.  IMPRESSION: Evaluation of the distal cord and conus is limited without gross  abnormality detected.  No right-sided disc herniation causing nerve root compression identified.  Nonspecific edema/hemorrhage anterior to the right aspect of the sacrum involving the right iliacus and psoas muscle.  Please see above. M r pelvis to be obtained for further delineation.  Critical Value/emergent results were called by telephone at the time of interpretation on 05/25/2012 at 6:15 p.m. to Dr. Langston Masker, who verbally acknowledged these results.   Original Report Authenticated By: Lacy Duverney, M.D.    Mr Pelvis Wo Contrast  05/27/2012  *RADIOLOGY REPORT*  Clinical Data: Right pelvic and leg pain for 2 days status post vaginal delivery.  MRI PELVIS WITHOUT CONTRAST  Technique:  Multi-planar multi-sequence MR imaging of the pelvis was performed following the  standard protocol. No intravenous contrast was administered.  Comparison: Lumbar MRI 05/25/2012.  Findings: There is stable postpartum enlargement of the uterus which measures approximately 20 cm in length.  There is some fluid within the endocervical canal.  Urinary bladder is decompressed by a Foley catheter.  As demonstrated on the recent lumbar MRI, there is nonspecific presacral edema and ill-defined fluid which extends asymmetrically towards the right along the iliacus and piriformis muscles. There is some superior extension into the retroperitoneum as well as a small amount of free pelvic fluid.  No paraspinal component is evident.  There is no focal fluid collection.  There is also asymmetric edema and ill-defined fluid within the gluteus musculature, right greater than left.  The gluteus tendons appear intact.  The iliopsoas and hamstring tendons are intact.  There is mildly asymmetric hyperintensity within the right sacroiliac joint which does not appear significantly widened. There is no adjacent erosive change or subchondral edema, although the soft tissue findings directly abut the right sacroiliac joint. The symphysis pubis is intact.  The remainder of the bony pelvis appears normal.  There is no hip joint effusion or femoral head abnormality.  Appropriate flow voids are present within the iliac and femoral vasculature bilaterally.  IMPRESSION:  1.  As demonstrated on recent lumbar MRI, there is persistent asymmetric but nonspecific edema involving the right iliacus, piriformis and gluteus musculature. As this process abuts the right sacroiliac joint which demonstrates mild asymmetric hyperintensity, early sacroiliitis is of concern (potentially infectious).  There is no evidence of osteomyelitis. 2.  No drainable fluid collection or paraspinal component identified. 3.  No evidence of pelvic DVT on routine imaging. 4.  Stable postpartum enlargement of the uterus.  These results will be called to the  ordering clinician or representative by the Radiologist Assistant, and communication documented in the PACS Dashboard.   Original Report Authenticated By: Carey Bullocks, M.D.    Ct Aspiration  05/28/2012  *RADIOLOGY REPORT*  CT GUIDED ASPIRATION  Date: May 28, 2012  Clinical History: 32 year old recently postpartum female with acute right sacral ileitis and fever.  Aspiration is requested to evaluate for potential infection.  Procedures Performed: 1. CT guided aspiration of right sacroiliac joint  Interventional Radiologist:  Sterling Big, MD  Sedation: Moderate (conscious) sedation was used.  Four mg Versed, 100 mcg Fentanyl and 30 mg Toradol were administered intravenously. The patient's vital signs were monitored continuously by radiology nursing throughout the procedure.  Sedation Time: 21 minutes  Fluoroscopy time: 10 seconds  Contrast volume: None  PROCEDURE/FINDINGS:   Informed consent was obtained from the patient following explanation of the procedure, risks, benefits and alternatives. The patient understands, agrees and consents for the procedure. All questions were addressed. A time out was performed.  Maximal  barrier sterile technique utilized including caps, mask, sterile gowns, sterile gloves, large sterile drape, hand hygiene, and betadine skin prep.  A planning axial CT scan was performed.  The right sacroiliac joint was identified.  An appropriate skin entry site was selected and marked.  Local anesthesia was achieved by infiltration of 1% lidocaine.  Under direct CT fluoroscopic guidance, a 15 cm 17 gauge trocar needle was advanced into the sacroiliac joint.  There is no aspirated of fluid.  Therefore, the space was localized with approximately 8 ml of saline and this was re aspirated.  The aspirated saline appeared slightly cloudy with some floating particulate matter.  The patient experienced great pain during the procedure and was very tender despite heavy sedation.  5 ml of  lidocaine was then injected to the sacroiliac joint after the aspiration procedure.  The needle was removed.  Overall, the patient tolerated the procedure well.  IMPRESSION:  1.  Technically successful CT-guided aspiration of right sacroiliac joint.  No fluid was aspirated initially, however the space was lavaged with 8 ml of saline returning turbid fluid with some particulate matter.  This was sent for culture.  2.  The patient responded well to IV Toradol for pain control.  Signed,  Sterling Big, MD Vascular & Interventional Radiologist [facility]   Original Report Authenticated By: Malachy Moan, M.D.     Microbiology: Recent Results (from the past 240 hour(s))  URINE CULTURE     Status: Normal   Collection Time   05/26/12 10:20 AM      Component Value Range Status Comment   Specimen Description OB CATHETERIZED   Final    Special Requests NONE   Final    Culture  Setup Time 05/27/2012 01:27   Final    Colony Count NO GROWTH   Final    Culture NO GROWTH   Final    Report Status 05/27/2012 FINAL   Final   CULTURE, BLOOD (ROUTINE X 2)     Status: Normal (Preliminary result)   Collection Time   05/26/12 12:10 PM      Component Value Range Status Comment   Specimen Description BLOOD RIGHT ARM   Final    Special Requests     Final    Value: BOTTLES DRAWN AEROBIC AND ANAEROBIC 6CC EACH BOTTLE   Culture  Setup Time 05/26/2012 17:04   Final    Culture     Final    Value:        BLOOD CULTURE RECEIVED NO GROWTH TO DATE CULTURE WILL BE HELD FOR 5 DAYS BEFORE ISSUING A FINAL NEGATIVE REPORT   Report Status PENDING   Incomplete   CULTURE, BLOOD (ROUTINE X 2)     Status: Normal (Preliminary result)   Collection Time   05/26/12 12:20 PM      Component Value Range Status Comment   Specimen Description BLOOD RIGHT HAND   Final    Special Requests BOTTLES DRAWN AEROBIC ONLY 3CC   Final    Culture  Setup Time 05/26/2012 17:09   Final    Culture     Final    Value:        BLOOD  CULTURE RECEIVED NO GROWTH TO DATE CULTURE WILL BE HELD FOR 5 DAYS BEFORE ISSUING A FINAL NEGATIVE REPORT   Report Status PENDING   Incomplete   BODY FLUID CULTURE     Status: Normal (Preliminary result)   Collection Time   05/28/12  5:38 PM  Component Value Range Status Comment   Specimen Description SYNOVIAL FLUID   Final    Special Requests RIGHT SACROILIAC JOINT ASPIRATION   Final    Gram Stain     Final    Value: NO WBC SEEN     NO ORGANISMS SEEN   Culture NO GROWTH 2 DAYS   Final    Report Status PENDING   Incomplete      Labs: Basic Metabolic Panel:  Lab 05/28/12 1610 05/27/12 0905  NA 140 136  K 3.8 3.4*  CL 105 101  CO2 25 19  GLUCOSE 129* 73  BUN 7 8  CREATININE 0.79 0.72  CALCIUM 8.7 8.5  MG -- --  PHOS -- --   Liver Function Tests:  Lab 05/27/12 0905  AST 21  ALT 18  ALKPHOS 134*  BILITOT 0.4  PROT 5.4*  ALBUMIN 1.7*   No results found for this basename: LIPASE:5,AMYLASE:5 in the last 168 hours No results found for this basename: AMMONIA:5 in the last 168 hours CBC:  Lab 05/28/12 0600 05/27/12 0905 05/26/12 0110 05/24/12 0540  WBC 10.5 16.1* 14.1* 19.7*  NEUTROABS 8.1* -- -- --  HGB 11.4* 11.6* 11.0* 10.8*  HCT 35.1* 34.7* 34.0* 33.4*  MCV 83.4 82.2 84.0 84.6  PLT 218 215 170 163   Cardiac Enzymes: No results found for this basename: CKTOTAL:5,CKMB:5,CKMBINDEX:5,TROPONINI:5 in the last 168 hours BNP: BNP (last 3 results) No results found for this basename: PROBNP:3 in the last 8760 hours CBG: No results found for this basename: GLUCAP:5 in the last 168 hours  Patient has been taking Valtrex for a long time, including during pregnancy. She indicates that she does not plan to breast feed. Defer decision to continue this to her OP PCP/OBGyn.   SignedMarcellus Scott  [facility] 05/30/2012, 3:39 PM

## 2012-05-30 NOTE — Discharge Planning (Signed)
Spoke with OT who saw pt this am with PT and recommends HHOT at discharge.

## 2012-05-30 NOTE — Progress Notes (Signed)
Read and reviewed, in agreement with updates as indicated.  Charlotte Crumb, PT DPT  [phone]

## 2012-05-30 NOTE — Progress Notes (Signed)
Orthopedic Tech Progress Note Patient Details:  Tammy Roman 1980/05/08 [id number]  Patient ID: [name], female   DOB: 1980/06/30, 31 y.o.   MRN: [id number]   [name] 05/30/2012, 1:24 PM CALLED [facility] FOR SI BELT.

## 2012-05-31 LAB — BODY FLUID CULTURE
Culture: NO GROWTH
Gram Stain: NONE SEEN

## 2012-06-01 LAB — CULTURE, BLOOD (ROUTINE X 2): Culture: NO GROWTH

## 2012-06-02 ENCOUNTER — Encounter: Payer: Self-pay | Admitting: Family Medicine

## 2012-06-02 ENCOUNTER — Ambulatory Visit (INDEPENDENT_AMBULATORY_CARE_PROVIDER_SITE_OTHER): Payer: 59 | Admitting: Family Medicine

## 2012-06-02 VITALS — BP 147/94 | HR 85 | Ht 67.0 in | Wt 257.4 lb

## 2012-06-02 DIAGNOSIS — R262 Difficulty in walking, not elsewhere classified: Secondary | ICD-10-CM

## 2012-06-02 DIAGNOSIS — M533 Sacrococcygeal disorders, not elsewhere classified: Secondary | ICD-10-CM

## 2012-06-02 DIAGNOSIS — A419 Sepsis, unspecified organism: Secondary | ICD-10-CM

## 2012-06-02 NOTE — Patient Instructions (Addendum)
Schedule a follow up with Dr. Karie Schwalbe for your SI joint pain

## 2012-06-02 NOTE — Progress Notes (Signed)
  Subjective:    Patient ID: [name], female    DOB: 1980/11/18, 32 y.o.   MRN: [id number]  HPI Still has sig pain in the right hip( posteriorly).  Still hard to walk. Pain stars in buttock area and shot down towards her knee. No numbness or tingling.  Told to use Ibuprofen, Dilaudid when severe and tramadol for more mild pain.  Given a brace belt for low back support. Given a walker and portable potty seat. She was also diagnosed with sepsis while she was there because she had a fever, tachycardia and leukocytosis. Right now she's feeling well. She's been afebrile. She was not to charge home on any antibiotics.   Review of Systems     Objective:   Physical Exam  Constitutional: She is oriented to person, place, and time. She appears well-developed and well-nourished.  Musculoskeletal:       Entire right thigh is swollen compared to the left thigh.  Neurological: She is alert and oriented to person, place, and time.       Pt sitting in wheelchair.  Tender over the right SI joint and into the buttock. Difficulty standing. Significant pain with full flexion. She's able to extend normally. Normal rotation right and left. Hip, knee, ankle strength is 5 out of 5 bilaterally. She has significant pain with flexion of the right hip.  Skin: Skin is warm and dry.  Psychiatric: She has a normal mood and affect. Her behavior is normal.          Assessment & Plan:  SI joint pain - culture was negative.  She still has significant disability. She's having difficulty even walking. The she does have a little bit more range of motion. She's been trying to do some leg lifts in the sitting position and has been able to a little bit more the last couple days. They were supposed to schedule her for in-home physical therapy but no one has contacted them yet. I do not see a referral placed in the computer system. This will order it today and try to get her scheduled ASAP. I think giving [place] a couple  weeks to try to get some pelvic inflammation down will help better localize her pain and see what further treatment she may need to start physical therapy. She's also to continue her anti-inflammatory daily and then use her tramadol or Tylenol for breakthrough pain. She really wanted to followup with an orthopedist as soon as possible so I'm going to have her see my partner Dr. Benjamin Stain next week for further evaluation.  Sepsis-resolved. We'll continue to monitor closely. She's to call if she feels worse or suddenly spiked a fever. I will put her blood work and see if we might need to repeat a CBC or not.

## 2012-06-07 NOTE — Discharge Summary (Signed)
Obstetric Discharge Summary  **Late Entry** Reason for Admission: rupture of membranes Prenatal Procedures: none Intrapartum Procedures: spontaneous vaginal delivery Postpartum Procedures: none Complications-Operative and Postpartum: No obstetric complication; righ lower extremity pain on discharge Hemoglobin  Date Value Range Status  05/28/2012 11.4* 12.0 - 15.0 g/dL Final     HCT  Date Value Range Status  05/28/2012 35.1* 36.0 - 46.0 % Final    Physical Exam:  General: alert, cooperative and appears stated age Lochia: appropriate Uterine Fundus: firm Incision: n/a DVT Evaluation: No evidence of DVT seen on physical exam. Negative Homan's sign. No cords or calf tenderness. Righ hip and thigh with pain to light touch and positive straight leg raise.  No loss of sensation or strength.  Discharge Diagnoses: Term Pregnancy-delivered  Discharge Information: Date: 06/07/2012 Activity: pelvic rest Diet: routine Medications: PNV, Ibuprofen and Percocet Condition: stable Instructions: refer to practice specific booklet Discharge to: F/U for oupt MRI at [facility].  Will contact pt with results and plans for follow-up   Newborn Data: This patient has no babies on file. Home with mother.  Tammy Roman 06/07/2012, 9:56 AM

## 2012-06-08 ENCOUNTER — Encounter: Payer: Self-pay | Admitting: Sports Medicine

## 2012-06-08 ENCOUNTER — Ambulatory Visit (INDEPENDENT_AMBULATORY_CARE_PROVIDER_SITE_OTHER): Payer: 59 | Admitting: Sports Medicine

## 2012-06-08 VITALS — BP 148/98 | HR 90

## 2012-06-08 DIAGNOSIS — M609 Myositis, unspecified: Secondary | ICD-10-CM

## 2012-06-08 DIAGNOSIS — M25551 Pain in right hip: Secondary | ICD-10-CM | POA: Insufficient documentation

## 2012-06-08 DIAGNOSIS — IMO0001 Reserved for inherently not codable concepts without codable children: Secondary | ICD-10-CM

## 2012-06-08 LAB — COMPREHENSIVE METABOLIC PANEL
ALT: 20 U/L (ref 0–35)
AST: 13 U/L (ref 0–37)
Alkaline Phosphatase: 119 U/L — ABNORMAL HIGH (ref 39–117)
Calcium: 9.5 mg/dL (ref 8.4–10.5)
Chloride: 104 mEq/L (ref 96–112)
Creat: 0.82 mg/dL (ref 0.50–1.10)
Total Bilirubin: 0.4 mg/dL (ref 0.3–1.2)

## 2012-06-08 LAB — CBC WITH DIFFERENTIAL/PLATELET
Basophils Absolute: 0 10*3/uL (ref 0.0–0.1)
Basophils Relative: 0 % (ref 0–1)
Eosinophils Absolute: 0.1 K/uL (ref 0.0–0.7)
Eosinophils Relative: 3 % (ref 0–5)
HCT: 39.5 % (ref 36.0–46.0)
Hemoglobin: 12.7 g/dL (ref 12.0–15.0)
Lymphocytes Relative: 37 % (ref 12–46)
Lymphs Abs: 1.7 K/uL (ref 0.7–4.0)
MCH: 26.1 pg (ref 26.0–34.0)
MCHC: 32.2 g/dL (ref 30.0–36.0)
MCV: 81.1 fL (ref 78.0–100.0)
Monocytes Absolute: 0.3 K/uL (ref 0.1–1.0)
Monocytes Relative: 7 % (ref 3–12)
Neutro Abs: 2.4 K/uL (ref 1.7–7.7)
Neutrophils Relative %: 53 % (ref 43–77)
Platelets: 522 10*3/uL — ABNORMAL HIGH (ref 150–400)
RBC: 4.87 MIL/uL (ref 3.87–5.11)
RDW: 15.3 % (ref 11.5–15.5)
WBC: 4.6 10*3/uL (ref 4.0–10.5)

## 2012-06-08 LAB — COMPREHENSIVE METABOLIC PANEL WITH GFR
Albumin: 3.6 g/dL (ref 3.5–5.2)
BUN: 12 mg/dL (ref 6–23)
CO2: 25 meq/L (ref 19–32)
Glucose, Bld: 84 mg/dL (ref 70–99)
Potassium: 4.2 meq/L (ref 3.5–5.3)
Sodium: 138 meq/L (ref 135–145)
Total Protein: 6.5 g/dL (ref 6.0–8.3)

## 2012-06-08 LAB — CK: Total CK: 69 U/L (ref 7–177)

## 2012-06-08 LAB — TSH: TSH: 1.798 u[IU]/mL (ref 0.350–4.500)

## 2012-06-08 LAB — SEDIMENTATION RATE: Sed Rate: 11 mm/h (ref 0–22)

## 2012-06-08 MED ORDER — PREDNISONE 50 MG PO TABS: ORAL_TABLET | ORAL | Status: DC

## 2012-06-08 MED ORDER — AMITRIPTYLINE HCL 50 MG PO TABS: ORAL_TABLET | ORAL | Status: DC

## 2012-06-08 NOTE — Assessment & Plan Note (Addendum)
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 Overall symptoms are headed in the right direction. SI joint exam is fairly benign today and I do not think this represents SI joint pathology. She does have an L5-S1 disc degeneration with out any foraminal stenosis. I am going to try some prednisone, and amitriptyline at bedtime. She will be starting physical therapy at home, and I want him to focus extensively on hip flexor rehabilitation. I would like to see her back in 2-3 weeks to see how things are going. We could certainly consider gabapentin and diagnostic SI joint injection at future visits. I am going to check some blood work.

## 2012-06-08 NOTE — Progress Notes (Signed)
  Subjective:    I'm seeing this patient as a consultation for:  Dr. Linford Arnold  CC: Right hip pain  HPI: This is a very pleasant 32 year old female with a very complicated postpartum history. She delivered her baby by normal spontaneous vaginal delivery, the baby is very healthy. Unfortunately, the epidural did not work very well. The day after the delivery, approximately 24 hours, she recalls stepping out onto her feet, she felt a sharp pain radiating from her right buttock down her right leg in the posterior aspect of her right foot.  The pain persisted and was sharp. Afterwards she had continued pain and she localized in the left buttock radiating down the posterior thigh not past the knee. The pain is particularly bad sitting, and somewhat better with standing the weight-bearing was also quite painful. There was a suspicion for septic SI joint arthritis, and she had a CT-guided aspiration which was negative. She's also been on multiple courses of antibiotics, and is currently on narcotics but has never had any steroids. She is not currently breast-feeding.   Past medical history, Surgical history, Family history not pertinant except as noted below, Social history, Allergies, and medications have been entered into the medical record, reviewed, and no changes needed.   Review of Systems: No headache, visual changes, nausea, vomiting, diarrhea, constipation, dizziness, abdominal pain, skin rash, fevers, chills, night sweats, weight loss, swollen lymph nodes, body aches, joint swelling, muscle aches, chest pain, shortness of breath, mood changes, visual or auditory hallucinations.   Objective:   General: Well Developed, well nourished, and in no acute distress.  Neuro/Psych: Alert and oriented x3, extra-ocular muscles intact, able to move all 4 extremities, sensation grossly intact. Skin: Warm and dry, no rashes noted.  Respiratory: Not using accessory muscles, speaking in full sentences, trachea  midline.  Cardiovascular: Pulses palpable, no extremity edema. Abdomen: Does not appear distended. Right Hip: ROM IR: 45 Deg, ER: 45 Deg, Flexion: 120 Deg, Extension: 100 Deg, Abduction: 45 Deg, Adduction: 45 Deg Strength IR: 4/5, ER: 4/5, Flexion: 2+/5, Extension: 4/5, Abduction: 4/5, Adduction: 4/5 Pelvic alignment unremarkable to inspection and palpation. Standing hip rotation and gait without trendelenburg sign / unsteadiness. Greater trochanter without tenderness to palpation. No tenderness over piriformis and greater trochanter. No pain with FABER or FADIR. No SI joint tenderness and normal minimal SI movement. Back Exam:  Inspection: Unremarkable  Motion: Flexion 45 deg, Extension 45 deg, Side Bending to 45 deg bilaterally,  Rotation to 45 deg bilaterally  SLR laying: Negative  XSLR laying: Negative  Palpable tenderness: None. FABER: negative. Sensory change: Gross sensation intact to all lumbar and sacral dermatomes.  Reflexes: 2+ at both patellar tendons, 2+ at achilles tendons, Babinski's downgoing.  Strength at foot  Plantar-flexion: 5/5 Dorsi-flexion: 5/5 Eversion: 5/5 Inversion: 5/5  Leg strength  Quad: 5/5 Hamstring: 2+/5 Hip flexor: 5/5 Hip abductors: 4/5   I did review the MRI: There is mild degenerative disc disease with disc protrusion at the L5-S1 level it does not appear to cause any foraminal stenosis. On her pelvic MRI, the SI joints appear unremarkable, there is marked edema in the iliopsoas musculature on the right side with high T2 signal, there is also increased T2 signal in the hip abductor muscles. There are no discrete collections.   Impression and Recommendations:   This case required medical decision making of moderate complexity.

## 2012-06-09 ENCOUNTER — Other Ambulatory Visit: Payer: Self-pay | Admitting: Sports Medicine

## 2012-06-09 LAB — VITAMIN D 25 HYDROXY (VIT D DEFICIENCY, FRACTURES): Vit D, 25-Hydroxy: 39 ng/mL (ref 30–89)

## 2012-06-10 ENCOUNTER — Telehealth: Payer: Self-pay

## 2012-06-10 MED ORDER — PREDNISONE (PAK) 10 MG PO TABS: ORAL_TABLET | ORAL | Status: DC

## 2012-06-10 MED ORDER — TRAMADOL HCL 50 MG PO TABS: 50.0000 mg | ORAL_TABLET | Freq: Three times a day (TID) | ORAL | Status: DC | PRN

## 2012-06-10 NOTE — Telephone Encounter (Signed)
I can refill both. I'm going to place her on a longer prednisone taper, I will also add tramadol.

## 2012-06-10 NOTE — Telephone Encounter (Signed)
Dr Benjamin StainDorene Grebe states the prednisone is helping and she would like a refill.  Dr Linford ArnoldDorene Grebe wants a refill on Tramadol and you have never prescribed this for her. Is the refill appropriate?

## 2012-06-30 ENCOUNTER — Ambulatory Visit (INDEPENDENT_AMBULATORY_CARE_PROVIDER_SITE_OTHER): Payer: 59 | Admitting: Sports Medicine

## 2012-06-30 DIAGNOSIS — M25551 Pain in right hip: Secondary | ICD-10-CM

## 2012-06-30 DIAGNOSIS — M25559 Pain in unspecified hip: Secondary | ICD-10-CM

## 2012-06-30 MED ORDER — PREDNISONE 10 MG PO TABS: ORAL_TABLET | ORAL | Status: DC

## 2012-06-30 NOTE — Assessment & Plan Note (Signed)
Iliopsoas pain has resolved. No SI joint tenderness with a negative Patrick's test. I think her symptoms are predominantly related to the gluteus maximus with pain with resisted hip extension. Intra-articular testing is negative. Going to go for another course of prednisone as it worked so well last time for the iliopsoas myositis.  I would also like her doing a specific set of hip extension exercises. I will see her back in one month.

## 2012-06-30 NOTE — Progress Notes (Signed)
  Subjective:    CC: Followup  HPI: Right hip pain: I saw Ezabella approximately 3 weeks ago after her intense experience in the hospital. 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 Pain was significantly improved while on the prednisone and she was able to walk and function on her own. Unfortunately some of the pain has returned after coming off. The pain is different now is not as apparent with hip flexion. She now localizes the pain deep into the right gluteal region posteriorly. It's worse when ambulating, and worse with trying to get up from a seated position. He continues to deny constitutional symptoms. She also continues to improve.   Past medical history, Surgical history, Family history not pertinant except as noted below, Social history, Allergies, and medications have been entered into the medical record, reviewed, and no changes needed.   Review of Systems: No headache, visual changes, nausea, vomiting, diarrhea, constipation, dizziness, abdominal pain, skin rash, fevers, chills, night sweats, weight loss, swollen lymph nodes, body aches, joint swelling, muscle aches, chest pain, shortness of breath, mood changes, visual or auditory hallucinations.   Objective:   General: Well Developed, well nourished, and in no acute distress.  Neuro/Psych: Alert and oriented x3, extra-ocular muscles intact, able to move all 4 extremities, sensation grossly intact. Skin: Warm and dry, no rashes noted.  Respiratory: Not using accessory muscles, speaking in full sentences, trachea midline.  Cardiovascular: Pulses palpable, no extremity edema. Abdomen: Does not appear distended. Right Hip: ROM IR: 45 Deg, ER: 45 Deg, Flexion: 120 Deg, Extension: 100 Deg, Abduction: 45 Deg, Adduction: 45 Deg Strength IR: 5/5, ER: 5/5, Flexion: 5/5, Extension: 4/5, Abduction: 4/5, Adduction: 5/5 there is reproduction of pain with resisted hip extension as well as mild  reproduction with resisted abduction. Pelvic alignment unremarkable to inspection and palpation. Standing hip rotation and gait without trendelenburg sign / unsteadiness. Greater trochanter without tenderness to palpation. No tenderness over piriformis and greater trochanter. There is only minimal tenderness to palpation over the ischial tuberosity. No pain with FABER or FADIR. No SI joint tenderness and normal minimal SI movement. Impression and Recommendations:   This case required medical decision making of moderate complexity.

## 2012-07-03 ENCOUNTER — Telehealth: Payer: Self-pay | Admitting: *Deleted

## 2012-07-03 NOTE — Telephone Encounter (Signed)
Pt called to inform that her employer will be sending a fax in regards to her Short term disability and just wanted [place] to be aware.Loralee [place]as [place]

## 2012-07-06 ENCOUNTER — Encounter: Payer: Self-pay | Admitting: Sports Medicine

## 2012-07-06 DIAGNOSIS — Z0289 Encounter for other administrative examinations: Secondary | ICD-10-CM

## 2012-07-06 NOTE — Progress Notes (Signed)
Disability forms filled out. Charge sheet completed.

## 2012-07-29 ENCOUNTER — Ambulatory Visit (INDEPENDENT_AMBULATORY_CARE_PROVIDER_SITE_OTHER): Payer: 59 | Admitting: Sports Medicine

## 2012-07-29 VITALS — BP 159/106 | HR 119 | Wt 288.0 lb

## 2012-07-29 DIAGNOSIS — M25559 Pain in unspecified hip: Secondary | ICD-10-CM

## 2012-07-29 DIAGNOSIS — M25551 Pain in right hip: Secondary | ICD-10-CM

## 2012-07-29 NOTE — Progress Notes (Signed)
   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 Since her discharge I placed her on prednisone a couple of times and given her very specific rehabilitation exercises, overall she reports that she is much better and continues to improve on a daily basis. Her pain at the last visit was predominantly gluteus maximus with reproduction with resisted hip extension. Today is predominantly in the upper biceps femoris. She's been working very hard with her rehabilitation exercises. She notes a little pain in her low back pain continues to be worse when getting up and out of the chair. She is ready to go back to work and would like a note. Pain is moderate and improving. Baby is doing very well.  Past medical history, Surgical history, Family history not pertinant except as noted below, Social history, Allergies, and medications have been entered into the medical record, reviewed, and no changes needed.   Review of Systems: No headache, visual changes, nausea, vomiting, diarrhea, constipation, dizziness, abdominal pain, skin rash, fevers, chills, night sweats, weight loss, swollen lymph nodes, body aches, joint swelling, muscle aches, chest pain, shortness of breath, mood changes, visual or auditory hallucinations.   Objective:   General: Well Developed, well nourished, and in no acute distress.  Neuro/Psych: Alert and oriented x3, extra-ocular muscles intact, able to move all 4 extremities, sensation grossly intact. Skin: Warm and dry, no rashes noted.  Respiratory: Not using accessory muscles, speaking in full sentences, trachea midline.  Cardiovascular: Pulses palpable, no extremity edema. Abdomen: Does not appear distended. Right Hip: ROM IR: 45 Deg, ER: 45 Deg, Flexion: 120 Deg, Extension: 100 Deg, Abduction: 45 Deg, Adduction:  45 Deg Strength IR: 5/5, ER: 5/5, Flexion: 5/5, Extension: 5/5, Abduction: 5/5, Adduction: 5/5 Pelvic alignment unremarkable to inspection and palpation. Standing hip rotation and gait without trendelenburg sign / unsteadiness. Greater trochanter without tenderness to palpation. No tenderness over piriformis and greater trochanter. No pain with FABER or FADIR. No SI joint tenderness and normal minimal SI movement. Reproduction of pain with resisted knee flexion in the prone position, pain is reproduced towards the upper biceps femoris, and is reproducible with palpation.  I reviewed her MRI again, there is mild L5-S1 degenerative disc disease without overt foraminal stenosis, is there only mild on the left side.  Impression and Recommendations:   This case required medical decision making of moderate complexity.

## 2012-07-29 NOTE — Patient Instructions (Addendum)
Initial Hamstring Rehab Protocol Hamstring curls: Start with 3 sets of 15 (no weight); Progress by 5 reps every 3 days until you reach 3 sets of 30; After 3 days at 3 sets of 30, add 2lb ankle weight at 3 sets of 10; Increase every 5 days by 5 reps. You may add 2lbs ankle weight once weekly. Hamstring swings- swing leg backwards and curl at the end of the swing. Follow same schedule as above. Hamstring running lunges- running lunge position means no more than 45 degrees of knee flexion and running motion. Follow same schedule as above. 

## 2012-07-29 NOTE — Assessment & Plan Note (Signed)
Pain continues to improve on a day by day basis though it is somewhat slow. Today pain is predominantly upper biceps femoris. I am going to change her home exercises to focus predominantly on the centric hamstring exercises. Continue oral analgesics and NSAIDs as before. I would like to see her back in 6 weeks.  There is some mild L5-S1 degenerative disc disease on MRI, she did have a little bit of back pain, we can certainly approach this at a later date I certainly would like to deal with the issues above first.

## 2012-08-12 ENCOUNTER — Emergency Department (INDEPENDENT_AMBULATORY_CARE_PROVIDER_SITE_OTHER)
Admission: EM | Admit: 2012-08-12 | Discharge: 2012-08-12 | Disposition: A | Discharge status: Home / Self Care | Payer: 59 | Source: Home / Self Care | Attending: Family Medicine | Admitting: Family Medicine

## 2012-08-12 ENCOUNTER — Encounter: Payer: Self-pay | Admitting: *Deleted

## 2012-08-12 ENCOUNTER — Telehealth: Payer: Self-pay | Admitting: *Deleted

## 2012-08-12 DIAGNOSIS — R600 Localized edema: Secondary | ICD-10-CM

## 2012-08-12 DIAGNOSIS — R609 Edema, unspecified: Secondary | ICD-10-CM

## 2012-08-12 NOTE — ED Notes (Signed)
Pt c/o swelling of her feet bilaterally x 10 days, after returning to work from maternity leave. Denies pain.

## 2012-08-12 NOTE — Telephone Encounter (Signed)
Pt has called requesting another Prednisone pack to be called in to her pharmacy. I informed her she would need to come in for an appointment before it could be called in. She got upset and stated she wanted you to call her when you got back on Friday. The reason I informed her she needed to come in to be seen is because she has already been on 2 taper packs of Prednisone, one on 06/10/12 and one on 06/30/12. I also informed her you had appointments next week or your coworkers could see her but she is insisting she doesn't need to come in since you have called them in before. Please advise.

## 2012-08-12 NOTE — Telephone Encounter (Signed)
Yes shes had a lot of oral steroids, I would certainly rather see her to come up with a better plan that may involve an injection, of course if she is cont to improve she should do her best to be patient and I could add a short course of a pain medication to take the edge off.

## 2012-08-13 ENCOUNTER — Other Ambulatory Visit (HOSPITAL_BASED_OUTPATIENT_CLINIC_OR_DEPARTMENT_OTHER): Payer: 59

## 2012-08-13 NOTE — Telephone Encounter (Signed)
LM on VM for pt to return call. 

## 2012-08-13 NOTE — Telephone Encounter (Signed)
Pt states she will check her schedule and call back to make appt.

## 2012-08-14 ENCOUNTER — Telehealth: Payer: Self-pay | Admitting: *Deleted

## 2012-08-14 ENCOUNTER — Ambulatory Visit (HOSPITAL_BASED_OUTPATIENT_CLINIC_OR_DEPARTMENT_OTHER)
Admission: RE | Admit: 2012-08-14 | Discharge: 2012-08-14 | Disposition: A | Discharge status: Home / Self Care | Payer: 59 | Source: Ambulatory Visit | Attending: Family Medicine | Admitting: Family Medicine

## 2012-08-14 ENCOUNTER — Other Ambulatory Visit: Payer: Self-pay | Admitting: Sports Medicine

## 2012-08-14 DIAGNOSIS — R609 Edema, unspecified: Secondary | ICD-10-CM | POA: Insufficient documentation

## 2012-08-14 NOTE — Telephone Encounter (Signed)
Pt states that 4/28 is the earliest she can come in and that she will take what she has for now. States if she runs out before appt she will call back and ask for refill.

## 2012-08-14 NOTE — Telephone Encounter (Signed)
Since she is still having pain, and we have really not looked into her lumbar spine, I would really need to see her, go over her symptoms again, as we had discussed interventional injections.

## 2012-08-14 NOTE — Telephone Encounter (Signed)
Pt has called asking what would be the pain medication you would prescribe her. She states she has scheduled an appt for 08/31/12 due to her work schedule. She is asking so she can make sure it is something she can take while working. You do have her on tramadol already. Please advise.

## 2012-08-27 ENCOUNTER — Other Ambulatory Visit: Payer: Self-pay | Admitting: Sports Medicine

## 2012-08-31 ENCOUNTER — Ambulatory Visit: Payer: 59 | Admitting: Sports Medicine

## 2012-09-01 ENCOUNTER — Encounter: Payer: Self-pay | Admitting: Sports Medicine

## 2012-09-01 ENCOUNTER — Ambulatory Visit (INDEPENDENT_AMBULATORY_CARE_PROVIDER_SITE_OTHER): Payer: 59 | Admitting: Sports Medicine

## 2012-09-01 VITALS — BP 153/92 | HR 100 | Resp 16 | Wt 296.0 lb

## 2012-09-01 DIAGNOSIS — M25559 Pain in unspecified hip: Secondary | ICD-10-CM

## 2012-09-01 DIAGNOSIS — M25551 Pain in right hip: Secondary | ICD-10-CM

## 2012-09-01 MED ORDER — FUROSEMIDE 40 MG PO TABS: 40.0000 mg | ORAL_TABLET | Freq: Every day | ORAL | Status: DC

## 2012-09-01 NOTE — Progress Notes (Signed)
  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 Since her discharge I placed her on prednisone a couple of times and given her very specific rehabilitation exercises, she reports today that her pain is resolved, she is doing well at work, and her baby is doing well.  Past medical history, Surgical history, Family history not pertinant except as noted below, Social history, Allergies, and medications have been entered into the medical record, reviewed, and no changes needed.   Review of Systems: No fevers, chills, night sweats, weight loss, chest pain, or shortness of breath.   Objective:    General: Well Developed, well nourished, and in no acute distress.  Neuro: Alert and oriented x3, extra-ocular muscles intact, sensation grossly intact.  HEENT: Normocephalic, atraumatic, pupils equal round reactive to light, neck supple, no masses, no lymphadenopathy, thyroid nonpalpable.  Skin: Warm and dry, no rashes. Cardiac: Regular rate and rhythm, no murmurs rubs or gallops, no lower extremity edema.  Respiratory: Clear to auscultation bilaterally. Not using accessory muscles, speaking in full sentences. Impression and Recommendations:

## 2012-09-01 NOTE — Assessment & Plan Note (Signed)
Pain has now resolved. Continue home  rehabilitation. She does have some swelling in both lower extremities, and recently had negative lower extremity venous Dopplers.  I do suspect this is likely related to her multiple courses of steroids. I'm going to add Lasix for 5 days, and then she'll follow up with her PCP regarding the swelling . She can come back to see me on an as-needed basis

## 2012-09-08 ENCOUNTER — Ambulatory Visit (INDEPENDENT_AMBULATORY_CARE_PROVIDER_SITE_OTHER): Payer: 59 | Admitting: Family Medicine

## 2012-09-08 ENCOUNTER — Encounter: Payer: Self-pay | Admitting: Family Medicine

## 2012-09-08 VITALS — BP 128/80 | HR 118 | Ht 67.0 in | Wt 295.0 lb

## 2012-09-08 DIAGNOSIS — M7989 Other specified soft tissue disorders: Secondary | ICD-10-CM

## 2012-09-08 DIAGNOSIS — R5381 Other malaise: Secondary | ICD-10-CM

## 2012-09-08 DIAGNOSIS — R635 Abnormal weight gain: Secondary | ICD-10-CM

## 2012-09-08 LAB — COMPLETE METABOLIC PANEL WITH GFR
ALT: 25 U/L (ref 0–35)
Albumin: 3.7 g/dL (ref 3.5–5.2)
CO2: 25 mEq/L (ref 19–32)
Calcium: 9.1 mg/dL (ref 8.4–10.5)
Chloride: 103 mEq/L (ref 96–112)
GFR, Est African American: >89 mL/min
Potassium: 4.1 mEq/L (ref 3.5–5.3)
Sodium: 137 mEq/L (ref 135–145)
Total Protein: 6.6 g/dL (ref 6.0–8.3)

## 2012-09-08 LAB — CBC WITH DIFFERENTIAL/PLATELET
Eosinophils Absolute: 0.1 10*3/uL (ref 0.0–0.7)
Hemoglobin: 12.8 g/dL (ref 12.0–15.0)
Lymphocytes Relative: 34 % (ref 12–46)
Lymphs Abs: 2.5 10*3/uL (ref 0.7–4.0)
MCH: 26 pg (ref 26.0–34.0)
Monocytes Relative: 5 % (ref 3–12)
Neutro Abs: 4.3 10*3/uL (ref 1.7–7.7)
Neutrophils Relative %: 60 % (ref 43–77)
RBC: 4.92 MIL/uL (ref 3.87–5.11)

## 2012-09-08 LAB — TSH: TSH: 1.625 u[IU]/mL (ref 0.350–4.500)

## 2012-09-08 MED ORDER — HYDROCHLOROTHIAZIDE 25 MG PO TABS: 25.0000 mg | ORAL_TABLET | Freq: Every day | ORAL | Status: DC

## 2012-09-08 NOTE — Progress Notes (Signed)
  Subjective:    Patient ID: Tammy Roman, female    DOB: 17-Jan-1981, 32 y.o.   MRN: [id number]  HPI Here to discuss lower extremity edema. Both ankles and feet swell. Stays chronically swollen.  No problems before having the baby.  BP is well controlled.  Lasix did help. No worsening or alleviating sxs.  She drinks plenty of fluid. Says feet are still swollen when wakes up. Hair has been thinning. She has been exercising and really working on eating healthy. Lots of veggies, lean meat, fibrer andvery little sweet foods.  No skin changes.  More fatigue than usual.    Review of Systems No CP or SOB.     Objective:   Physical Exam  Constitutional: She is oriented to person, place, and time. She appears well-developed and well-nourished.  HENT:  Head: Normocephalic and atraumatic.  Neck: Neck supple. No thyromegaly present.  Cardiovascular: Normal rate, regular rhythm and normal heart sounds.   Pulmonary/Chest: Effort normal and breath sounds normal.  Musculoskeletal: She exhibits edema.  1+ pitting edema to the knees, worse at the feet. Bilat  Lymphadenopathy:    She has no cervical adenopathy.  Neurological: She is alert and oriented to person, place, and time.  Skin: Skin is warm and dry.  Psychiatric: She has a normal mood and affect. Her behavior is normal.          Assessment & Plan:  LE edema - Will check CMP, TSH, CBC.  Discussed limiting salt to 1500mg  per day and limiting fluids to 70-80 oz. Currently drinking 90 oz a day.    Hair loss  - Will check TSH.  Will check for iron and b12 deficiency.   Obesity - continue to work on diet and exercise Will check TSH.

## 2012-09-09 ENCOUNTER — Ambulatory Visit: Payer: 59 | Admitting: Sports Medicine

## 2012-09-16 NOTE — ED Provider Notes (Signed)
History     CSN: [id number]  Arrival date & time 08/12/12  9562   First MD Initiated Contact with Patient 08/12/12 1841      Chief Complaint  Patient presents with  . Foot Swelling   HPI  Bilateral foot swelling x 1-2 weeks Pt has had swelling in lower extremities, usually after prolonged periods of standing or working.  Pt denies any recent increased salt intake or NSAID use.  Pt recently returned to work s/p maternity leave.  No popliteal tenderness.  No CP, SOB.  NO prior hx/o PE.  No prior hx/o heart failure or HTN.  No orthopnea or PND.  Denies any prior history of this in the past.  Pt does report completing steroid taper for hip pain.   Past Medical History  Diagnosis Date  . Obesity   . HSV-2 (herpes simplex virus 2) infection   . No pertinent past medical history     History reviewed. No pertinent past surgical history.  Family History  Problem Relation Age of Onset  . Hyperlipidemia Mother   . Hypertension Mother   . Hyperlipidemia Father   . Hypertension Father   . Prostate cancer Father   . Cancer Father     prostate  . Breast cancer Paternal Aunt     metastasized     History  Substance Use Topics  . Smoking status: Never Smoker   . Smokeless tobacco: Never Used  . Alcohol Use: No     Comment: occasional    OB History   Grav Para Term Preterm Abortions TAB SAB Ect Mult Living   2 2 2       2       Review of Systems  All other systems reviewed and are negative.    Allergies  Review of patient's allergies indicates no known allergies.  Home Medications   Current Outpatient Rx  Name  Route  Sig  Dispense  Refill  . hydrochlorothiazide (HYDRODIURIL) 25 MG tablet   Oral   Take 1 tablet (25 mg total) by mouth daily.   30 tablet   0   . ibuprofen (ADVIL,MOTRIN) 800 MG tablet   Oral   Take 800 mg by mouth every 8 (eight) hours as needed. For pain.         . traMADol (ULTRAM) 50 MG tablet      TAKE 1 TABLET EVERY 8 HOURS AS  NEEDED MODERATE TO SEVERE PAIN   60 tablet   0   . valACYclovir (VALTREX) 500 MG tablet   Oral   Take 1 tablet (500 mg total) by mouth daily. This is not a new medication for patient. She has these and has been taking.           BP 136/79  Pulse 120  Temp(Src) 98.3 F (36.8 C) (Oral)  Ht 5\' 7"  (1.702 m)  Wt 297 lb (134.718 kg)  BMI 46.51 kg/m2  SpO2 98%  LMP 07/14/2012  Physical Exam  Constitutional:  Morbidly obese   HENT:  Head: Normocephalic and atraumatic.  Right Ear: External ear normal.  Eyes: Conjunctivae are normal. Pupils are equal, round, and reactive to light.  Neck: Normal range of motion. Neck supple.  Cardiovascular: Normal rate, regular rhythm and normal heart sounds.   Pulmonary/Chest: Effort normal.  Abdominal: Soft.  Musculoskeletal: Normal range of motion.  1-2+ pitting edema in LEs bilaterally No popliteal tenderness   Neurological: She is alert.  Skin: Skin is warm.    ED  Course  Procedures (including critical care time)  Labs Reviewed - No data to display No results found.   1. Bilateral lower extremity edema       MDM  Suspect this is likely secondary to baseline obesity and recent steroid use. Discussed low salt diet. Given the patient is a within the postpartum window, will obtain a lotion the venous duplex to rule out DVT a source of symptoms. Discussed patient general care and vascular red flags. Followup as needed.      The patient and/or caregiver has been counseled thoroughly with regard to treatment plan and/or medications prescribed including dosage, schedule, interactions, rationale for use, and possible side effects and they verbalize understanding. Diagnoses and expected course of recovery discussed and will return if not improved as expected or if the condition worsens. Patient and/or caregiver verbalized understanding.              Doree Albee, MD 09/16/12 [phone]

## 2012-09-23 ENCOUNTER — Ambulatory Visit: Payer: 59 | Admitting: Family Medicine

## 2012-09-24 ENCOUNTER — Other Ambulatory Visit: Payer: Self-pay | Admitting: Family Medicine

## 2012-09-24 ENCOUNTER — Encounter: Payer: Self-pay | Admitting: Family Medicine

## 2012-09-24 ENCOUNTER — Ambulatory Visit (INDEPENDENT_AMBULATORY_CARE_PROVIDER_SITE_OTHER): Payer: 59 | Admitting: Family Medicine

## 2012-09-24 VITALS — BP 106/84 | HR 88 | Ht 67.0 in | Wt 294.0 lb

## 2012-09-24 DIAGNOSIS — R7989 Other specified abnormal findings of blood chemistry: Secondary | ICD-10-CM

## 2012-09-24 DIAGNOSIS — R7301 Impaired fasting glucose: Secondary | ICD-10-CM

## 2012-09-24 DIAGNOSIS — R609 Edema, unspecified: Secondary | ICD-10-CM

## 2012-09-24 DIAGNOSIS — E119 Type 2 diabetes mellitus without complications: Secondary | ICD-10-CM

## 2012-09-24 DIAGNOSIS — R600 Localized edema: Secondary | ICD-10-CM

## 2012-09-24 MED ORDER — AMBULATORY NON FORMULARY MEDICATION: Status: DC

## 2012-09-24 MED ORDER — METFORMIN HCL 500 MG PO TABS: 500.0000 mg | ORAL_TABLET | Freq: Two times a day (BID) | ORAL | Status: DC

## 2012-09-24 NOTE — Progress Notes (Signed)
  Subjective:    Patient ID: Tammy Roman, female    DOB: 06-Dec-1980, 32 y.o.   MRN: [id number]  HPI Still having some LE. Fluctuates. Says better when does more. Says responding well to the Hctz.  Wakes up with it swollen. Usually takes pill the Am. WAtching her salt intake.    Elevated glucose - will test for Diabetes.  + fam hx of DM. No inc thirst ir urination.   Review of Systems     Objective:   Physical Exam  Constitutional: She is oriented to person, place, and time. She appears well-developed and well-nourished.  HENT:  Head: Normocephalic and atraumatic.  Cardiovascular: Normal rate, regular rhythm and normal heart sounds.   Pulmonary/Chest: Effort normal and breath sounds normal.  Musculoskeletal: She exhibits edema.  1+ LE edema around the feet and ankles. Trace along the tibia to mid tibia.   Neurological: She is alert and oriented to person, place, and time.  Skin: Skin is warm and dry.  Psychiatric: She has a normal mood and affect. Her behavior is normal.          Assessment & Plan:  LE edema - much improved and better controlled on low-dose hydrochlorothiazide. She's tolerating it well without any side effects. I strongly feel that if she's able to lose a significant amount of weight that her edema will likely improve.  Recheck alk phos- we'll recheck her level today.  DM - Controlled. New DX. discussed her new diagnosis. We also discussed cutting back on concentrated sweets as well as watching portion sizes on carbohydrates. Offered to refer her to nutrition counseling if she would like. She wants to make some changes on her own first and see how she does. We also discussed starting a medication such as metformin. We discussed the potential benefits as well as potential side effects of the medication. Prescription sent her pharmacy. I strongly encouraged her to work on weight loss as well. She's about 40-50 pounds overweight and having her baby. I really  feel like if she can get this off and we can completely reverse her diabetes. Followup in 3 months. Given prescription for glucometer, lancets, strips. Encouraged her to check her sugars fasting in the morning 2-3 times per week and bring those numbers in with her when she returns. Lab Results  Component Value Date   HGBA1C 6.8 09/24/2012   Time spent 25 min, >50% spent in counseling about her new dx of Diabetes.

## 2012-09-29 LAB — ALKALINE PHOSPHATASE: Alkaline Phosphatase: 120 U/L — ABNORMAL HIGH (ref 39–117)

## 2012-09-29 NOTE — Addendum Note (Signed)
Addended by: Nani Gasser D on: 09/29/2012 09:02 PM   Modules accepted: Level of Service

## 2012-09-30 LAB — GAMMA GT: GGT: 40 U/L (ref 7–51)

## 2012-10-20 ENCOUNTER — Other Ambulatory Visit: Payer: Self-pay | Admitting: *Deleted

## 2012-10-20 MED ORDER — HYDROCHLOROTHIAZIDE 25 MG PO TABS: 25.0000 mg | ORAL_TABLET | Freq: Every day | ORAL | Status: DC

## 2012-12-02 ENCOUNTER — Other Ambulatory Visit: Payer: Self-pay | Admitting: *Deleted

## 2012-12-02 MED ORDER — VALACYCLOVIR HCL 500 MG PO TABS: 500.0000 mg | ORAL_TABLET | Freq: Every day | ORAL | Status: DC

## 2012-12-11 ENCOUNTER — Other Ambulatory Visit: Payer: Self-pay | Admitting: *Deleted

## 2012-12-11 MED ORDER — METFORMIN HCL 500 MG PO TABS: 500.0000 mg | ORAL_TABLET | Freq: Two times a day (BID) | ORAL | Status: DC

## 2012-12-22 ENCOUNTER — Ambulatory Visit (INDEPENDENT_AMBULATORY_CARE_PROVIDER_SITE_OTHER): Payer: 59 | Admitting: Family Medicine

## 2012-12-22 ENCOUNTER — Encounter: Payer: Self-pay | Admitting: Family Medicine

## 2012-12-22 VITALS — BP 128/82 | HR 100 | Ht 67.0 in | Wt 289.0 lb

## 2012-12-22 DIAGNOSIS — E669 Obesity, unspecified: Secondary | ICD-10-CM

## 2012-12-22 DIAGNOSIS — R7301 Impaired fasting glucose: Secondary | ICD-10-CM

## 2012-12-22 DIAGNOSIS — R030 Elevated blood-pressure reading, without diagnosis of hypertension: Secondary | ICD-10-CM

## 2012-12-22 DIAGNOSIS — E119 Type 2 diabetes mellitus without complications: Secondary | ICD-10-CM

## 2012-12-22 LAB — POCT GLYCOSYLATED HEMOGLOBIN (HGB A1C): Hemoglobin A1C: 6.3

## 2012-12-22 NOTE — Progress Notes (Signed)
  Subjective:    Patient ID: Tammy Roman, female    DOB: 1981/02/02, 32 y.o.   MRN: [id number]  HPI DM- started the metofmrin and sugars were going low so she decreased to once a day and then still going low so finally stopped it.   Has been eating more healthy and has been working out. She has been taking IBU.  Has lost 5 lbs.   Elevated blood pressure-she has been taking some ibuprofen for pain relief. No CP or SOb.   Review of Systems     Objective:   Physical Exam  Constitutional: She is oriented to person, place, and time. She appears well-developed and well-nourished.  HENT:  Head: Normocephalic and atraumatic.  Cardiovascular: Normal rate, regular rhythm and normal heart sounds.   Pulmonary/Chest: Effort normal and breath sounds normal.  Neurological: She is alert and oriented to person, place, and time.  Skin: Skin is warm and dry.  Psychiatric: She has a normal mood and affect. Her behavior is normal.          Assessment & Plan:  DM- she started having lows on the metformin and so stopped it. Hopefully her A1c looks fantastic today. She's lost 5 pounds which is fantastic.  Obesity-has lost 5 pounds. Keep up the good work.  Elevated blood pressure-we will recheck at the end of the visit today. It could be an elevation from taking the ibuprofen this morning.  Repeat BP was normal.

## 2012-12-25 ENCOUNTER — Ambulatory Visit: Payer: 59 | Admitting: Family Medicine

## 2013-01-12 ENCOUNTER — Other Ambulatory Visit: Payer: Self-pay | Admitting: Family Medicine

## 2013-01-29 ENCOUNTER — Encounter: Payer: Self-pay | Admitting: Emergency Medicine

## 2013-01-29 ENCOUNTER — Emergency Department (INDEPENDENT_AMBULATORY_CARE_PROVIDER_SITE_OTHER)
Admission: EM | Admit: 2013-01-29 | Discharge: 2013-01-29 | Disposition: A | Discharge status: Home / Self Care | Payer: 59 | Source: Home / Self Care | Attending: Family Medicine | Admitting: Family Medicine

## 2013-01-29 DIAGNOSIS — J069 Acute upper respiratory infection, unspecified: Secondary | ICD-10-CM

## 2013-01-29 DIAGNOSIS — B309 Viral conjunctivitis, unspecified: Secondary | ICD-10-CM

## 2013-01-29 MED ORDER — POLYMYXIN B-TRIMETHOPRIM 10000-0.1 UNIT/ML-% OP SOLN: 1.0000 [drp] | OPHTHALMIC | Status: DC

## 2013-01-29 NOTE — ED Notes (Signed)
Woke up this morning with eye redness and pain

## 2013-01-29 NOTE — ED Provider Notes (Signed)
CSN: [id number]     Arrival date & time 01/29/13  1558 History   None    Chief Complaint  Patient presents with  . Eye Pain     HPI Comments: Patient awoke this morning with mild right eye redness and discharge, but no pain.  She denies foreign body sensation.  No change in vision.  She admits that she normally sleeps with her contact lens in place. She states that she has had cold symptoms for one week, initially with sore throat and sinus congestion.  She now has developed mild right earache.  No cough presently.  The history is provided by the patient.    Past Medical History  Diagnosis Date  . Obesity   . HSV-2 (herpes simplex virus 2) infection   . No pertinent past medical history    History reviewed. No pertinent past surgical history. Family History  Problem Relation Age of Onset  . Hyperlipidemia Mother   . Hypertension Mother   . Hyperlipidemia Father   . Hypertension Father   . Prostate cancer Father   . Cancer Father     prostate  . Breast cancer Paternal Aunt     metastasized    History  Substance Use Topics  . Smoking status: Never Smoker   . Smokeless tobacco: Never Used  . Alcohol Use: No     Comment: occasional   OB History   Grav Para Term Preterm Abortions TAB SAB Ect Mult Living   2 2 2       2      Review of Systems + sore  throat + hoarse No cough No pleuritic pain No wheezing + nasal congestion + post-nasal drainage No sinus pain/pressure + itchy/red eye ? earache No hemoptysis No SOB No fever/chills No nausea No vomiting No abdominal pain No diarrhea No urinary symptoms No skin rashes + fatigue No myalgias + headache Used OTC meds without relief    Allergies  Review of patient's allergies indicates not on file.  Home Medications   Current Outpatient Rx  Name  Route  Sig  Dispense  Refill  . AMBULATORY NON FORMULARY MEDICATION      Medication Name:Glucometer, lancets and strips to test once daily. 90 days supplyt   1 Units   PRN   . hydrochlorothiazide (HYDRODIURIL) 25 MG tablet   Oral   Take 1 tablet (25 mg total) by mouth daily.   30 tablet   0   . ibuprofen (ADVIL,MOTRIN) 800 MG tablet   Oral   Take 800 mg by mouth every 8 (eight) hours as needed. For pain.         . metFORMIN (GLUCOPHAGE) 500 MG tablet   Oral   Take 1 tablet (500 mg total) by mouth 2 (two) times daily with a meal.   180 tablet   0   . traMADol (ULTRAM) 50 MG tablet      TAKE 1 TABLET EVERY 8 HOURS AS NEEDED MODERATE TO SEVERE PAIN   60 tablet   0   . trimethoprim-polymyxin b (POLYTRIM) ophthalmic solution   Left Eye   Place 1 drop into the left eye every 4 (four) hours. (Rx void after 02/06/13)   10 mL   0   . valACYclovir (VALTREX) 500 MG tablet      TAKE 1 TABLET EVERY DAY   30 tablet   0    BP 117/78  Pulse 105  Temp(Src) 98.5 F (36.9 C) (Oral)  Ht 5\' 7"  (1.702 m)  Wt 283 lb (128.368 kg)  BMI 44.31 kg/m2  SpO2 100% Physical Exam Nursing notes and Vital Signs reviewed. Appearance:  Patient appears stated age, and in no acute distress.   Patient is obese (BMI 44.3)   Eyes:  Pupils are equal, round, and reactive to light and accomodation.  Extraocular movement is intact.  Right conjunctivae minimally injected without discharge.  No lid swelling or tenderness.  No  photophobia Ears:  Canals normal.  Tympanic membranes normal.  Nose:  Mildly congested turbinates.  No sinus tenderness.   Pharynx:  Normal Neck:  Supple.  Non-tender  shotty anterior/posterior nodes are palpated bilaterally  Lungs:  Clear to auscultation.  Breath sounds are equal.  Heart:  Regular rate and rhythm without murmurs, rubs, or gallops.  Skin:  No rash present.   ED Course  Procedures  none     MDM   1. Conjunctivitis, viral   2. Acute upper respiratory infections of unspecified site    Treat symptomatically for now: If eye becomes increasingly irritated after two days, begin antibiotic eye drops (Given a prescription for Polytrim to hold, with an expiration date)  Avoid using contact lens until eye redness resolved. May take Mucinex D (guaifenesin with decongestant) twice daily for congestion.  Increase fluid intake, rest. May use Afrin nasal spray (or generic oxymetazoline) twice daily for about 5 days.  Also recommend using saline nasal spray several times daily and saline nasal irrigation (AYR is a common brand) Stop all antihistamines for now, and other non-prescription cough/cold preparations. May take Delsym Cough Suppressant at bedtime for nighttime cough.    Follow-up with family doctor if not improving about one week.Marland Kitchen     Lattie Haw, MD 01/29/13 [phone]

## 2013-03-09 ENCOUNTER — Other Ambulatory Visit: Payer: Self-pay | Admitting: Family Medicine

## 2013-04-09 ENCOUNTER — Encounter: Payer: Self-pay | Admitting: Sports Medicine

## 2013-04-09 ENCOUNTER — Ambulatory Visit (INDEPENDENT_AMBULATORY_CARE_PROVIDER_SITE_OTHER): Payer: 59 | Admitting: Sports Medicine

## 2013-04-09 ENCOUNTER — Ambulatory Visit (INDEPENDENT_AMBULATORY_CARE_PROVIDER_SITE_OTHER): Payer: 59 | Admitting: Family Medicine

## 2013-04-09 ENCOUNTER — Encounter: Payer: Self-pay | Admitting: Family Medicine

## 2013-04-09 VITALS — BP 128/78 | HR 78 | Temp 97.6°F | Ht 67.0 in | Wt 282.0 lb

## 2013-04-09 VITALS — BP 128/78 | HR 78 | Wt 282.0 lb

## 2013-04-09 DIAGNOSIS — R635 Abnormal weight gain: Secondary | ICD-10-CM

## 2013-04-09 DIAGNOSIS — N760 Acute vaginitis: Secondary | ICD-10-CM

## 2013-04-09 DIAGNOSIS — M549 Dorsalgia, unspecified: Secondary | ICD-10-CM

## 2013-04-09 DIAGNOSIS — Z6841 Body Mass Index (BMI) 40.0 and over, adult: Secondary | ICD-10-CM

## 2013-04-09 DIAGNOSIS — M25551 Pain in right hip: Secondary | ICD-10-CM

## 2013-04-09 LAB — WET PREP FOR TRICH, YEAST, CLUE
Clue Cells Wet Prep HPF POC: NONE SEEN
Trich, Wet Prep: NONE SEEN

## 2013-04-09 MED ORDER — PHENTERMINE HCL 37.5 MG PO CAPS: 37.5000 mg | ORAL_CAPSULE | ORAL | Status: DC

## 2013-04-09 MED ORDER — FLUCONAZOLE 150 MG PO TABS: 150.0000 mg | ORAL_TABLET | Freq: Once | ORAL | Status: DC

## 2013-04-09 NOTE — Progress Notes (Signed)
   Subjective:    Patient ID: Tammy Roman, female    DOB: [birth year], 32 y.o.   MRN: [id number]  HPI pt had a tooth extraction few wks ago and was given ABX . she has tried OTC meds didn't help. She feels she has a yeast infection. No pelvic pain, INcrase in discharge. No itching or irritation.   Obesity - She has been exercising daily during lunch with her co-workers. Has lower her carb intake and increased veggie change. She has used a couple Engineer, water on and off. She did lose some weight initially but says she just feels like she's plateaued and has been stuck at her current weight of 282 pounds. Gaol weight is 225. She would feel comfortable at that weight. She says after the birth of her son she initially was doing great with weight loss and then went on a course of steroids for her back and gained weight and ever since then she's had significant difficulty getting the weight back off. She knows that her excess weight is contributing in part to her back problem.  Review of Systems Chest pain, shortness of breath, palpitations. She has no prior history or current history of heart problems.    Objective:   Physical Exam  Constitutional: She is oriented to person, place, and time. She appears well-developed and well-nourished.  HENT:  Head: Normocephalic and atraumatic.  Cardiovascular: Normal rate, regular rhythm and normal heart sounds.   Pulmonary/Chest: Effort normal and breath sounds normal.  Neurological: She is alert and oriented to person, place, and time.  Skin: Skin is warm and dry.  Psychiatric: She has a normal mood and affect. Her behavior is normal.          Assessment & Plan:  Vaginitis - we'll go ahead and send her for Diflucan since it is Friday and when I get results back until Monday. The base of her symptoms I wonder if she may actually have bacterial vaginitis. We'll call with results of the wet prep on Monday.  BMI 44/abnormal weight gain  - we discussed different options. She's most interested in phentermine. She started doing a great job with her exercise but I did encourage her to increase the amount of time that she is exercising and to make sure to get in at least 5-6 days per week. Also encouraged her to use the Smart phone application called my [employer] to help keep track of her diet and exercise. She says she are to have a good on her phone and has used before. Encouraged her start using it again. I also think she would benefit from a nutrition counseling session. I'm referring her for that as well. Encouraged to take a few diarrhea with her said that can be reviewed during the office visit. We will start phentermine. Warned about potential side effects. She will need followup monthly for blood pressure and weight checks to the medication to be refilled. She is to stop immediately if she exhibits any chest pain short of breath or palpitations. She has no prior or current history of heart problems.

## 2013-04-09 NOTE — Assessment & Plan Note (Signed)
Resolved with conservative measures. 

## 2013-04-09 NOTE — Assessment & Plan Note (Signed)
We will start conservatively with home rehabilitation exercises. Like to see her back in about a month and if no better we will proceed with formal physical therapy.

## 2013-04-09 NOTE — Progress Notes (Signed)
  Subjective:    CC: Back pain  HPI: Right hip pain: Was likely due to to sacroiliitis as well as iliopsoas myositis, but this has since resolved.  Mid back pain: Localized in the midline and the paraspinal muscles in the upper lumbar vertebrae. Worse when sitting, bending, slightly worse with Valsalva. Pain is mild, persistent, she does desire conservative measures and rehabilitation modalities to improve her symptoms.  Past medical history, Surgical history, Family history not pertinant except as noted below, Social history, Allergies, and medications have been entered into the medical record, reviewed, and no changes needed.   Review of Systems: No fevers, chills, night sweats, weight loss, chest pain, or shortness of breath.   Objective:    General: Well Developed, well nourished, and in no acute distress.  Neuro: Alert and oriented x3, extra-ocular muscles intact, sensation grossly intact.  HEENT: Normocephalic, atraumatic, pupils equal round reactive to light, neck supple, no masses, no lymphadenopathy, thyroid nonpalpable.  Skin: Warm and dry, no rashes. Cardiac: Regular rate and rhythm, no murmurs rubs or gallops, no lower extremity edema.  Respiratory: Clear to auscultation bilaterally. Not using accessory muscles, speaking in full sentences. Back Exam:  Inspection: Unremarkable  Motion: Flexion 45 deg, Extension 45 deg, Side Bending to 45 deg bilaterally,  Rotation to 45 deg bilaterally  SLR laying: Negative  XSLR laying: Negative  Palpable tenderness: Midline upper lumbar. FABER: negative. Sensory change: Gross sensation intact to all lumbar and sacral dermatomes.  Reflexes: 2+ at both patellar tendons, 2+ at achilles tendons, Babinski's downgoing.  Strength at foot  Plantar-flexion: 5/5 Dorsi-flexion: 5/5 Eversion: 5/5 Inversion: 5/5  Leg strength  Quad: 5/5 Hamstring: 5/5 Hip flexor: 5/5 Hip abductors: 5/5  Gait unremarkable.  Impression and Recommendations:

## 2013-04-27 ENCOUNTER — Other Ambulatory Visit: Payer: Self-pay | Admitting: Family Medicine

## 2013-05-07 ENCOUNTER — Ambulatory Visit (INDEPENDENT_AMBULATORY_CARE_PROVIDER_SITE_OTHER): Payer: 59 | Admitting: Family Medicine

## 2013-05-07 ENCOUNTER — Encounter: Payer: Self-pay | Admitting: *Deleted

## 2013-05-07 VITALS — BP 136/77 | HR 91 | Wt 276.0 lb

## 2013-05-07 DIAGNOSIS — R635 Abnormal weight gain: Secondary | ICD-10-CM

## 2013-05-07 DIAGNOSIS — Z6841 Body Mass Index (BMI) 40.0 and over, adult: Secondary | ICD-10-CM

## 2013-05-07 DIAGNOSIS — N760 Acute vaginitis: Secondary | ICD-10-CM

## 2013-05-07 MED ORDER — PHENTERMINE HCL 37.5 MG PO CAPS: 37.5000 mg | ORAL_CAPSULE | ORAL | Status: DC

## 2013-05-07 NOTE — Progress Notes (Signed)
   Subjective:    Patient ID: Tammy Roman, female    DOB: [birth year], 33 y.o.   MRN: [id number]  HPI    Review of Systems     Objective:   Physical Exam        Assessment & Plan:  Abnormal weight gain-she's doing fantastic on the phentermine. She's lost 6 more pounds. Continue current regimen. She's tolerating it well without any side effects, chest pain shortness of breath or palpitations. Blood pressures well controlled. Will refill today. Followup in one month. Nani Gasseratherine Rusti Arizmendi, MD

## 2013-05-07 NOTE — Progress Notes (Signed)
Patient was in office for weight check and blood pressure check. Donnis Phaneuf,CMA

## 2013-05-13 ENCOUNTER — Other Ambulatory Visit: Payer: Self-pay | Admitting: Family Medicine

## 2013-05-18 ENCOUNTER — Other Ambulatory Visit: Payer: Self-pay | Admitting: Family Medicine

## 2013-06-04 ENCOUNTER — Encounter: Payer: Self-pay | Admitting: *Deleted

## 2013-06-04 ENCOUNTER — Ambulatory Visit (INDEPENDENT_AMBULATORY_CARE_PROVIDER_SITE_OTHER): Payer: 59 | Admitting: Family Medicine

## 2013-06-04 VITALS — BP 143/82 | HR 106 | Ht 67.0 in | Wt 268.0 lb

## 2013-06-04 DIAGNOSIS — R635 Abnormal weight gain: Secondary | ICD-10-CM

## 2013-06-04 MED ORDER — PHENTERMINE HCL 37.5 MG PO CAPS: ORAL_CAPSULE | ORAL | Status: DC

## 2013-06-04 NOTE — Progress Notes (Signed)
   Subjective:    Patient ID: Villa HerbNatalie L Soberanes, female    DOB: June 30, 1980, 33 y.o.   MRN: [id number]  HPI    Review of Systems     Objective:   Physical Exam        Assessment & Plan:  Abnormal weight gain-she's lost 8 pounds which is fantastic. Blood pressure is mildly elevated. We did keep an eye on this. Make sure following a low salt diet. Will refill phentermine today. Blood pressure still elevated next time then we may need to decrease her dose. Please let her know that. Nani Gasseratherine Deondrea Markos, MD

## 2013-06-04 NOTE — Progress Notes (Signed)
Patient was in office for weight and blood pressure check. Patient reported doing good today with no problems reported. Patient request refill on her Phentermine and Valtrex. Rhonda Cunningham,CMA

## 2013-06-07 ENCOUNTER — Other Ambulatory Visit: Payer: Self-pay | Admitting: *Deleted

## 2013-06-07 ENCOUNTER — Other Ambulatory Visit: Payer: Self-pay | Admitting: Family Medicine

## 2013-06-07 MED ORDER — VALACYCLOVIR HCL 500 MG PO TABS: ORAL_TABLET | ORAL | Status: DC

## 2013-07-05 ENCOUNTER — Other Ambulatory Visit: Payer: Self-pay | Admitting: *Deleted

## 2013-07-07 ENCOUNTER — Encounter: Payer: Self-pay | Admitting: Obstetrics & Gynecology

## 2013-07-07 ENCOUNTER — Ambulatory Visit (INDEPENDENT_AMBULATORY_CARE_PROVIDER_SITE_OTHER): Payer: 59 | Admitting: Obstetrics & Gynecology

## 2013-07-07 VITALS — BP 136/90 | HR 93 | Resp 16 | Ht 67.0 in | Wt 269.0 lb

## 2013-07-07 DIAGNOSIS — Z1151 Encounter for screening for human papillomavirus (HPV): Secondary | ICD-10-CM

## 2013-07-07 DIAGNOSIS — Z124 Encounter for screening for malignant neoplasm of cervix: Secondary | ICD-10-CM

## 2013-07-07 DIAGNOSIS — Z0000 Encounter for general adult medical examination without abnormal findings: Secondary | ICD-10-CM

## 2013-07-07 DIAGNOSIS — Z01419 Encounter for gynecological examination (general) (routine) without abnormal findings: Secondary | ICD-10-CM

## 2013-07-07 NOTE — Progress Notes (Signed)
Subjective:    Tammy Roman is a 33 y.o. M P2 ( 33yo and 33 yo)  female who presents for an annual exam. The patient has no complaints today except her IUD strings are bothering her husband and she would like the strings shortened. The patient is sexually active. GYN screening history: last pap: was normal. The patient wears seatbelts: yes. The patient participates in regular exercise: yes. Has the patient ever been transfused or tattooed?: no. The patient reports that there is not domestic violence in her life.   Menstrual History: OB History   Grav Para Term Preterm Abortions TAB SAB Ect Mult Living   2 2 2       2       Menarche age: 89  Patient's last menstrual period was 07/05/2013.    The following portions of the patient's history were reviewed and updated as appropriate: allergies, current medications, past family history, past medical history, past social history, past surgical history and problem list.  Review of Systems A comprehensive review of systems was negative.  Declines flu vaccine. Married for 4 years, denies dyspareunia. 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   Objective:    BP 136/90  Pulse 93  Resp 16  Ht 5\' 7"  (1.702 m)  Wt 269 lb (122.018 kg)  BMI 42.12 kg/m2  LMP 07/05/2013  General Appearance:    Alert, cooperative, no distress, appears stated age  Head:    Normocephalic, without obvious abnormality, atraumatic  Eyes:    PERRL, conjunctiva/corneas clear, EOM's intact, fundi    benign, both eyes  Ears:    Normal TM's and external ear canals, both ears  Nose:   Nares normal, septum midline, mucosa normal, no drainage    or sinus tenderness  Throat:   Lips, mucosa, and tongue normal; teeth and gums normal  Neck:   Supple, symmetrical, trachea midline, no adenopathy;    thyroid:  no enlargement/tenderness/nodules; no carotid   bruit or JVD  Back:     Symmetric, no curvature, ROM normal, no CVA tenderness  Lungs:     Clear to auscultation  bilaterally, respirations unlabored  Chest Wall:    No tenderness or deformity   Heart:    Regular rate and rhythm, S1 and S2 normal, no murmur, rub   or gallop  Breast Exam:    No tenderness, masses, or nipple abnormality  Abdomen:     Soft, non-tender, bowel sounds active all four quadrants,    no masses, no organomegaly, morbidly obese  Genitalia:    Normal female without lesion, discharge or tenderness, IUD strings cut flush with normal-appearing cervix, NSSA, NT, mobile, normal adnexal exam     Extremities:   Extremities normal, atraumatic, no cyanosis or edema  Pulses:   2+ and symmetric all extremities  Skin:   Skin color, texture, turgor normal, no rashes or lesions  Lymph nodes:   Cervical, supraclavicular, and axillary nodes normal  Neurologic:   CNII-XII intact, normal strength, sensation and reflexes    throughout  .    Assessment:    Healthy female exam.    Plan:     Breast self exam technique reviewed and patient encouraged to perform self-exam monthly. Thin prep Pap smear. with cotesting

## 2013-07-08 ENCOUNTER — Encounter: Payer: Self-pay | Admitting: Family Medicine

## 2013-07-08 ENCOUNTER — Ambulatory Visit (INDEPENDENT_AMBULATORY_CARE_PROVIDER_SITE_OTHER): Payer: 59 | Admitting: Family Medicine

## 2013-07-08 VITALS — BP 128/71 | HR 103 | Ht 69.0 in | Wt 269.0 lb

## 2013-07-08 DIAGNOSIS — R7301 Impaired fasting glucose: Secondary | ICD-10-CM

## 2013-07-08 DIAGNOSIS — Z0000 Encounter for general adult medical examination without abnormal findings: Secondary | ICD-10-CM

## 2013-07-08 LAB — POCT GLYCOSYLATED HEMOGLOBIN (HGB A1C): HEMOGLOBIN A1C: 5.9

## 2013-07-08 NOTE — Addendum Note (Signed)
Addended by: Deno EtienneBARKLEY, Sahmya Arai L on: 07/08/2013 09:06 AM   Modules accepted: Orders

## 2013-07-08 NOTE — Progress Notes (Signed)
   Subjective:    Patient ID: Tammy Roman, female    DOB: [birth year], 33 y.o.   MRN: [id number]  HPI IFG - due for repeat glucose. Hard time working out with this weather. She is starting zumba next week.   Lab Results  Component Value Date   HGBA1C 6.3 12/22/2012   Obesity/abnormal weight gain-she's been fantastic with her weight loss. Since last August she has lost 20 pounds. She plans on starting seen in the next week. She has been exercising some but has not over the last few weeks with the more severe weather that we have had. She's been eating more healthy. She hasn't been able to take the phentermine because she had problems with getting it with her insurance. Currently is pending a prior authorization. She denies any side effects with the medication. No chest pain or shortness of breath or palpitations. Her weight loss goal is 240 pounds.  Review of Systems     Objective:   Physical Exam  Constitutional: She is oriented to person, place, and time. She appears well-developed and well-nourished.  HENT:  Head: Normocephalic and atraumatic.  Cardiovascular: Normal rate, regular rhythm and normal heart sounds.   Pulmonary/Chest: Effort normal and breath sounds normal.  Neurological: She is alert and oriented to person, place, and time.  Skin: Skin is warm and dry.  Psychiatric: She has a normal mood and affect. Her behavior is normal.          Assessment & Plan:  IFG- Repeat A1C is down to 5.9.  Repeat in 6 months.  Due for CMP and lipids.  She is in a fantastic job.  Obesity/abnormal weight gain-down 20 pounds in the last 6 months. Doing fantastic. We'll work on prior authorization for her phentermine. Continue to work on getting regular exercise into her routine. Call if any problems. We'll need to followup once a month for refills on phentermine for blood pressure and weight checks. She's not having any side effects from the medication.

## 2013-07-13 ENCOUNTER — Other Ambulatory Visit: Payer: Self-pay | Admitting: *Deleted

## 2013-07-13 MED ORDER — VALACYCLOVIR HCL 500 MG PO TABS: ORAL_TABLET | ORAL | Status: DC

## 2013-07-15 ENCOUNTER — Encounter: Payer: 59 | Admitting: Family Medicine

## 2013-07-20 ENCOUNTER — Telehealth: Payer: Self-pay | Admitting: *Deleted

## 2013-07-20 NOTE — Telephone Encounter (Signed)
Called and informed pt that PA for phentermine was denied.Tammy Roman.[name], Viann Shoveonya Lynetta

## 2013-07-22 ENCOUNTER — Ambulatory Visit (INDEPENDENT_AMBULATORY_CARE_PROVIDER_SITE_OTHER): Payer: 59 | Admitting: Family Medicine

## 2013-07-22 ENCOUNTER — Telehealth: Payer: Self-pay | Admitting: *Deleted

## 2013-07-22 ENCOUNTER — Encounter: Payer: Self-pay | Admitting: Family Medicine

## 2013-07-22 VITALS — BP 132/93 | HR 92 | Wt 266.0 lb

## 2013-07-22 DIAGNOSIS — Z0000 Encounter for general adult medical examination without abnormal findings: Secondary | ICD-10-CM

## 2013-07-22 LAB — COMPLETE METABOLIC PANEL WITH GFR
ALBUMIN: 4 g/dL (ref 3.5–5.2)
ALT: 22 U/L (ref 0–35)
AST: 15 U/L (ref 0–37)
Alkaline Phosphatase: 114 U/L (ref 39–117)
BILIRUBIN TOTAL: 0.4 mg/dL (ref 0.2–1.2)
BUN: 12 mg/dL (ref 6–23)
CHLORIDE: 102 meq/L (ref 96–112)
CO2: 24 meq/L (ref 19–32)
Calcium: 9.2 mg/dL (ref 8.4–10.5)
Creat: 0.79 mg/dL (ref 0.50–1.10)
GFR, Est Non African American: >89 mL/min
Glucose, Bld: 123 mg/dL — ABNORMAL HIGH (ref 70–99)
POTASSIUM: 4.3 meq/L (ref 3.5–5.3)
SODIUM: 136 meq/L (ref 135–145)
TOTAL PROTEIN: 6.7 g/dL (ref 6.0–8.3)

## 2013-07-22 LAB — LIPID PANEL
Cholesterol: 157 mg/dL (ref 0–200)
HDL: 40 mg/dL (ref >39)
LDL CALC: 98 mg/dL (ref 0–99)
Total CHOL/HDL Ratio: 3.9 Ratio
Triglycerides: 93 mg/dL (ref <150)
VLDL: 19 mg/dL (ref 0–40)

## 2013-07-22 NOTE — Telephone Encounter (Signed)
PA approved for Phentermine.  Approval dates 07/22/13-10/22/13.

## 2013-07-22 NOTE — Patient Instructions (Signed)
Keep up a regular exercise program and make sure you are eating a healthy diet Try to eat 4 servings of dairy a day, or if you are lactose intolerant take a calcium with vitamin D daily.  Your vaccines are up to date.   

## 2013-07-22 NOTE — Progress Notes (Signed)
  Subjective:     Tammy Roman is a 33 y.o. female and is here for a comprehensive physical exam. The patient reports no problems.  History   Social History  . Marital Status: Married    Spouse Name: Kandee KeenCory    Number of Children: 1   . Years of Education: N/A   Occupational History  . CUSTOMER SERVICE    Social History Main Topics  . Smoking status: Never Smoker   . Smokeless tobacco: Never Used  . Alcohol Use: No     Comment: occasional  . Drug Use: No  . Sexual Activity: Yes    Partners: Male     Comment: works for [employer], married, finishing [employer] school, married,2 kids, no exercise.   Other Topics Concern  . Not on file   Social History Narrative   Some regular exercise.  No caffeine daily.    Health Maintenance  Topic Date Due  . Influenza Vaccine  08/03/2013 (Originally 12/04/2012)  . Pap Smear  07/07/2016  . Tetanus/tdap  04/10/2020    The following portions of the patient's history were reviewed and updated as appropriate: allergies, current medications, past family history, past medical history, past social history, past surgical history and problem list.  Review of Systems A comprehensive review of systems was negative.   Objective:    BP 132/93  Pulse 92  Wt 266 lb (120.657 kg)  SpO2 98%  LMP 07/05/2013 General appearance: alert, cooperative and appears stated age Head: Normocephalic, without obvious abnormality, atraumatic Eyes: conj clear, PEERLA, EOMI. Sclera injected on the right eye Ears: normal TM's and external ear canals both ears Nose: Nares normal. Septum midline. Mucosa normal. No drainage or sinus tenderness. Throat: lips, mucosa, and tongue normal; teeth and gums normal Neck: no adenopathy, no carotid bruit, no JVD, supple, symmetrical, trachea midline and thyroid not enlarged, symmetric, no tenderness/mass/nodules Back: symmetric, no curvature. ROM normal. No CVA tenderness. Lungs: clear to auscultation bilaterally Heart:  regular rate and rhythm, S1, S2 normal, no murmur, click, rub or gallop Abdomen: soft, non-tender; bowel sounds normal; no masses,  no organomegaly Extremities: extremities normal, atraumatic, no cyanosis or edema Pulses: 2+ and symmetric Skin: Skin color, texture, turgor normal. No rashes or lesions Lymph nodes: Cervical, supraclavicular, and axillary nodes normal. Neurologic: Alert and oriented X 3, normal strength and tone. Normal symmetric reflexes. Normal coordination and gait    Assessment:    Healthy female exam.      Plan:     See After Visit Summary for Counseling Recommendations  Keep up a regular exercise program and make sure you are eating a healthy diet Try to eat 4 servings of dairy a day, or if you are lactose intolerant take a calcium with vitamin D daily.  Your vaccines are up to date.   Right eye is injected-she says her contact followed up while she was driving here. She had difficult time getting it out but she finally did. I asked her to call me next couple days if she feels like it's not feeling better. I do want her to get an eye infection.

## 2013-08-23 ENCOUNTER — Ambulatory Visit (INDEPENDENT_AMBULATORY_CARE_PROVIDER_SITE_OTHER): Payer: 59 | Admitting: Family Medicine

## 2013-08-23 VITALS — BP 133/82 | HR 92 | Ht 67.0 in | Wt 265.0 lb

## 2013-08-23 DIAGNOSIS — R635 Abnormal weight gain: Secondary | ICD-10-CM

## 2013-08-23 MED ORDER — PHENTERMINE HCL 37.5 MG PO CAPS: ORAL_CAPSULE | ORAL | Status: DC

## 2013-08-23 NOTE — Progress Notes (Signed)
Patient was in office for weight and blood pressure check. Patient denied anything abnormal. Please advise patient want rx sent to [facility] [address] [place]. Rhonda Cunningham,CMA

## 2013-08-23 NOTE — Progress Notes (Signed)
   Subjective:    Patient ID: Tammy Roman, female    DOB: [birth year], 33 y.o.   MRN: [id number]  HPI    Review of Systems     Objective:   Physical Exam        Assessment & Plan:  Abnormal weight gain-she's down 1 pound from 4 weeks ago. She's tolerating the phentermine well without any side effects or problems. Blood pressures well regulated. Okay to refill medication but patient needs to be working on getting at least 30 minutes of exercise 5 days per week in addition to working on dietary changes for the medication to continue to be effective. Nani Gasseratherine Metheney, MD

## 2013-09-08 ENCOUNTER — Other Ambulatory Visit: Payer: Self-pay | Admitting: Family Medicine

## 2013-09-20 ENCOUNTER — Ambulatory Visit: Payer: 59

## 2013-10-25 ENCOUNTER — Ambulatory Visit: Payer: 59 | Admitting: Family Medicine

## 2013-11-12 ENCOUNTER — Other Ambulatory Visit: Payer: Self-pay

## 2013-11-12 MED ORDER — VALACYCLOVIR HCL 500 MG PO TABS: 500.0000 mg | ORAL_TABLET | Freq: Every day | ORAL | Status: DC

## 2014-01-07 ENCOUNTER — Ambulatory Visit: Payer: 59 | Admitting: Family Medicine

## 2014-01-21 ENCOUNTER — Ambulatory Visit: Payer: 59 | Admitting: Family Medicine

## 2014-04-18 ENCOUNTER — Other Ambulatory Visit: Payer: Self-pay

## 2014-04-18 ENCOUNTER — Other Ambulatory Visit: Payer: Self-pay | Admitting: Family Medicine

## 2014-04-18 MED ORDER — VALACYCLOVIR HCL 500 MG PO TABS: 500.0000 mg | ORAL_TABLET | Freq: Every day | ORAL | Status: DC

## 2014-07-04 ENCOUNTER — Telehealth: Payer: Self-pay | Admitting: Family Medicine

## 2014-07-04 DIAGNOSIS — Z01419 Encounter for gynecological examination (general) (routine) without abnormal findings: Secondary | ICD-10-CM

## 2014-07-04 NOTE — Telephone Encounter (Signed)
Patient called and changed her cpe to 07/15/14 due to new insurance. Pt req to get lab work sent down on March 8th she has an appointment in the building on that day. Thanks

## 2014-07-05 NOTE — Telephone Encounter (Signed)
Labs ordered and faxed.Taylorann Tkach Lynetta  

## 2014-07-08 ENCOUNTER — Ambulatory Visit (INDEPENDENT_AMBULATORY_CARE_PROVIDER_SITE_OTHER): Payer: 59 | Admitting: Family Medicine

## 2014-07-08 ENCOUNTER — Encounter: Payer: Self-pay | Admitting: Family Medicine

## 2014-07-08 VITALS — BP 140/88 | HR 100 | Wt 248.0 lb

## 2014-07-08 DIAGNOSIS — R300 Dysuria: Secondary | ICD-10-CM

## 2014-07-08 DIAGNOSIS — Z0189 Encounter for other specified special examinations: Secondary | ICD-10-CM

## 2014-07-08 DIAGNOSIS — E118 Type 2 diabetes mellitus with unspecified complications: Secondary | ICD-10-CM

## 2014-07-08 DIAGNOSIS — E1310 Other specified diabetes mellitus with ketoacidosis without coma: Principal | ICD-10-CM | POA: Diagnosis present

## 2014-07-08 DIAGNOSIS — B370 Candidal stomatitis: Secondary | ICD-10-CM

## 2014-07-08 DIAGNOSIS — N3000 Acute cystitis without hematuria: Secondary | ICD-10-CM

## 2014-07-08 DIAGNOSIS — Z0000 Encounter for general adult medical examination without abnormal findings: Secondary | ICD-10-CM

## 2014-07-08 DIAGNOSIS — E871 Hypo-osmolality and hyponatremia: Secondary | ICD-10-CM | POA: Diagnosis present

## 2014-07-08 DIAGNOSIS — E669 Obesity, unspecified: Secondary | ICD-10-CM | POA: Diagnosis present

## 2014-07-08 DIAGNOSIS — E13649 Other specified diabetes mellitus with hypoglycemia without coma: Secondary | ICD-10-CM | POA: Diagnosis present

## 2014-07-08 DIAGNOSIS — Z6841 Body Mass Index (BMI) 40.0 and over, adult: Secondary | ICD-10-CM

## 2014-07-08 DIAGNOSIS — E049 Nontoxic goiter, unspecified: Secondary | ICD-10-CM

## 2014-07-08 DIAGNOSIS — E872 Acidosis: Secondary | ICD-10-CM | POA: Diagnosis present

## 2014-07-08 LAB — POCT URINALYSIS DIPSTICK
BILIRUBIN UA: NEGATIVE
GLUCOSE UA: 500
LEUKOCYTES UA: NEGATIVE
Nitrite, UA: NEGATIVE
Protein, UA: 30
Spec Grav, UA: 1.025
Urobilinogen, UA: 0.2
pH, UA: 5.5

## 2014-07-08 LAB — GLUCOSE, POCT (MANUAL RESULT ENTRY): POC Glucose: 421 mg/dl — AB (ref 70–99)

## 2014-07-08 LAB — POCT UA - MICROALBUMIN
Albumin/Creatinine Ratio, Urine, POC: >300
Creatinine, POC: 100 mg/dL
Microalbumin Ur, POC: 150 mg/L

## 2014-07-08 LAB — POCT GLYCOSYLATED HEMOGLOBIN (HGB A1C): Hemoglobin A1C: 14

## 2014-07-08 MED ORDER — SITAGLIPTIN PHOS-METFORMIN HCL 50-1000 MG PO TABS: 1.0000 | ORAL_TABLET | Freq: Two times a day (BID) | ORAL | Status: DC

## 2014-07-08 MED ORDER — NYSTATIN 100000 UNIT/ML MT SUSP: 5.0000 mL | Freq: Four times a day (QID) | OROMUCOSAL | Status: DC

## 2014-07-08 MED ORDER — CIPROFLOXACIN HCL 500 MG PO TABS: 500.0000 mg | ORAL_TABLET | Freq: Two times a day (BID) | ORAL | Status: DC

## 2014-07-08 MED ORDER — INSULIN GLARGINE 300 UNIT/ML ~~LOC~~ SOPN: 10.0000 [IU] | PEN_INJECTOR | Freq: Every day | SUBCUTANEOUS | Status: DC

## 2014-07-08 NOTE — Progress Notes (Signed)
Subjective:    Patient ID: Tammy Roman, female    DOB: 26-Apr-1981, 34 y.o.   MRN: [id number]  HPI   Has been working out and trying to lose weight.  About a week ago she felt run down. She has been drinking more water and peeing a lot.   Thinks her sugars may be high.  Says her urine has been foamy and she has had unintentional weight loss.  Now gettting some pain when she pees. Now having itching and burning.    She feels like her reflux has ben worse hte last week. Says any food that aggrevates it. Can eat broth and drink water without any problems. Waking up at 6:01 with muscle spasm/cramping. Vomited twice this AM but then had chicken broth and kept it down.   Has cut out sugar since October and is actually tried to work on losing weight. She was last seen about a year ago for impaired fasting glucose and was supposed to follow-up in September.  She's also had a lot of pain in her mouth and her throat. She says the last few days it's actually been painful to eat so she has been sticking to broth which she seems to do okay with..   Review of Systems BP 140/88 mmHg  Pulse 100  Wt 248 lb (112.492 kg)  SpO2 100%    No Known Allergies  Past Medical History  Diagnosis Date  . Obesity   . HSV-2 (herpes simplex virus 2) infection   . No pertinent past medical history     History reviewed. No pertinent past surgical history.  History   Social History  . Marital Status: Married    Spouse Name: Kandee KeenCory  . Number of Children: 1   . Years of Education: N/A   Occupational History  . CUSTOMER SERVICE    Social History Main Topics  . Smoking status: Never Smoker   . Smokeless tobacco: Never Used  . Alcohol Use: No     Comment: occasional  . Drug Use: No  . Sexual Activity:    Partners: Male     Comment: works for [employer], married, finishing [employer] school, married,2 kids, no exercise.   Other Topics Concern  . Not on file   Social History Narrative   Some regular  exercise.  No caffeine daily.     Family History  Problem Relation Age of Onset  . Hyperlipidemia Mother   . Hypertension Mother   . Hyperlipidemia Father   . Hypertension Father   . Prostate cancer Father   . Cancer Father     prostate  . Breast cancer Paternal Aunt     metastasized     Outpatient Encounter Prescriptions as of 07/08/2014  Medication Sig  . ibuprofen (ADVIL,MOTRIN) 800 MG tablet Take 800 mg by mouth every 8 (eight) hours as needed. For pain.  . valACYclovir (VALTREX) 500 MG tablet Take 1 tablet (500 mg total) by mouth daily.  . AMBULATORY NON FORMULARY MEDICATION Medication Name:Glucometer, lancets and strips to test once daily. 90 days supplyt (Patient not taking: Reported on 07/08/2014)  . ciprofloxacin (CIPRO) 500 MG tablet Take 1 tablet (500 mg total) by mouth 2 (two) times daily. (Patient not taking: Reported on 07/08/2014)  . Insulin Glargine (TOUJEO SOLOSTAR) 300 UNIT/ML SOPN Inject 10 Units into the skin at bedtime. Ok to increase to 12 units after 5 days. (Patient not taking: Reported on 07/08/2014)  . nystatin (MYCOSTATIN) 100000 UNIT/ML suspension Take 5  mLs (500,000 Units total) by mouth 4 (four) times daily. X 10 days (Patient not taking: Reported on 07/08/2014)  . sitaGLIPtin-metformin (JANUMET) 50-1000 MG per tablet Take 1 tablet by mouth 2 (two) times daily with a meal. (Patient not taking: Reported on 07/08/2014)  . [DISCONTINUED] phentermine 37.5 MG capsule TAKE 1 TABLET BY MOUTH IN THE MORNING (Patient not taking: Reported on 07/08/2014)  . [DISCONTINUED] traMADol (ULTRAM) 50 MG tablet TAKE 1 TABLET EVERY 8 HOURS AS NEEDED MODERATE TO SEVERE PAIN (Patient not taking: Reported on 07/08/2014)          Objective:   Physical Exam  Constitutional: She is oriented to person, place, and time. She appears well-developed and well-nourished.  HENT:  Head: Normocephalic and atraumatic.  Right Ear: External ear normal.  Left Ear: External ear normal.  Nose: Nose  normal.  Mouth/Throat: Oropharynx is clear and moist.  TMs and canals are clear. Thrush on her tongue and posterior pharynx today.  Eyes: Conjunctivae and EOM are normal. Pupils are equal, round, and reactive to light.  Neck: Neck supple. Thyromegaly present.  Thyroid feels symmetric but borderline enlarged.  Cardiovascular: Normal rate, regular rhythm and normal heart sounds.   Pulmonary/Chest: Effort normal and breath sounds normal. She has no wheezes.  Lymphadenopathy:    She has no cervical adenopathy.  Neurological: She is alert and oriented to person, place, and time.  Skin: Skin is warm and dry.  Psychiatric: She has a normal mood and affect.       Assessment & Plan:  DM- new diagnosis. Hemoglobin A1c is great and 14 today. The last one was in the impaired fasting glucose range about a year ago. We'll go ahead and start her on combination therapy as well as insulin to try to get her down over the next few weeks. Her perception for glucometer, lancets and strips. Had her administer 10 units of Toujeo here in the office and showed her how to do it on her own. Gave her a sample to get started with. Work on increasing hydration over the next couple of days. If she vomits again or feels like she's getting worse or mentally appended eventually scheduled emergency department immediately. Given lab slip to check CMP and lipids etc.  Dysuria - could be secondary to the large amount of glucose that she is excreting but she has had pains on clinical ahead and put her on antibiotic.  Thrush-will treat with oral nystatin. Call if not better in 4-5 days. But make sure complete course to completely get rid of the infection.  Borderline enlarged thyroid-we'll check TSH.

## 2014-07-09 ENCOUNTER — Emergency Department (HOSPITAL_BASED_OUTPATIENT_CLINIC_OR_DEPARTMENT_OTHER): Payer: 59

## 2014-07-09 ENCOUNTER — Encounter (HOSPITAL_BASED_OUTPATIENT_CLINIC_OR_DEPARTMENT_OTHER): Payer: Self-pay | Admitting: *Deleted

## 2014-07-09 ENCOUNTER — Inpatient Hospital Stay (HOSPITAL_BASED_OUTPATIENT_CLINIC_OR_DEPARTMENT_OTHER)
Admission: EM | Admit: 2014-07-09 | Discharge: 2014-07-11 | DRG: 638 | Disposition: A | Discharge status: Home / Self Care | Payer: 59 | Attending: Family Medicine | Admitting: Family Medicine

## 2014-07-09 DIAGNOSIS — E0810 Diabetes mellitus due to underlying condition with ketoacidosis without coma: Secondary | ICD-10-CM

## 2014-07-09 DIAGNOSIS — E1110 Type 2 diabetes mellitus with ketoacidosis without coma: Secondary | ICD-10-CM | POA: Diagnosis present

## 2014-07-09 DIAGNOSIS — B370 Candidal stomatitis: Secondary | ICD-10-CM | POA: Diagnosis present

## 2014-07-09 DIAGNOSIS — E1310 Other specified diabetes mellitus with ketoacidosis without coma: Secondary | ICD-10-CM | POA: Diagnosis present

## 2014-07-09 DIAGNOSIS — B3781 Candidal esophagitis: Secondary | ICD-10-CM

## 2014-07-09 DIAGNOSIS — E669 Obesity, unspecified: Secondary | ICD-10-CM | POA: Diagnosis present

## 2014-07-09 DIAGNOSIS — E872 Acidosis: Secondary | ICD-10-CM | POA: Diagnosis present

## 2014-07-09 DIAGNOSIS — E13649 Other specified diabetes mellitus with hypoglycemia without coma: Secondary | ICD-10-CM | POA: Diagnosis present

## 2014-07-09 DIAGNOSIS — R634 Abnormal weight loss: Secondary | ICD-10-CM

## 2014-07-09 DIAGNOSIS — E871 Hypo-osmolality and hyponatremia: Secondary | ICD-10-CM | POA: Diagnosis present

## 2014-07-09 DIAGNOSIS — Z6841 Body Mass Index (BMI) 40.0 and over, adult: Secondary | ICD-10-CM | POA: Diagnosis not present

## 2014-07-09 DIAGNOSIS — R1115 Cyclical vomiting syndrome unrelated to migraine: Secondary | ICD-10-CM

## 2014-07-09 DIAGNOSIS — E1010 Type 1 diabetes mellitus with ketoacidosis without coma: Secondary | ICD-10-CM

## 2014-07-09 DIAGNOSIS — E119 Type 2 diabetes mellitus without complications: Secondary | ICD-10-CM

## 2014-07-09 HISTORY — DX: Type 2 diabetes mellitus without complications: E11.9

## 2014-07-09 LAB — I-STAT ARTERIAL BLOOD GAS, ED
Acid-base deficit: 21 mmol/L — ABNORMAL HIGH (ref 0.0–2.0)
Bicarbonate: 5.4 mEq/L — ABNORMAL LOW (ref 20.0–24.0)
O2 SAT: 97 %
PCO2 ART: 16.2 mmHg — AB (ref 35.0–45.0)
PO2 ART: 121 mmHg — AB (ref 80.0–100.0)
Patient temperature: 99
TCO2: 6 mmol/L (ref 0–100)
pH, Arterial: 7.129 — CL (ref 7.350–7.450)

## 2014-07-09 LAB — BASIC METABOLIC PANEL
ANION GAP: 11 (ref 5–15)
ANION GAP: 8 (ref 5–15)
ANION GAP: 11 (ref 5–15)
BUN: 6 mg/dL (ref 6–23)
BUN: 7 mg/dL (ref 6–23)
BUN: 6 mg/dL (ref 6–23)
CALCIUM: 8.1 mg/dL — AB (ref 8.4–10.5)
CHLORIDE: 116 mmol/L — AB (ref 96–112)
CHLORIDE: 110 mmol/L (ref 96–112)
CO2: 10 mmol/L — CL (ref 19–32)
CO2: 15 mmol/L — AB (ref 19–32)
CO2: 12 mmol/L — ABNORMAL LOW (ref 19–32)
CREATININE: 0.93 mg/dL (ref 0.50–1.10)
CREATININE: 0.9 mg/dL (ref 0.50–1.10)
Calcium: 7.5 mg/dL — ABNORMAL LOW (ref 8.4–10.5)
Calcium: 7.8 mg/dL — ABNORMAL LOW (ref 8.4–10.5)
Chloride: 117 mmol/L — ABNORMAL HIGH (ref 96–112)
Creatinine, Ser: 1.02 mg/dL (ref 0.50–1.10)
GFR calc Af Amer: >90 mL/min (ref >90)
GFR calc non Af Amer: 72 mL/min — ABNORMAL LOW (ref >90)
GFR calc non Af Amer: 80 mL/min — ABNORMAL LOW (ref >90)
GFR calc non Af Amer: 84 mL/min — ABNORMAL LOW (ref >90)
GFR, EST AFRICAN AMERICAN: 83 mL/min — AB (ref >90)
GLUCOSE: 178 mg/dL — AB (ref 70–99)
Glucose, Bld: 182 mg/dL — ABNORMAL HIGH (ref 70–99)
Glucose, Bld: 311 mg/dL — ABNORMAL HIGH (ref 70–99)
POTASSIUM: 3.7 mmol/L (ref 3.5–5.1)
Potassium: 4.1 mmol/L (ref 3.5–5.1)
Potassium: 3.8 mmol/L (ref 3.5–5.1)
Sodium: 138 mmol/L (ref 135–145)
Sodium: 139 mmol/L (ref 135–145)
Sodium: 133 mmol/L — ABNORMAL LOW (ref 135–145)

## 2014-07-09 LAB — COMPREHENSIVE METABOLIC PANEL
ALBUMIN: 4.1 g/dL (ref 3.5–5.2)
ALT: 27 U/L (ref 0–35)
ANION GAP: 16 — AB (ref 5–15)
AST: 14 U/L (ref 0–37)
Alkaline Phosphatase: 155 U/L — ABNORMAL HIGH (ref 39–117)
BUN: 10 mg/dL (ref 6–23)
CHLORIDE: 107 mmol/L (ref 96–112)
CO2: 9 mmol/L — AB (ref 19–32)
Calcium: 8.4 mg/dL (ref 8.4–10.5)
Creatinine, Ser: 0.98 mg/dL (ref 0.50–1.10)
GFR calc non Af Amer: 76 mL/min — ABNORMAL LOW (ref >90)
GFR, EST AFRICAN AMERICAN: 88 mL/min — AB (ref >90)
Glucose, Bld: 348 mg/dL — ABNORMAL HIGH (ref 70–99)
Potassium: 4.3 mmol/L (ref 3.5–5.1)
Sodium: 132 mmol/L — ABNORMAL LOW (ref 135–145)
Total Bilirubin: 1 mg/dL (ref 0.3–1.2)
Total Protein: 7.6 g/dL (ref 6.0–8.3)

## 2014-07-09 LAB — URINE MICROSCOPIC-ADD ON

## 2014-07-09 LAB — URINALYSIS, ROUTINE W REFLEX MICROSCOPIC
Bilirubin Urine: NEGATIVE
Glucose, UA: >1000 mg/dL — AB
LEUKOCYTES UA: NEGATIVE
Nitrite: NEGATIVE
Protein, ur: 100 mg/dL — AB
SPECIFIC GRAVITY, URINE: 1.039 — AB (ref 1.005–1.030)
Urobilinogen, UA: 0.2 mg/dL (ref 0.0–1.0)
pH: 5 (ref 5.0–8.0)

## 2014-07-09 LAB — CBC WITH DIFFERENTIAL/PLATELET
Basophils Absolute: 0 10*3/uL (ref 0.0–0.1)
Basophils Relative: 0 % (ref 0–1)
EOS ABS: 0 10*3/uL (ref 0.0–0.7)
Eosinophils Relative: 0 % (ref 0–5)
HEMATOCRIT: 47.5 % — AB (ref 36.0–46.0)
HEMOGLOBIN: 15.2 g/dL — AB (ref 12.0–15.0)
LYMPHS PCT: 23 % (ref 12–46)
Lymphs Abs: 2.1 10*3/uL (ref 0.7–4.0)
MCH: 26.2 pg (ref 26.0–34.0)
MCHC: 32 g/dL (ref 30.0–36.0)
MCV: 81.8 fL (ref 78.0–100.0)
MONO ABS: 1 10*3/uL (ref 0.1–1.0)
MONOS PCT: 10 % (ref 3–12)
Neutro Abs: 6 10*3/uL (ref 1.7–7.7)
Neutrophils Relative %: 67 % (ref 43–77)
PLATELETS: 259 10*3/uL (ref 150–400)
RBC: 5.81 MIL/uL — ABNORMAL HIGH (ref 3.87–5.11)
RDW: 14.1 % (ref 11.5–15.5)
WBC: 9.1 10*3/uL (ref 4.0–10.5)

## 2014-07-09 LAB — CBG MONITORING, ED
GLUCOSE-CAPILLARY: 292 mg/dL — AB (ref 70–99)
GLUCOSE-CAPILLARY: 243 mg/dL — AB (ref 70–99)
Glucose-Capillary: 217 mg/dL — ABNORMAL HIGH (ref 70–99)
Glucose-Capillary: 295 mg/dL — ABNORMAL HIGH (ref 70–99)

## 2014-07-09 LAB — I-STAT CG4 LACTIC ACID, ED: LACTIC ACID, VENOUS: 1.49 mmol/L (ref 0.5–2.0)

## 2014-07-09 LAB — GLUCOSE, CAPILLARY
GLUCOSE-CAPILLARY: 200 mg/dL — AB (ref 70–99)
GLUCOSE-CAPILLARY: 201 mg/dL — AB (ref 70–99)
GLUCOSE-CAPILLARY: 109 mg/dL — AB (ref 70–99)
GLUCOSE-CAPILLARY: 126 mg/dL — AB (ref 70–99)
GLUCOSE-CAPILLARY: 149 mg/dL — AB (ref 70–99)
Glucose-Capillary: 145 mg/dL — ABNORMAL HIGH (ref 70–99)
Glucose-Capillary: 119 mg/dL — ABNORMAL HIGH (ref 70–99)
Glucose-Capillary: 116 mg/dL — ABNORMAL HIGH (ref 70–99)
Glucose-Capillary: 178 mg/dL — ABNORMAL HIGH (ref 70–99)
Glucose-Capillary: 196 mg/dL — ABNORMAL HIGH (ref 70–99)
Glucose-Capillary: 234 mg/dL — ABNORMAL HIGH (ref 70–99)

## 2014-07-09 LAB — CK: Total CK: 125 U/L (ref 7–177)

## 2014-07-09 LAB — MRSA PCR SCREENING: MRSA by PCR: INVALID — AB

## 2014-07-09 LAB — PREGNANCY, URINE: PREG TEST UR: NEGATIVE

## 2014-07-09 MED ORDER — FLUCONAZOLE IN SODIUM CHLORIDE 200-0.9 MG/100ML-% IV SOLN
200.0000 mg | INTRAVENOUS | Status: DC
  Administered 2014-07-09 – 2014-07-10 (×2): 200 mg via INTRAVENOUS
  Filled 2014-07-09 (×5): qty 100

## 2014-07-09 MED ORDER — VALACYCLOVIR HCL 500 MG PO TABS
500.0000 mg | ORAL_TABLET | Freq: Every day | ORAL | Status: DC
  Administered 2014-07-10 – 2014-07-11 (×2): 500 mg via ORAL
  Filled 2014-07-09 (×2): qty 1

## 2014-07-09 MED ORDER — INSULIN ASPART 100 UNIT/ML ~~LOC~~ SOLN
0.0000 [IU] | Freq: Three times a day (TID) | SUBCUTANEOUS | Status: DC
  Administered 2014-07-09: 5 [IU] via SUBCUTANEOUS
  Administered 2014-07-09: 3 [IU] via SUBCUTANEOUS
  Administered 2014-07-10: 5 [IU] via SUBCUTANEOUS
  Administered 2014-07-10 – 2014-07-11 (×5): 8 [IU] via SUBCUTANEOUS

## 2014-07-09 MED ORDER — ONDANSETRON HCL 4 MG/2ML IJ SOLN: 4.0000 mg | Freq: Four times a day (QID) | INTRAMUSCULAR | Status: DC | PRN

## 2014-07-09 MED ORDER — SODIUM CHLORIDE 0.9 % IV SOLN
INTRAVENOUS | Status: DC
  Administered 2014-07-09: 2.3 [IU]/h via INTRAVENOUS

## 2014-07-09 MED ORDER — INSULIN ASPART 100 UNIT/ML ~~LOC~~ SOLN: 0.0000 [IU] | SUBCUTANEOUS | Status: DC

## 2014-07-09 MED ORDER — SODIUM CHLORIDE 0.9 % IV SOLN
1000.0000 mL | Freq: Once | INTRAVENOUS | Status: AC
  Administered 2014-07-09: 1000 mL via INTRAVENOUS

## 2014-07-09 MED ORDER — SODIUM CHLORIDE 0.9 % IV BOLUS (SEPSIS)
1000.0000 mL | Freq: Once | INTRAVENOUS | Status: AC
  Administered 2014-07-09: 1000 mL via INTRAVENOUS

## 2014-07-09 MED ORDER — POTASSIUM CHLORIDE CRYS ER 20 MEQ PO TBCR
20.0000 meq | EXTENDED_RELEASE_TABLET | Freq: Once | ORAL | Status: AC
Start: 2014-07-09 — End: 2014-07-09
  Administered 2014-07-09: 20 meq via ORAL
  Filled 2014-07-09: qty 1

## 2014-07-09 MED ORDER — ONDANSETRON 8 MG PO TBDP
8.0000 mg | ORAL_TABLET | Freq: Once | ORAL | Status: AC
Start: 2014-07-09 — End: 2014-07-09
  Administered 2014-07-09: 8 mg via ORAL
  Filled 2014-07-09: qty 1

## 2014-07-09 MED ORDER — DEXTROSE-NACL 5-0.45 % IV SOLN
INTRAVENOUS | Status: DC
  Administered 2014-07-09: 06:00:00 via INTRAVENOUS

## 2014-07-09 MED ORDER — SODIUM CHLORIDE 0.9 % IV SOLN: 1000.0000 mL | INTRAVENOUS | Status: DC

## 2014-07-09 MED ORDER — HEPARIN SODIUM (PORCINE) 5000 UNIT/ML IJ SOLN
5000.0000 [IU] | Freq: Three times a day (TID) | INTRAMUSCULAR | Status: DC
  Administered 2014-07-09 – 2014-07-11 (×7): 5000 [IU] via SUBCUTANEOUS
  Filled 2014-07-09 (×6): qty 1

## 2014-07-09 MED ORDER — INSULIN GLARGINE 100 UNIT/ML ~~LOC~~ SOLN
10.0000 [IU] | Freq: Every day | SUBCUTANEOUS | Status: DC
  Administered 2014-07-09 – 2014-07-10 (×2): 10 [IU] via SUBCUTANEOUS
  Filled 2014-07-09 (×3): qty 0.1

## 2014-07-09 MED ORDER — SODIUM CHLORIDE 0.9 % IV SOLN
1000.0000 mL | Freq: Once | INTRAVENOUS | Status: AC
Start: 2014-07-09 — End: 2014-07-09
  Administered 2014-07-09: 1000 mL via INTRAVENOUS

## 2014-07-09 NOTE — ED Notes (Signed)
MD at bedside. 

## 2014-07-09 NOTE — Progress Notes (Signed)
Pt. Arrived to the unit via wheelchair accompanied by nurse tech. Pt. Is alert and oriented with no signs of distress noted. Pt. Vitals appear stable with no skin issues noted. Pt. Ambulated from wheelchair to the bed and tolerated well. Educated pt. On use of staff numbers, room telephone and call bell. Call light within reach. Orders released. No further needs noted at this time.

## 2014-07-09 NOTE — ED Notes (Addendum)
Seen by PCP today (see notes and labs), dx'd with new onset DM today, cbg earlier was 400 earlier today at PCP, was given insulin, d/c plan was to hydrate, pt unable to keep fluids down, here for nv, also reports weak, lethargic & light headed. (denies: fever, cough, congestion, cold sx or diarrhea), has a known current UTI, unable to keep meds down.

## 2014-07-09 NOTE — Progress Notes (Addendum)
CRITICAL VALUE ALERT  Critical value received: CO2=10  Date of notification:  07-09-14  Time of notification:  0900  Critical value read back:Yes.    Nurse who received alert:  Corliss SkainsJuan Basim Bartnik RN   MD notified (1st page): Jeanmarie Plantammy Parret NP  Time of first page:  0900  MD notified (2nd page):  Time of second page:  Responding MD:  Jeanmarie Plantammy Parret NP  Time MD responded:  0900

## 2014-07-09 NOTE — ED Provider Notes (Signed)
CSN: [id number]     Arrival date & time 07/08/14  2351 History   First MD Initiated Contact with Patient 07/09/14 0148     Chief Complaint  Patient presents with  . Emesis     (Consider location/radiation/quality/duration/timing/severity/associated sxs/prior Treatment) Patient is a 34 y.o. female presenting with vomiting. The history is provided by the patient.  Emesis Severity:  Moderate Timing:  Intermittent Quality:  Stomach contents Progression:  Unchanged Chronicity:  New Recent urination:  Increased Relieved by:  Nothing Worsened by:  Nothing tried Ineffective treatments:  None tried Associated symptoms: myalgias   Risk factors: diabetes     Past Medical History  Diagnosis Date  . Obesity   . HSV-2 (herpes simplex virus 2) infection   . No pertinent past medical history   . Diabetes mellitus without complication    History reviewed. No pertinent past surgical history. Family History  Problem Relation Age of Onset  . Hyperlipidemia Mother   . Hypertension Mother   . Hyperlipidemia Father   . Hypertension Father   . Prostate cancer Father   . Cancer Father     prostate  . Breast cancer Paternal Aunt     metastasized    History  Substance Use Topics  . Smoking status: Never Smoker   . Smokeless tobacco: Never Used  . Alcohol Use: No     Comment: occasional   OB History    Gravida Para Term Preterm AB TAB SAB Ectopic Multiple Living   2 2 2       2      Review of Systems  Constitutional: Negative for fever.  Respiratory: Negative for shortness of breath.   Cardiovascular: Negative for chest pain.  Gastrointestinal: Positive for vomiting.  Musculoskeletal: Positive for myalgias.  All other systems reviewed and are negative.     Allergies  Review of patient's allergies indicates no known allergies.  Home Medications   Prior to Admission medications   Medication Sig Start Date End Date Taking? Authorizing Provider  AMBULATORY NON FORMULARY  MEDICATION Medication Name:Glucometer, lancets and strips to test once daily. 90 days supplyt Patient not taking: Reported on 07/08/2014 09/24/12   Agapito Gamesatherine D Metheney, MD  ciprofloxacin (CIPRO) 500 MG tablet Take 1 tablet (500 mg total) by mouth 2 (two) times daily. Patient not taking: Reported on 07/08/2014 07/08/14 07/11/14  Agapito Gamesatherine D Metheney, MD  ibuprofen (ADVIL,MOTRIN) 800 MG tablet Take 800 mg by mouth every 8 (eight) hours as needed. For pain. 05/25/12   Megan Morris, DO  Insulin Glargine (TOUJEO SOLOSTAR) 300 UNIT/ML SOPN Inject 10 Units into the skin at bedtime. Ok to increase to 12 units after 5 days. Patient not taking: Reported on 07/08/2014 07/08/14   Agapito Gamesatherine D Metheney, MD  nystatin (MYCOSTATIN) 100000 UNIT/ML suspension Take 5 mLs (500,000 Units total) by mouth 4 (four) times daily. X 10 days Patient not taking: Reported on 07/08/2014 07/08/14   Agapito Gamesatherine D Metheney, MD  sitaGLIPtin-metformin (JANUMET) 50-1000 MG per tablet Take 1 tablet by mouth 2 (two) times daily with a meal. Patient not taking: Reported on 07/08/2014 07/08/14   Agapito Gamesatherine D Metheney, MD  valACYclovir (VALTREX) 500 MG tablet Take 1 tablet (500 mg total) by mouth daily. 04/18/14   Agapito Gamesatherine D Metheney, MD   BP 120/65 mmHg  Pulse 105  Temp(Src) 99.4 F (37.4 C) (Oral)  Resp 16  Ht 5\' 7"  (1.702 m)  Wt 248 lb (112.492 kg)  BMI 38.83 kg/m2  SpO2 100%  LMP 07/08/2014 Physical Exam  Constitutional: She is oriented to person, place, and time. She appears well-developed and well-nourished.  HENT:  Head: Normocephalic and atraumatic.  thrush  Eyes: Conjunctivae and EOM are normal. Pupils are equal, round, and reactive to light.  Neck: Normal range of motion. Neck supple.  Cardiovascular: Regular rhythm.  Tachycardia present.   Pulmonary/Chest: Effort normal and breath sounds normal. Tachypnea noted. She has no wheezes. She has no rales.  Abdominal: Soft. Bowel sounds are normal. There is no tenderness. There is no rebound and  no guarding.  Musculoskeletal: Normal range of motion.  Neurological: She is alert and oriented to person, place, and time.  Skin: Skin is warm and dry. She is not diaphoretic.  Psychiatric: She has a normal mood and affect.    ED Course  Procedures (including critical care time) Labs Review Labs Reviewed  CBC WITH DIFFERENTIAL/PLATELET - Abnormal; Notable for the following:    RBC 5.81 (*)    Hemoglobin 15.2 (*)    HCT 47.5 (*)    All other components within normal limits  COMPREHENSIVE METABOLIC PANEL - Abnormal; Notable for the following:    Sodium 132 (*)    CO2 9 (*)    Glucose, Bld 348 (*)    Alkaline Phosphatase 155 (*)    GFR calc non Af Amer 76 (*)    GFR calc Af Amer 88 (*)    Anion gap 16 (*)    All other components within normal limits  URINALYSIS, ROUTINE W REFLEX MICROSCOPIC - Abnormal; Notable for the following:    Specific Gravity, Urine 1.039 (*)    Glucose, UA >1000 (*)    Hgb urine dipstick LARGE (*)    Ketones, ur >80 (*)    Protein, ur 100 (*)    All other components within normal limits  URINE MICROSCOPIC-ADD ON - Abnormal; Notable for the following:    Squamous Epithelial / LPF FEW (*)    Bacteria, UA FEW (*)    Casts GRANULAR CAST (*)    All other components within normal limits  CBG MONITORING, ED - Abnormal; Notable for the following:    Glucose-Capillary 295 (*)    All other components within normal limits  PREGNANCY, URINE  CK  I-STAT CG4 LACTIC ACID, ED    Imaging Review No results found.   EKG Interpretation None      MDM   Final diagnoses:  Diabetes    Medications  fluconazole (DIFLUCAN) IVPB 200 mg (not administered)  insulin regular (NOVOLIN R,HUMULIN R) 250 Units in sodium chloride 0.9 % 250 mL (1 Units/mL) infusion (2.3 Units/hr Intravenous New Bag/Given 07/09/14 0410)  0.9 %  sodium chloride infusion (1,000 mLs Intravenous New Bag/Given 07/09/14 0418)    Followed by  0.9 %  sodium chloride infusion (not administered)     Followed by  0.9 %  sodium chloride infusion (not administered)  dextrose 5 %-0.45 % sodium chloride infusion (not administered)  ondansetron (ZOFRAN-ODT) disintegrating tablet 8 mg (8 mg Oral Given 07/09/14 0025)  sodium chloride 0.9 % bolus 1,000 mL (0 mLs Intravenous Stopped 07/09/14 0418)   Results for orders placed or performed during the hospital encounter of 07/09/14  CBC with Differential/Platelet  Result Value Ref Range   WBC 9.1 4.0 - 10.5 K/uL   RBC 5.81 (H) 3.87 - 5.11 MIL/uL   Hemoglobin 15.2 (H) 12.0 - 15.0 g/dL   HCT 16.1 (H) 09.6 - 04.5 %   MCV 81.8 78.0 - 100.0 fL   MCH 26.2  26.0 - 34.0 pg   MCHC 32.0 30.0 - 36.0 g/dL   RDW 16.1 09.6 - 04.5 %   Platelets 259 150 - 400 K/uL   Neutrophils Relative % 67 43 - 77 %   Neutro Abs 6.0 1.7 - 7.7 K/uL   Lymphocytes Relative 23 12 - 46 %   Lymphs Abs 2.1 0.7 - 4.0 K/uL   Monocytes Relative 10 3 - 12 %   Monocytes Absolute 1.0 0.1 - 1.0 K/uL   Eosinophils Relative 0 0 - 5 %   Eosinophils Absolute 0.0 0.0 - 0.7 K/uL   Basophils Relative 0 0 - 1 %   Basophils Absolute 0.0 0.0 - 0.1 K/uL  Comprehensive metabolic panel  Result Value Ref Range   Sodium 132 (L) 135 - 145 mmol/L   Potassium 4.3 3.5 - 5.1 mmol/L   Chloride 107 96 - 112 mmol/L   CO2 9 (LL) 19 - 32 mmol/L   Glucose, Bld 348 (H) 70 - 99 mg/dL   BUN 10 6 - 23 mg/dL   Creatinine, Ser 4.09 0.50 - 1.10 mg/dL   Calcium 8.4 8.4 - 81.1 mg/dL   Total Protein 7.6 6.0 - 8.3 g/dL   Albumin 4.1 3.5 - 5.2 g/dL   AST 14 0 - 37 U/L   ALT 27 0 - 35 U/L   Alkaline Phosphatase 155 (H) 39 - 117 U/L   Total Bilirubin 1.0 0.3 - 1.2 mg/dL   GFR calc non Af Amer 76 (L) >90 mL/min   GFR calc Af Amer 88 (L) >90 mL/min   Anion gap 16 (H) 5 - 15  Urinalysis, Routine w reflex microscopic  Result Value Ref Range   Color, Urine YELLOW YELLOW   APPearance CLEAR CLEAR   Specific Gravity, Urine 1.039 (H) 1.005 - 1.030   pH 5.0 5.0 - 8.0   Glucose, UA >1000 (A) NEGATIVE mg/dL   Hgb urine  dipstick LARGE (A) NEGATIVE   Bilirubin Urine NEGATIVE NEGATIVE   Ketones, ur >80 (A) NEGATIVE mg/dL   Protein, ur 914 (A) NEGATIVE mg/dL   Urobilinogen, UA 0.2 0.0 - 1.0 mg/dL   Nitrite NEGATIVE NEGATIVE   Leukocytes, UA NEGATIVE NEGATIVE  Pregnancy, urine  Result Value Ref Range   Preg Test, Ur NEGATIVE NEGATIVE  Urine microscopic-add on  Result Value Ref Range   Squamous Epithelial / LPF FEW (A) RARE   WBC, UA 0-2 <3 WBC/hpf   RBC / HPF 7-10 <3 RBC/hpf   Bacteria, UA FEW (A) RARE   Casts GRANULAR CAST (A) NEGATIVE   Urine-Other FEW YEAST   CK  Result Value Ref Range   Total CK 125 7 - 177 U/L  CBG monitoring, ED  Result Value Ref Range   Glucose-Capillary 295 (H) 70 - 99 mg/dL  I-Stat CG4 Lactic Acid, ED  Result Value Ref Range   Lactic Acid, Venous 1.49 0.5 - 2.0 mmol/L  CBG monitoring, ED  Result Value Ref Range   Glucose-Capillary 292 (H) 70 - 99 mg/dL  I-Stat arterial blood gas, ED  Result Value Ref Range   pH, Arterial 7.129 (LL) 7.350 - 7.450   pCO2 arterial 16.2 (LL) 35.0 - 45.0 mmHg   pO2, Arterial 121.0 (H) 80.0 - 100.0 mmHg   Bicarbonate 5.4 (L) 20.0 - 24.0 mEq/L   TCO2 6 0 - 100 mmol/L   O2 Saturation 97.0 %   Acid-base deficit 21.0 (H) 0.0 - 2.0 mmol/L   Patient temperature 99.0 F  Collection site RADIAL, ALLEN'S TEST ACCEPTABLE    Drawn by RT    Sample type ARTERIAL    Comment NOTIFIED PHYSICIAN    Dg Chest 2 View  07/09/2014   CLINICAL DATA:  Acute onset of nausea and vomiting. Weakness, lethargy and lightheadedness. Initial encounter.  EXAM: CHEST  2 VIEW  COMPARISON:  None.  FINDINGS: The lungs are well-aerated and clear. There is no evidence of focal opacification, pleural effusion or pneumothorax. Density at the left lung base is thought to reflect confluence of overlying osseous structures.  The heart is normal in size; the mediastinal contour is within normal limits. No acute osseous abnormalities are seen.  IMPRESSION: No acute cardiopulmonary  process seen.   Electronically Signed   By: Roanna Raider M.D.   On: 07/09/2014 04:43    MDM Reviewed: previous chart, nursing note and vitals Reviewed previous: labs Interpretation: labs, ECG and x-ray (anion gap 16 elevated ketones consistent with DKA nacpd on cxr by me) Total time providing critical care: 75-105 minutes. This excludes time spent performing separately reportable procedures and services. Consults: critical care  CRITICAL CARE Performed by: Jasmine Awe Total critical care time: 91 minutes Critical care time was exclusive of separately billable procedures and treating other patients. Critical care was necessary to treat or prevent imminent or life-threatening deterioration. Critical care was time spent personally by me on the following activities: development of treatment plan with patient and/or surrogate as well as nursing, discussions with consultants, evaluation of patient's response to treatment, examination of patient, obtaining history from patient or surrogate, ordering and performing treatments and interventions, ordering and review of laboratory studies, ordering and review of radiographic studies, pulse oximetry and re-evaluation of patient's condition.      Jasmine Awe, MD 07/09/14 [id number]

## 2014-07-09 NOTE — Progress Notes (Signed)
Report given to Tiffany RN from [facility], contacted food services to send meal tray to [id number].  Corliss SkainsJuan Ismerai Bin RN

## 2014-07-09 NOTE — H&P (Signed)
PULMONARY / CRITICAL CARE MEDICINE   Name: Tammy Roman MRN: [id number] DOB: 30-Jun-1980    ADMISSION DATE:  07/09/2014  REFERRING MD :  EDP   CHIEF COMPLAINT:  DKA   INITIAL PRESENTATION: 34 yo new dx DM presented with 5 days of n/v found to be in DKA. CCM to admit to ICU   STUDIES:    SIGNIFICANT EVENTS:    HISTORY OF PRESENT ILLNESS:  34 yo female with  Hx of HSV on valtrex presented to PCP 3/4 with n/v for 4 days with polydipsia.  Found to have new dx of DM with A1C 14 , started on insuline /DM regimen.  Pt with no improvement with worsening n/v/.  In ER found to be in DKA . Anion gap 16 , HCO3 9 .  ABG with pH 7.129.  She was started on insulin drip and aggressive fluid resusciatation.  This morning she is feeling better with decreased nausea and resolved vomitting.  BS are trending down.  She denies chest pain, orthopnea, edema or fever.  Does complain of yeast in mouth and possible vaginal yeast infection with dysuria .    PAST MEDICAL HISTORY :   has a past medical history of Obesity; HSV-2 (herpes simplex virus 2) infection; No pertinent past medical history; and Diabetes mellitus without complication.  has no past surgical history on file. Prior to Admission medications   Medication Sig Start Date End Date Taking? Authorizing Provider  AMBULATORY NON FORMULARY MEDICATION Medication Name:Glucometer, lancets and strips to test once daily. 90 days supplyt Patient not taking: Reported on 07/08/2014 09/24/12   Agapito Games, MD  ciprofloxacin (CIPRO) 500 MG tablet Take 1 tablet (500 mg total) by mouth 2 (two) times daily. Patient not taking: Reported on 07/08/2014 07/08/14 07/11/14  Agapito Games, MD  ibuprofen (ADVIL,MOTRIN) 800 MG tablet Take 800 mg by mouth every 8 (eight) hours as needed. For pain. 05/25/12   Megan Morris, DO  Insulin Glargine (TOUJEO SOLOSTAR) 300 UNIT/ML SOPN Inject 10 Units into the skin at bedtime. Ok to increase to 12 units after 5  days. Patient not taking: Reported on 07/08/2014 07/08/14   Agapito Games, MD  nystatin (MYCOSTATIN) 100000 UNIT/ML suspension Take 5 mLs (500,000 Units total) by mouth 4 (four) times daily. X 10 days Patient not taking: Reported on 07/08/2014 07/08/14   Agapito Games, MD  sitaGLIPtin-metformin (JANUMET) 50-1000 MG per tablet Take 1 tablet by mouth 2 (two) times daily with a meal. Patient not taking: Reported on 07/08/2014 07/08/14   Agapito Games, MD  valACYclovir (VALTREX) 500 MG tablet Take 1 tablet (500 mg total) by mouth daily. 04/18/14   Agapito Games, MD   No Known Allergies  FAMILY HISTORY:  has no family status information on file.  SOCIAL HISTORY:  reports that she has never smoked. She has never used smokeless tobacco. She reports that she does not drink alcohol or use illicit drugs.  REVIEW OF SYSTEMS:   Constitutional:   No  weight loss, night sweats,  Fevers, chills, ++fatigue, or  lassitude.  HEENT:   No headaches,  Difficulty swallowing,  Tooth/dental problems, or  Sore throat,                No sneezing, itching, ear ache, nasal congestion, post nasal drip,   CV:  No chest pain,  Orthopnea, PND, swelling in lower extremities, anasarca,  palpitations, syncope.   GI  + heartburn, indigestion, abdominal pain, nausea, vomiting, diarrhea,  change in bowel habits, loss of appetite,  NO bloody stools.   Resp: No shortness of breath with exertion or at rest.  No excess mucus, no productive cough,  No non-productive cough,  No coughing up of blood.  No change in color of mucus.  No wheezing.  No chest wall deformity  Skin: no rash or lesions.  GU: +++ dysuria, change in color of urine,  urgency or frequency.  No flank pain, no hematuria   MS:  No joint pain or swelling.  No decreased range of motion.  No back pain.  Psych:  No change in mood or affect. No depression or anxiety.  No memory loss.       SUBJECTIVE:  Feeling better but very thirsty    VITAL SIGNS: Temp:  [98.5 F (36.9 C)-99.4 F (37.4 C)] 98.5 F (36.9 C) (03/05 0740) Pulse Rate:  [98-118] 101 (03/05 0730) Resp:  [16-24] 16 (03/05 0700) BP: (115-140)/(53-88) 119/76 mmHg (03/05 0700) SpO2:  [100 %] 100 % (03/05 0730) Weight:  [112.492 kg (248 lb)] 112.492 kg (248 lb) (03/05 0016) HEMODYNAMICS:   VENTILATOR SETTINGS:   INTAKE / OUTPUT:  Intake/Output Summary (Last 24 hours) at 07/09/14 0829 Last data filed at 07/09/14 0700  Gross per 24 hour  Intake   9.98 ml  Output      0 ml  Net   9.98 ml    PHYSICAL EXAMINATION:  GEN: A/Ox3; pleasant , NAD, well nourished  , obese   HEENT:  [place]/AT,   THROAT-clear, white coating along tongue   NECK:  Supple w/ fair ROM; no JVD; normal carotid impulses w/o bruits; no thyromegaly or nodules palpated; no lymphadenopathy.  RESP  Clear  P & A; w/o, wheezes/ rales/ or rhonchi.no accessory muscle use, no dullness to percussion  CARD:  RRR, no m/r/g  , no peripheral edema, pulses intact, no cyanosis or clubbing.  GI:   Soft & nt; nml bowel sounds; no organomegaly or masses detected.  Musco: Warm bil, no deformities or joint swelling noted.   Neuro: alert, no focal deficits noted.    Skin: Warm, no lesions or rashes   LABS:  CBC  Recent Labs Lab 07/09/14 0255  WBC 9.1  HGB 15.2*  HCT 47.5*  PLT 259   Coag's No results for input(s): APTT, INR in the last 168 hours. BMET  Recent Labs Lab 07/09/14 0255  NA 132*  K 4.3  CL 107  CO2 9*  BUN 10  CREATININE 0.98  GLUCOSE 348*   Electrolytes  Recent Labs Lab 07/09/14 0255  CALCIUM 8.4   Sepsis Markers  Recent Labs Lab 07/09/14 0325  LATICACIDVEN 1.49   ABG  Recent Labs Lab 07/09/14 0450  PHART 7.129*  PCO2ART 16.2*  PO2ART 121.0*   Liver Enzymes  Recent Labs Lab 07/09/14 0255  AST 14  ALT 27  ALKPHOS 155*  BILITOT 1.0  ALBUMIN 4.1   Cardiac Enzymes No results for input(s): TROPONINI, PROBNP in the last 168  hours. Glucose  Recent Labs Lab 07/09/14 0015 07/09/14 0353 07/09/14 0510 07/09/14 0559  GLUCAP 295* 292* 243* 217*    Imaging No results found.   ASSESSMENT / PLAN:  PULMONARY CXR clear 3/5  A: No apparent acute issue  P:   Monitor  Keep sats >92%   CARDIOVASCULAR  A:  No apparent acute issues -b/p nml   EKG w/ NSR  P:  Monitor  Hep/DVT   RENAL A: Anion Gap  Metabolic Acidosis  -  DKA  3/5 >AG 16  Hyponatremia  P:   Serial BMET  Fluid resuscitation   GASTROINTESTINAL A:  N/V secondary to DKA  P:   Fluids  Zofran As needed    HEMATOLOGIC A:  No Apparent Issue  P:  Trend   INFECTIOUS A:  Oral Candidiasis  P:   Check UTI  UC 3/5 >> Diflucan 3/5 >   ENDOCRINE A:  DM -new dx  DKA   P:   Insulin Drip    NEUROLOGIC A:  Intact  P:   Monitor    FAMILY  - Updates: none present   - Inter-disciplinary family meet or Palliative Care meeting due by:  3/12     TODAY'S SUMMARY: 34 yo new dx DM admitted with DKA  Responding to IVF and Insulin drip.      [facility] CenterARRETT,Jalesa Thien NP-C  Pulmonary and Critical Care Medicine [facility] Pager: [phone]  07/09/2014, 8:29 AM

## 2014-07-10 DIAGNOSIS — E1310 Other specified diabetes mellitus with ketoacidosis without coma: Principal | ICD-10-CM

## 2014-07-10 LAB — PHOSPHORUS: Phosphorus: 1.7 mg/dL — ABNORMAL LOW (ref 2.3–4.6)

## 2014-07-10 LAB — GLUCOSE, CAPILLARY
GLUCOSE-CAPILLARY: 274 mg/dL — AB (ref 70–99)
GLUCOSE-CAPILLARY: 282 mg/dL — AB (ref 70–99)
Glucose-Capillary: 228 mg/dL — ABNORMAL HIGH (ref 70–99)
Glucose-Capillary: 296 mg/dL — ABNORMAL HIGH (ref 70–99)

## 2014-07-10 LAB — BASIC METABOLIC PANEL
Anion gap: 9 (ref 5–15)
CO2: 13 mmol/L — AB (ref 19–32)
Calcium: 8 mg/dL — ABNORMAL LOW (ref 8.4–10.5)
Chloride: 109 mmol/L (ref 96–112)
Creatinine, Ser: 0.81 mg/dL (ref 0.50–1.10)
GLUCOSE: 279 mg/dL — AB (ref 70–99)
POTASSIUM: 3.8 mmol/L (ref 3.5–5.1)
Sodium: 131 mmol/L — ABNORMAL LOW (ref 135–145)

## 2014-07-10 LAB — LACTIC ACID, PLASMA: Lactic Acid, Venous: 0.9 mmol/L (ref 0.5–2.0)

## 2014-07-10 LAB — MAGNESIUM: MAGNESIUM: 1.8 mg/dL (ref 1.5–2.5)

## 2014-07-10 MED ORDER — INSULIN STARTER KIT- SYRINGES (ENGLISH)
1.0000 | Freq: Once | Status: AC
  Administered 2014-07-10: 1
  Filled 2014-07-10: qty 1

## 2014-07-10 MED ORDER — LIVING WELL WITH DIABETES BOOK
Freq: Once | Status: AC
  Administered 2014-07-10: 10:00:00
  Filled 2014-07-10: qty 1

## 2014-07-10 MED ORDER — SODIUM CHLORIDE 0.9 % IV SOLN
INTRAVENOUS | Status: DC
  Administered 2014-07-10 – 2014-07-11 (×2): via INTRAVENOUS

## 2014-07-10 NOTE — Progress Notes (Signed)
[facility] PROGRESS NOTE  Tammy Roman ZOX:[id number] DOB: 1981-02-25 DOA: 07/09/2014 PCP: Nani GasserMETHENEY,CATHERINE, MD  Assessment/Plan:  Active Problems:   DKA (diabetic ketoacidoses) - Patient successfully transitioned to subcutaneous insulin regimen. - We'll continue IV fluid hydration with normal saline and discontinue D5 administration - Advance diet to diabetic diet - Reassess BMP next a.m. Of note CO2 levels low on last check although anion gap closed with calculated value of 9  Code Status: full Family Communication: no family at bedside.  Disposition Plan: Pending improvement in condition.   Consultants:  None  Procedures:  none  Antibiotics:  None  HPI/Subjective: Pt has no new complaints. States that she feels better than when she initially presented.  Objective: Filed Vitals:   07/10/14 1323  BP: 138/65  Pulse: 99  Temp: 98.6 F (37 C)  Resp: 16    Intake/Output Summary (Last 24 hours) at 07/10/14 1345 Last data filed at 07/10/14 1155  Gross per 24 hour  Intake 2164.77 ml  Output   1170 ml  Net 994.77 ml   Filed Weights   07/09/14 0016 07/09/14 1759  Weight: 112.492 kg (248 lb) 116.801 kg (257 lb 8 oz)    Exam:   General:  Patient in no acute distress, alert and awake  Cardiovascular: Regular rate and rhythm, no murmurs or rubs  Respiratory: clear to auscultation bilaterally, no wheezes  Abdomen: Soft, nondistended, nontender  Musculoskeletal: No cyanosis or clubbing on limited exam  Data Reviewed: Basic Metabolic Panel:  Recent Labs Lab 07/09/14 0255 07/09/14 0825 07/09/14 1435 07/09/14 2138 07/10/14 0520  NA 132* 138 139 133* 131*  K 4.3 4.1 3.7 3.8 3.8  CL 107 117* 116* 110 109  CO2 9* 10* 15* 12* 13*  GLUCOSE 348* 182* 178* 311* 279*  BUN 10 6 7 6  <5*  CREATININE 0.98 1.02 0.93 0.90 0.81  CALCIUM 8.4 7.5* 7.8* 8.1* 8.0*  MG  --   --   --   --  1.8  PHOS  --   --   --   --  1.7*   Liver Function  Tests:  Recent Labs Lab 07/09/14 0255  AST 14  ALT 27  ALKPHOS 155*  BILITOT 1.0  PROT 7.6  ALBUMIN 4.1   No results for input(s): LIPASE, AMYLASE in the last 168 hours. No results for input(s): AMMONIA in the last 168 hours. CBC:  Recent Labs Lab 07/09/14 0255  WBC 9.1  NEUTROABS 6.0  HGB 15.2*  HCT 47.5*  MCV 81.8  PLT 259   Cardiac Enzymes:  Recent Labs Lab 07/09/14 0255  CKTOTAL 125   BNP (last 3 results) No results for input(s): BNP in the last 8760 hours.  ProBNP (last 3 results) No results for input(s): PROBNP in the last 8760 hours.  CBG:  Recent Labs Lab 07/09/14 1539 07/09/14 1758 07/09/14 2154 07/10/14 0753 07/10/14 1231  GLUCAP 178* 196* 234* 274* 282*    Recent Results (from the past 240 hour(s))  MRSA PCR Screening     Status: Abnormal   Collection Time: 07/09/14  6:42 AM  Result Value Ref Range Status   MRSA by PCR INVALID RESULTS, SPECIMEN SENT FOR CULTURE (A) NEGATIVE Final    Comment:        The GeneXpert MRSA Assay (FDA approved for NASAL specimens only), is one component of a comprehensive MRSA colonization surveillance program. It is not intended to diagnose MRSA infection nor to guide or monitor treatment for MRSA infections.  MRSA culture     Status: None (Preliminary result)   Collection Time: 07/09/14  6:42 AM  Result Value Ref Range Status   Specimen Description NOSE  Final   Special Requests NONE  Final   Culture NO GROWTH Performed at [facility]   Final   Report Status PENDING  Incomplete     Studies: Dg Chest 2 View  07/09/2014   CLINICAL DATA:  Acute onset of nausea and vomiting. Weakness, lethargy and lightheadedness. Initial encounter.  EXAM: CHEST  2 VIEW  COMPARISON:  None.  FINDINGS: The lungs are well-aerated and clear. There is no evidence of focal opacification, pleural effusion or pneumothorax. Density at the left lung base is thought to reflect confluence of overlying osseous structures.   The heart is normal in size; the mediastinal contour is within normal limits. No acute osseous abnormalities are seen.  IMPRESSION: No acute cardiopulmonary process seen.   Electronically Signed   By: Roanna Raider M.D.   On: 07/09/2014 04:43    Scheduled Meds: . fluconazole (DIFLUCAN) IV  200 mg Intravenous Q24H  . heparin subcutaneous  5,000 Units Subcutaneous 3 times per day  . insulin aspart  0-15 Units Subcutaneous TID AC & HS  . insulin glargine  10 Units Subcutaneous QHS  . valACYclovir  500 mg Oral Daily   Continuous Infusions: . sodium chloride 100 mL/hr at 07/10/14 1337     Time spent: > 25 minutes    Penny Pia  [facility] Pager [phone] If 7PM-7AM, please contact night-coverage at [email or web address], password [facility] 07/10/2014, 1:45 PM  LOS: 1 day

## 2014-07-11 LAB — BASIC METABOLIC PANEL
Anion gap: 8 (ref 5–15)
BUN: 5 mg/dL — AB (ref 6–23)
CHLORIDE: 106 mmol/L (ref 96–112)
CO2: 17 mmol/L — ABNORMAL LOW (ref 19–32)
Calcium: 8 mg/dL — ABNORMAL LOW (ref 8.4–10.5)
Creatinine, Ser: 0.91 mg/dL (ref 0.50–1.10)
GFR calc non Af Amer: 83 mL/min — ABNORMAL LOW (ref >90)
Glucose, Bld: 347 mg/dL — ABNORMAL HIGH (ref 70–99)
Potassium: 3.1 mmol/L — ABNORMAL LOW (ref 3.5–5.1)
SODIUM: 131 mmol/L — AB (ref 135–145)

## 2014-07-11 LAB — GLUCOSE, CAPILLARY
GLUCOSE-CAPILLARY: 292 mg/dL — AB (ref 70–99)
Glucose-Capillary: 263 mg/dL — ABNORMAL HIGH (ref 70–99)
Glucose-Capillary: 278 mg/dL — ABNORMAL HIGH (ref 70–99)

## 2014-07-11 LAB — URINE CULTURE: Colony Count: 80000

## 2014-07-11 MED ORDER — INSULIN DETEMIR 100 UNIT/ML ~~LOC~~ SOLN: 20.0000 [IU] | Freq: Every day | SUBCUTANEOUS | Status: DC

## 2014-07-11 MED ORDER — FLUCONAZOLE 200 MG PO TABS
200.0000 mg | ORAL_TABLET | Freq: Every day | ORAL | Status: DC
  Administered 2014-07-11: 200 mg via ORAL
  Filled 2014-07-11: qty 1

## 2014-07-11 MED ORDER — BLOOD GLUCOSE METER KIT: PACK | Status: DC

## 2014-07-11 NOTE — Plan of Care (Signed)
Problem: Food- and Nutrition-Related Knowledge Deficit (NB-1.1) Goal: Nutrition education Formal process to instruct or train a patient/client in a skill or to impart knowledge to help patients/clients voluntarily manage or modify food choices and eating behavior to maintain or improve health. Outcome: Completed/Met Date Met:  07/11/14  RD consulted for nutrition education regarding diabetes.    Dietetic Intern provided "Carbohydrate Counting for People with Diabetes" handout from the Academy of Nutrition and Dietetics. Discussed different food groups and their effects on blood sugar, emphasizing carbohydrate-containing foods. Provided list of carbohydrates and recommended serving sizes of common foods.  Discussed importance of eating 3 meals a day or every 3-4 hours. Provided examples of ways to balance meals/snacks and encouraged intake of high-fiber, whole grain complex carbohydrates.  Discussed the plate method with patient. Teach back method used  Expect good compliance.  Pt states she will be using an app on her smart phone to monitor her PO intake with her meds and glucose levels.  States this app will allow her to share this information with her doctor. Pt has watched videos and seems very informed and ready to change at home.  Also states she will have measuring cups and scales at home to measure out her carbohydrate portions.  Body mass index is 40.32 kg/(m^2). Pt meets criteria for Obesity class III based on current BMI.  Current diet order is CHO mod, patient is consuming approximately 100% of meals at this time. Labs and medications reviewed. No further nutrition interventions warranted at this time. RD contact information provided. If additional nutrition issues arise, please re-consult RD.  Elmer Picker MS Dietetic Intern Pager Number [phone]

## 2014-07-11 NOTE — Discharge Summary (Signed)
Physician Discharge Summary  Tammy Roman ZHY:[id number] DOB: 1981/04/16 DOA: 07/09/2014  PCP: Nani Gasser, MD  Admit date: 07/09/2014 Discharge date: 07/11/2014  Time spent: > 35 minutes  Recommendations for Outpatient Follow-up:  1. Please assess blood glucose levels and adjust hypoglycemic agents accordingly.  Discharge Diagnoses:  Active Problems:   DKA (diabetic ketoacidoses)   Discharge Condition: stable  Diet recommendation: diabetic diet.  Filed Weights   07/09/14 0016 07/09/14 1759  Weight: 112.492 kg (248 lb) 116.801 kg (257 lb 8 oz)    History of present illness:  Patient is a 34 y/o with history of obesity and DM on oral hypoglycemic agent at home who presented with DKA.  Hospital Course:  DKA - Patient was initially placed on insulin gtt. She was sucessfully transitioned to SQ insulin regimen.  - Will discharge on home oral hypoglycemic agent janumet (sitagliptin-metformin) and Levemir 20 units sq daily - hba1c of 14 - Patient has been instructed to monitor her blood sugars atleast 2 times daily fasting and postprandial. She is to review her blood sugars with her pcp for further recommendations. - She has been instructed to monitor her blood sugars daily and if she has a blood sugar of < 70 she is to take a snack and decrease her Levemir by 5 units. If her blood sugars are > 300 she is to call her pcp for further recommendations.   Procedures:  None  Consultations:  None  Discharge Exam: Filed Vitals:   07/11/14 0450  BP: 106/69  Pulse: 93  Temp: 98.4 F (36.9 C)  Resp:     General: Pt in nad, alert and awake Cardiovascular: rrr, no mrg Respiratory: cta bl, no wheezes  Discharge Instructions   Discharge Instructions    Call MD for:  extreme fatigue    Complete by:  As directed      Call MD for:  severe uncontrolled pain    Complete by:  As directed      Call MD for:  temperature >100.4    Complete by:  As directed      Diet -  low sodium heart healthy    Complete by:  As directed      Discharge instructions    Complete by:  As directed   Should you have any blood glucose values of 70 or below. Eat a snack and decrease your lantus by 5 units.  Should blood sugars be elevated > 300 consistently call you primary care physician for further evaluation and recommendations. Please keep your appointment with your pcp for this Friday 07/15/14     Increase activity slowly    Complete by:  As directed           Current Discharge Medication List    START taking these medications   Details  insulin detemir (LEVEMIR) 100 UNIT/ML injection Inject 0.2 mLs (20 Units total) into the skin at bedtime. Qty: 10 mL, Refills: 11      CONTINUE these medications which have NOT CHANGED   Details  ibuprofen (ADVIL,MOTRIN) 800 MG tablet Take 800 mg by mouth every 8 (eight) hours as needed. For pain.    levonorgestrel (MIRENA) 20 MCG/24HR IUD 1 each by Intrauterine route once.    valACYclovir (VALTREX) 500 MG tablet Take 1 tablet (500 mg total) by mouth daily. Qty: 30 tablet, Refills: 2    sitaGLIPtin-metformin (JANUMET) 50-1000 MG per tablet Take 1 tablet by mouth 2 (two) times daily with a meal. Qty: 60 tablet, Refills: 3  STOP taking these medications     AMBULATORY NON FORMULARY MEDICATION      ciprofloxacin (CIPRO) 500 MG tablet      Insulin Glargine (TOUJEO SOLOSTAR) 300 UNIT/ML SOPN      nystatin (MYCOSTATIN) 100000 UNIT/ML suspension        No Known Allergies    The results of significant diagnostics from this hospitalization (including imaging, microbiology, ancillary and laboratory) are listed below for reference.    Significant Diagnostic Studies: Dg Chest 2 View  07/09/2014   CLINICAL DATA:  Acute onset of nausea and vomiting. Weakness, lethargy and lightheadedness. Initial encounter.  EXAM: CHEST  2 VIEW  COMPARISON:  None.  FINDINGS: The lungs are well-aerated and clear. There is no evidence of focal  opacification, pleural effusion or pneumothorax. Density at the left lung base is thought to reflect confluence of overlying osseous structures.  The heart is normal in size; the mediastinal contour is within normal limits. No acute osseous abnormalities are seen.  IMPRESSION: No acute cardiopulmonary process seen.   Electronically Signed   By: Roanna RaiderJeffery  Chang M.D.   On: 07/09/2014 04:43    Microbiology: Recent Results (from the past 240 hour(s))  MRSA PCR Screening     Status: Abnormal   Collection Time: 07/09/14  6:42 AM  Result Value Ref Range Status   MRSA by PCR INVALID RESULTS, SPECIMEN SENT FOR CULTURE (A) NEGATIVE Final    Comment:        The GeneXpert MRSA Assay (FDA approved for NASAL specimens only), is one component of a comprehensive MRSA colonization surveillance program. It is not intended to diagnose MRSA infection nor to guide or monitor treatment for MRSA infections.   MRSA culture     Status: None (Preliminary result)   Collection Time: 07/09/14  6:42 AM  Result Value Ref Range Status   Specimen Description NOSE  Final   Special Requests NONE  Final   Culture NO GROWTH Performed at [facility]   Final   Report Status PENDING  Incomplete  Urine culture     Status: None   Collection Time: 07/09/14 10:12 PM  Result Value Ref Range Status   Specimen Description URINE, CLEAN CATCH  Final   Special Requests NONE  Final   Colony Count   Final    80,000 COLONIES/ML Performed at [facility]    Culture   Final    Multiple bacterial morphotypes present, none predominant. Suggest appropriate recollection if clinically indicated. Performed at [facility]    Report Status 07/11/2014 FINAL  Final     Labs: Basic Metabolic Panel:  Recent Labs Lab 07/09/14 0255 07/09/14 0825 07/09/14 1435 07/09/14 2138 07/10/14 0520  NA 132* 138 139 133* 131*  K 4.3 4.1 3.7 3.8 3.8  CL 107 117* 116* 110 109  CO2 9* 10* 15* 12* 13*  GLUCOSE 348*  182* 178* 311* 279*  BUN 10 6 7 6  <5*  CREATININE 0.98 1.02 0.93 0.90 0.81  CALCIUM 8.4 7.5* 7.8* 8.1* 8.0*  MG  --   --   --   --  1.8  PHOS  --   --   --   --  1.7*   Liver Function Tests:  Recent Labs Lab 07/09/14 0255  AST 14  ALT 27  ALKPHOS 155*  BILITOT 1.0  PROT 7.6  ALBUMIN 4.1   No results for input(s): LIPASE, AMYLASE in the last 168 hours. No results for input(s): AMMONIA in the last  168 hours. CBC:  Recent Labs Lab 07/09/14 0255  WBC 9.1  NEUTROABS 6.0  HGB 15.2*  HCT 47.5*  MCV 81.8  PLT 259   Cardiac Enzymes:  Recent Labs Lab 07/09/14 0255  CKTOTAL 125   BNP: BNP (last 3 results) No results for input(s): BNP in the last 8760 hours.  ProBNP (last 3 results) No results for input(s): PROBNP in the last 8760 hours.  CBG:  Recent Labs Lab 07/10/14 1231 07/10/14 1740 07/10/14 2200 07/11/14 0410 07/11/14 0748  GLUCAP 282* 228* 296* 263* 278*       Signed:  Penny Pia  [facility] 07/11/2014, 9:35 AM

## 2014-07-11 NOTE — Progress Notes (Addendum)
Inpatient Diabetes Program Recommendations  AACE/ADA: New Consensus Statement on Inpatient Glycemic Control (2013)  Target Ranges:  Prepandial:   less than 140 mg/dL      Peak postprandial:   less than 180 mg/dL (1-2 hours)      Critically ill patients:  140 - 180 mg/dL   Results for Tammy Roman, Tammy Roman (MRN [id number]) as of 07/11/2014 08:36  Ref. Range 07/10/2014 07:53 07/10/2014 12:31 07/10/2014 17:40 07/10/2014 22:00 07/11/2014 04:10 07/11/2014 07:48  Glucose-Capillary Latest Range: 70-99 mg/dL 274 (H) 282 (H) 228 (H) 296 (H) 263 (H) 278 (H)    Reason for Visit: N/V, DKA   Diabetes history: New diagnosis this admission Outpatient Diabetes medications: None Current orders for Inpatient glycemic control: Lantus 10 units QHS, Novolog 0-15 units TID  Inpatient Diabetes Program Recommendations Insulin - Basal: Patient required 37 units of Novolog yesterday. The patient is continuing to have her glucose 250 and above. Fasting glucose this am  was 278 mg/dl. Please consider increasing basal insulin to at least .2 units/kg (Lantus 23 units QHS). Insulin - Meal Coverage: Please consider Novolog 4 units TID with meals for meal coverage in addition to correction scale if patient is consuming at least 50% of meals.   Consult Note: Spoke with pt about new diabetes diagnosis. Patient just saw her PCP Friday [facility]) and at that time was placed on Janumet and Toujeo Due to her A1c of 14%. Patient was not given a glucose meter at that time. Patient was told that priority was fluids that day. She took her Toujeo dose in the office and could not keep anything down and proceeded to the ED. Discussed A1C results (14%) and explained what an A1C is, basic pathophysiology of DM Type 2, basic home care, importance of checking CBGs and maintaining good CBG control to prevent long-term and short-term complications. I spoke with patient about checking her glucose 4 times a day before meals and at supper time. This  way her PCP would be able to adjust medications accordingly. Reviewed signs and symptoms of hyperglycemia and hypoglycemia along with treatment for both. 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 Spoke with patient about holding off on taking th Toujeo until her scheduled follow up appointment this coming Friday 3/11. Patient also has a Engineer, maintenance at work and she is expecting a call today and will follow up with them for further educational needs outpatient. I discussed basal insulin with her and when she should take it. RN provided ongoing basic DM education at bedside with this patient and engage patient to actively check blood glucose and administer insulin injections. Have ordered educational booklet, insulin starter kit, and DM videos.   Thanks,  Tama Headings RN, MSN, [facility] Inpatient Diabetes Coordinator Team Pager [phone]

## 2014-07-11 NOTE — Discharge Instructions (Signed)
Blood Glucose Monitoring °Monitoring your blood glucose (also know as blood sugar) helps you to manage your diabetes. It also helps you and your health care provider monitor your diabetes and determine how well your treatment plan is working. °WHY SHOULD YOU MONITOR YOUR BLOOD GLUCOSE? °· It can help you understand how food, exercise, and medicine affect your blood glucose. °· It allows you to know what your blood glucose is at any given moment. You can quickly tell if you are having low blood glucose (hypoglycemia) or high blood glucose (hyperglycemia). °· It can help you and your health care provider know how to adjust your medicines. °· It can help you understand how to manage an illness or adjust medicine for exercise. °WHEN SHOULD YOU TEST? °Your health care provider will help you decide how often you should check your blood glucose. This may depend on the type of diabetes you have, your diabetes control, or the types of medicines you are taking. Be sure to write down all of your blood glucose readings so that this information can be reviewed with your health care provider. See below for examples of testing times that your health care provider may suggest. °Type 1 Diabetes °· Test 4 times a day if you are in good control, using an insulin pump, or perform multiple daily injections. °· If your diabetes is not well controlled or if you are sick, you may need to monitor more often. °· It is a good idea to also monitor: °· Before and after exercise. °· Between meals and 2 hours after a meal. °· Occasionally between 2:00 a.m. and 3:00 a.m. °Type 2 Diabetes °· It can vary with each person, but generally, if you are on insulin, test 4 times a day. °· If you take medicines by mouth (orally), test 2 times a day. °· If you are on a controlled diet, test once a day. °· If your diabetes is not well controlled or if you are sick, you may need to monitor more often. °HOW TO MONITOR YOUR BLOOD GLUCOSE °Supplies  Needed °· Blood glucose meter. °· Test strips for your meter. Each meter has its own strips. You must use the strips that go with your own meter. °· A pricking needle (lancet). °· A device that holds the lancet (lancing device). °· A journal or log book to write down your results. °Procedure °· Wash your hands with soap and water. Alcohol is not preferred. °· Prick the side of your finger (not the tip) with the lancet. °· Gently milk the finger until a small drop of blood appears. °· Follow the instructions that come with your meter for inserting the test strip, applying blood to the strip, and using your blood glucose meter. °Other Areas to Get Blood for Testing °Some meters allow you to use other areas of your body (other than your finger) to test your blood. These areas are called alternative sites. The most common alternative sites are: °· The forearm. °· The thigh. °· The back area of the lower leg. °· The palm of the hand. °The blood flow in these areas is slower. Therefore, the blood glucose values you get may be delayed, and the numbers are different from what you would get from your fingers. Do not use alternative sites if you think you are having hypoglycemia. Your reading will not be accurate. Always use a finger if you are having hypoglycemia. Also, if you cannot feel your lows (hypoglycemia unawareness), always use your fingers for your   blood glucose checks. ADDITIONAL TIPS FOR GLUCOSE MONITORING  Do not reuse lancets.  Always carry your supplies with you.  All blood glucose meters have a 24-hour "hotline" number to call if you have questions or need help.  Adjust (calibrate) your blood glucose meter with a control solution after finishing a few boxes of strips. BLOOD GLUCOSE RECORD KEEPING It is a good idea to keep a daily record or log of your blood glucose readings. Most glucose meters, if not all, keep your glucose records stored in the meter. Some meters come with the ability to download  your records to your home computer. Keeping a record of your blood glucose readings is especially helpful if you are wanting to look for patterns. Make notes to go along with the blood glucose readings because you might forget what happened at that exact time. Keeping good records helps you and your health care provider to work together to achieve good diabetes management.  Document Released: 04/25/2003 Document Revised: 09/06/2013 Document Reviewed: 09/14/2012 [facility] Patient Information 2015 [place], [place]. This information is not intended to replace advice given to you by your health care provider. Make sure you discuss any questions you have with your health care provider.  Correction Insulin Your health care provider has decided you need to take insulin regularly. You have been given a correction scale (also called a sliding scale) in case you need extra insulin when your blood sugar is too high (hyperglycemia). The following instructions will assist you in how to use that correction scale.  WHAT IS A CORRECTION SCALE?  When you check your blood sugar, sometimes it will be higher than your health care provider has told you it should be. You may need an extra dose of insulin to bring your blood sugar to the recommended level (also known as your goal, target, or normal level). The correction scale is prescribed by your health care provider based on your specific needs.  Your correction scale has two parts:   The first shows you a blood sugar range.   The second part tells you how much extra insulin to give yourself if your blood sugar falls within this range. You will not need an extra dose of insulin if your blood glucose is in the desired range. You should simply give yourself the normal amount of insulin that your health care provider has ordered for you.  WHY IS IT IMPORTANT TO KEEP YOUR BLOOD SUGAR LEVELS AT YOUR DESIRED LEVEL?  Keeping your blood sugar at the desired level helps to  prevent long-term complications of diabetes, such as eye disease, kidney failure, nerve damage, and other serious complications. WHAT TYPE OF INSULIN WILL YOU USE?  To help bring down blood sugar levels that are too high, your health care provider will prescribe a short-acting or a rapid-acting insulin. An example of a short-acting insulin would be regular insulin. Remember, you may also have a longer-acting insulin prescribed for you.  WHAT DO YOU NEED TO DO?   Check your blood sugar with your home blood glucose meter as recommended by your health care provider.   Using your correction scale, find the range that your blood sugar lies in.   Look for the units of insulin that match that blood sugar range. Give yourself the dose of correction insulin your health care provider has prescribed. Always make sure you are using the right type of insulin.   Prior to the injection, make sure you have food available that you can eat  in the next 15-30 minutes.   If your correction insulin is rapid acting, start eating your meal within 15 minutes after you have given yourself the insulin injection. If you wait longer than 15 minutes to eat, your blood sugar might get too low.   If your correction insulin is short acting(regular), start eating your meal within 30 minutes after you have given yourself the insulin injection. If you wait longer than 30 minutes to eat, your blood sugar might get too low. Symptoms of low blood sugar (hypoglycemia) may include feeling shaky or weak, sweating, feeling confused, difficulty seeing, agitation, crankiness, or numbness of the lips or tongue. Check your blood sugar immediately and treat your results as directed by your health care provider.   Keep a log of your blood sugar results with the time you took the test and the amount of insulin that you injected. This information will help your health care provider manage your medicines.   Note on your log anything that  may affect your blood sugar level, such as:   Changes in normal exercise or activity.   Changes in your normal schedule, such as staying up late, going on vacation, changing your diet, or holidays.   New medicines. This includes prescription and over-the-counter medicines. Some medicines may cause high blood sugar.   Sickness, stress, or anxiety.   Changes in the time you took your medicine.   Changes in your meals, such as skipping a meal, having a late meal, or dining out.   Eating things that may affect blood glucose, such as snacks, meal portions that are larger than normal, drinks with sugar, or eating less than usual.   Ask your health care provider any questions you have.  Be aware of "stacking" your insulin doses. This happens when you correct a high blood sugar level by giving yourself extra insulin too soon after a previous correction dose or mealtime dose. You may then have too much insulin still active in your body and may be at risk for hypoglycemia. WHY DO YOU NEED A CORRECTION SCALE IF YOU HAVE NEVER BEEN DIAGNOSED WITH DIABETES?   Keeping your blood glucose in the target range is important for your overall health.   You may have been prescribed medicines that cause your blood glucose to be higher than normal. WHEN SHOULD YOU SEEK MEDICAL CARE? Contact your health care provider if:   You have experienced hypoglycemia that you are unable to treat with your usual routine.   You have a high blood sugar level that is not coming down with the correction dose.  Your blood sugar is often too low or does not come up even if you eat a fast-acting carbohydrate. Someone who lives with you should seek immediate medical care if you become unresponsive. Document Released: 09/13/2010 Document Revised: 12/23/2012 Document Reviewed: 10/02/2012 [facility] Patient Information 2015 [place]. This information is not intended to replace advice given to you by your health  care provider. Make sure you discuss any questions you have with your health care provider.  Diabetic Ketoacidosis Diabetic ketoacidosis (DKA) is a life-threatening complication of type 1 diabetes. It must be quickly recognized and treated. Treatment requires hospitalization. CAUSES  When there is no insulin in the body, glucose (sugar) cannot be used, and the body breaks down fat for energy. When fat breaks down, acids (ketones) build up in the blood. Very high levels of glucose and high levels of acids lead to severe loss of body fluids (dehydration) and  other dangerous chemical changes. This stresses your vital organs and can cause coma or death. SIGNS AND SYMPTOMS   Tiredness (fatigue).  Weight loss.  Excessive thirst.  Ketones in your urine.  Light-headedness.  Fruity or sweet smelling breath.  Excessive urination.  Visual changes.  Confusion or irritability.  Nausea or vomiting.  Rapid breathing.  Stomachache or abdominal pain. DIAGNOSIS  Your health care provider will diagnose DKA based on your history, physical exam, and blood tests. The health care provider will check to see if you have another illness that caused you to go into DKA. Most of this will be done quickly in an emergency room. TREATMENT   Fluid replacement to correct dehydration.  Insulin.  Correction of electrolytes, such as potassium and sodium.  Antibiotic medicines. PREVENTION  Always take your insulin. Do not skip your insulin injections.  If you are sick, treat yourself quickly. Your body often needs more insulin to fight the illness.  Check your blood glucose regularly.  Check urine ketones if your blood glucose is greater than 240 milligrams per deciliter (mg/dL).  Do not use outdated (expired) insulin.  If your blood glucose is high, drink plenty of fluids. This helps flush out ketones. HOME CARE INSTRUCTIONS   If you are sick, follow the advice of your health care provider.  To  prevent dehydration, drink enough water and fluids to keep your urine clear or pale yellow.  If you cannot eat, alternate between drinking fluids with sugar (soda, juices, flavored gelatin) and salty fluids (broth, bouillon).  If you can eat, follow your usual diet and drink sugar-free liquids (water, diet drinks).  Always take your usual dose of insulin. If you cannot eat or if your glucose is getting too low, call your health care provider for further instructions.  Continue to monitor your blood or urine ketones every 3-4 hours around the clock. Set your alarm clock or have someone wake you up. If you are too sick, have someone test it for you.  Rest and avoid exercise. SEEK MEDICAL CARE IF:   You have a fever.  You have ketones in your urine, or your blood glucose is higher than a level your health care provider suggests. You may need extra insulin. Call your health care provider if you need advice on adjusting your insulin.  You cannot drink at least a tablespoon (15 mL) of fluid every 15-20 minutes.  You have been vomiting for more than 2 hours.  You have symptoms of DKA:  Fruity smelling breath.  Breathing faster or slower.  Becoming very sleepy. SEEK IMMEDIATE MEDICAL CARE IF:   You have signs of dehydration:  Decreased urination.  Increased thirst.  Dry skin and mouth.  Light-headedness.  Your blood glucose is very high (as advised by your health care provider) twice in a row.  You faint.  You have chest pain or trouble breathing.  You have a sudden, severe headache.  You have sudden weakness in one arm or one leg.  You have sudden trouble speaking or swallowing.  You have vomiting or diarrhea that is getting worse after 3 hours.  You have abdominal pain. MAKE SURE YOU:   Understand these instructions.  Will watch your condition.  Will get help right away if you are not doing well or get worse. Document Released: 04/19/2000 Document Revised:  04/27/2013 Document Reviewed: 10/26/2008 [facility] Patient Information 2015 [place], [place]. This information is not intended to replace advice given to you by your health care provider.  Make sure you discuss any questions you have with your health care provider.  Diabetes and Exercise Exercising regularly is important. It is not just about losing weight. It has many health benefits, such as:  Improving your overall fitness, flexibility, and endurance.  Increasing your bone density.  Helping with weight control.  Decreasing your body fat.  Increasing your muscle strength.  Reducing stress and tension.  Improving your overall health. People with diabetes who exercise gain additional benefits because exercise:  Reduces appetite.  Improves the body's use of blood sugar (glucose).  Helps lower or control blood glucose.  Decreases blood pressure.  Helps control blood lipids (such as cholesterol and triglycerides).  Improves the body's use of the hormone insulin by:  Increasing the body's insulin sensitivity.  Reducing the body's insulin needs.  Decreases the risk for heart disease because exercising:  Lowers cholesterol and triglycerides levels.  Increases the levels of good cholesterol (such as high-density lipoproteins [HDL]) in the body.  Lowers blood glucose levels. YOUR ACTIVITY PLAN  Choose an activity that you enjoy and set realistic goals. Your health care provider or diabetes educator can help you make an activity plan that works for you. Exercise regularly as directed by your health care provider. This includes:  Performing resistance training twice a week such as push-ups, sit-ups, lifting weights, or using resistance bands.  Performing 150 minutes of cardio exercises each week such as walking, running, or playing sports.  Staying active and spending no more than 90 minutes at one time being inactive. Even short bursts of exercise are good for you. Three  10-minute sessions spread throughout the day are just as beneficial as a single 30-minute session. Some exercise ideas include:  Taking the dog for a walk.  Taking the stairs instead of the elevator.  Dancing to your favorite song.  Doing an exercise video.  Doing your favorite exercise with a friend. RECOMMENDATIONS FOR EXERCISING WITH TYPE 1 OR TYPE 2 DIABETES   Check your blood glucose before exercising. If blood glucose levels are greater than 240 mg/dL, check for urine ketones. Do not exercise if ketones are present.  Avoid injecting insulin into areas of the body that are going to be exercised. For example, avoid injecting insulin into:  The arms when playing tennis.  The legs when jogging.  Keep a record of:  Food intake before and after you exercise.  Expected peak times of insulin action.  Blood glucose levels before and after you exercise.  The type and amount of exercise you have done.  Review your records with your health care provider. Your health care provider will help you to develop guidelines for adjusting food intake and insulin amounts before and after exercising.  If you take insulin or oral hypoglycemic agents, watch for signs and symptoms of hypoglycemia. They include:  Dizziness.  Shaking.  Sweating.  Chills.  Confusion.  Drink plenty of water while you exercise to prevent dehydration or heat stroke. Body water is lost during exercise and must be replaced.  Talk to your health care provider before starting an exercise program to make sure it is safe for you. Remember, almost any type of activity is better than none. Document Released: 07/13/2003 Document Revised: 09/06/2013 Document Reviewed: 09/29/2012 [facility] Patient Information 2015 [place], [place]. This information is not intended to replace advice given to you by your health care provider. Make sure you discuss any questions you have with your health care provider.  Diabetes and Sick Day  Management  Blood sugar (glucose) can be more difficult to control when you are sick. Colds, fever, flu, nausea, vomiting, and diarrhea are all examples of common illnesses that can cause problems for people with diabetes. Loss of body fluids (dehydration) from fever, vomiting, diarrhea, infection, and the stress of a sickness can all cause blood glucose levels to increase. Because of this, it is very important to take your diabetes medicines and to eat some form of carbohydrate food when you are sick. Liquid or soft foods are often tolerated, and they help to replace fluids. HOME CARE INSTRUCTIONS These main guidelines are intended for managing a short-term (24 hours or less) sickness:  Take your usual dose of insulin or oral diabetes medicine. An exception would be if you take any form of metformin. If you cannot eat or drink, you can become dehydrated and should not take this medicine.  Continue to take your insulin even if you are unable to eat solid foods or are vomiting. Your insulin dose may stay the same, or it may need to be increased when you are sick.  You will need to test your blood glucose more often, generally every 2-4 hours. If you have type 1 diabetes, test your urine for ketones every 4 hours. If you have type 2 diabetes, test your urine for ketones as directed by your health care provider.  Eat some form of food that contains carbohydrates. The carbohydrates can be in solid or liquid form. You should eat 45-50 g of carbohydrates every 3-4 hours.  Replace fluids if you have a fever, vomit, or have diarrhea. Ask your health care provider for specific rehydration instructions.  Watch carefully for the signs of ketoacidosis if you have type 1 diabetes. Call your health care provider if any of the following symptoms are present, especially in children:  Moderate to large ketones in the urine along with a high blood glucose level.  Severe nausea.  Vomiting.  Diarrhea.  Abdominal  pain.  Rapid breathing.  Drink extra liquids that do not contain sugar such as water.  Be careful with over-the-counter medicines. Read the labels. They may contain sugar or types of sugars that can increase your blood glucose level. Food Choices for Illness All of the food choices below contain about 15 g of carbohydrates. Plan ahead and keep some of these foods around.    to  cup carbonated beverage containing sugar. Carbonated beverages will usually be better tolerated if they are opened and left at room temperature for a few minutes.   of a twin frozen ice pop.   cup regular gelatin.   cup juice.   cup ice cream or frozen yogurt.   cup cooked cereal.   cup sherbet.  1 cup clear broth or soup.  1 cup cream soup.   cup regular custard.   cup regular pudding.  1 cup sports drink.  1 cup plain yogurt.  1 slice toast.  6 squares saltine crackers.  5 vanilla wafers. SEEK MEDICAL CARE IF:   You are unable to drink fluids, even small amounts.  You have nausea and vomiting for more than 6 hours.  You have diarrhea for more than 6 hours.  Your blood glucose level is more than 240 mg/dL, even with additional insulin.  There is a change in mental status.  You develop an additional serious sickness.  You have been sick for 2 days and are not getting better.  You have a fever. SEEK IMMEDIATE MEDICAL CARE IF:  You  have difficulty breathing.  You have moderate to large ketone levels. MAKE SURE YOU:  Understand these instructions.  Will watch your condition.  Will get help right away if you are not doing well or get worse. Document Released: 04/25/2003 Document Revised: 09/06/2013 Document Reviewed: 09/29/2012 [facility] Patient Information 2015 [place], [place]. This information is not intended to replace advice given to you by your health care provider. Make sure you discuss any questions you have with your health care provider.

## 2014-07-13 ENCOUNTER — Encounter: Payer: Self-pay | Admitting: Obstetrics & Gynecology

## 2014-07-13 ENCOUNTER — Ambulatory Visit (INDEPENDENT_AMBULATORY_CARE_PROVIDER_SITE_OTHER): Payer: 59 | Admitting: Obstetrics & Gynecology

## 2014-07-13 VITALS — BP 123/77 | HR 93 | Resp 16 | Ht 67.0 in | Wt 255.0 lb

## 2014-07-13 DIAGNOSIS — Z6839 Body mass index (BMI) 39.0-39.9, adult: Secondary | ICD-10-CM

## 2014-07-13 DIAGNOSIS — R87810 Cervical high risk human papillomavirus (HPV) DNA test positive: Secondary | ICD-10-CM

## 2014-07-13 DIAGNOSIS — Z1151 Encounter for screening for human papillomavirus (HPV): Secondary | ICD-10-CM

## 2014-07-13 DIAGNOSIS — Z01419 Encounter for gynecological examination (general) (routine) without abnormal findings: Secondary | ICD-10-CM

## 2014-07-13 DIAGNOSIS — Z124 Encounter for screening for malignant neoplasm of cervix: Secondary | ICD-10-CM

## 2014-07-13 DIAGNOSIS — Z30431 Encounter for routine checking of intrauterine contraceptive device: Secondary | ICD-10-CM

## 2014-07-13 LAB — MRSA CULTURE

## 2014-07-13 NOTE — Progress Notes (Signed)
    GYNECOLOGY CLINIC ANNUAL PREVENTATIVE CARE ENCOUNTER NOTE  Subjective:     Tammy Roman is a 34 y.o. [id number] female here for a routine annual gynecologic exam.  Current complaints: none.  Light regular menstrual periods on IUD. No problems   Gynecologic History Patient's last menstrual period was 06/12/2014. Contraception: Mirena IUD placed in 07/2012 Last Pap: 07/07/2013. Results were: normal with negative HRHPV  Obstetric History OB History  Gravida Para Term Preterm AB SAB TAB Ectopic Multiple Living  2 2 2       2     # Outcome Date GA Lbr Len/2nd Weight Sex Delivery Anes PTL Lv  2 Term 05/23/12 141w4d 05:15 / 00:27 8 lb 9 oz (3.884 kg) M Vag-Spont EPI  Y  1 Term 01/22/03 4822w0d  7 lb 14 oz (3.572 kg) F Vag-Spont         History   Social History  . Marital Status: Married    Spouse Name: Kandee KeenCory  . Number of Children: 1   . Years of Education: N/A   Occupational History  . CUSTOMER SERVICE    Social History Main Topics  . Smoking status: Never Smoker   . Smokeless tobacco: Never Used  . Alcohol Use: No     Comment: occasional  . Drug Use: No  . Sexual Activity:    Partners: Male     Comment: works for [employer], married, finishing [employer] school, married,2 kids, no exercise.   Other Topics Concern  . Not on file   Social History Narrative   Some regular exercise.  No caffeine daily.    The following portions of the patient's history were reviewed and updated as appropriate: allergies, current medications, past family history, past medical history, past social history, past surgical history and problem list.  Review of Systems A comprehensive review of systems was negative.   Objective:   BP 123/77 mmHg  Pulse 93  Resp 16  Ht 5\' 7"  (1.702 m)  Wt 255 lb (115.667 kg)  BMI 39.93 kg/m2  LMP 06/12/2014 GENERAL: Well-developed female in no acute distress.  HEENT: Normocephalic, atraumatic. Sclerae anicteric.  NECK: Supple. Normal thyroid.  LUNGS:  Clear to auscultation bilaterally.  HEART: Regular rate and rhythm.  BREASTS: Symmetric in size. No masses, skin changes, nipple drainage, or lymphadenopathy.  ABDOMEN: Soft, obese, nontender, nondistended. No organomegaly palpated. PELVIC: Normal external female genitalia. Vagina is pink and rugated. Normal discharge. Normal cervix contour. IUD strings not seen (was cut last year as per patient preference).  Pap smear obtained. Unable to palpate uterus or adnexa secondary to habitus  EXTREMITIES: No cyanosis, clubbing, or edema, 2+ distal pulses.   Assessment:   Annual gynecologic examination   Plan:   Pap done, will follow up results and manage accordingly. Routine preventative health maintenance measures emphasized   Jaynie CollinsUGONNA  Klair Leising, MD, FACOG Attending Obstetrician & Gynecologist [facility], [facility]

## 2014-07-13 NOTE — Patient Instructions (Signed)
Preventive Care for Adults A healthy lifestyle and preventive care can promote health and wellness. Preventive health guidelines for women include the following key practices.  A routine yearly physical is a good way to check with your health care provider about your health and preventive screening. It is a chance to share any concerns and updates on your health and to receive a thorough exam.  Visit your dentist for a routine exam and preventive care every 6 months. Brush your teeth twice a day and floss once a day. Good oral hygiene prevents tooth decay and gum disease.  The frequency of eye exams is based on your age, health, family medical history, use of contact lenses, and other factors. Follow your health care provider's recommendations for frequency of eye exams.  Eat a healthy diet. Foods like vegetables, fruits, whole grains, low-fat dairy products, and lean protein foods contain the nutrients you need without too many calories. Decrease your intake of foods high in solid fats, added sugars, and salt. Eat the right amount of calories for you.Get information about a proper diet from your health care provider, if necessary.  Regular physical exercise is one of the most important things you can do for your health. Most adults should get at least 150 minutes of moderate-intensity exercise (any activity that increases your heart rate and causes you to sweat) each week. In addition, most adults need muscle-strengthening exercises on 2 or more days a week.  Maintain a healthy weight. The body mass index (BMI) is a screening tool to identify possible weight problems. It provides an estimate of body fat based on height and weight. Your health care provider can find your BMI and can help you achieve or maintain a healthy weight.For adults 20 years and older:  A BMI below 18.5 is considered underweight.  A BMI of 18.5 to 24.9 is normal.  A BMI of 25 to 29.9 is considered overweight.  A BMI of  30 and above is considered obese.  Maintain normal blood lipids and cholesterol levels by exercising and minimizing your intake of saturated fat. Eat a balanced diet with plenty of fruit and vegetables. Blood tests for lipids and cholesterol should begin at age 76 and be repeated every 5 years. If your lipid or cholesterol levels are high, you are over 50, or you are at high risk for heart disease, you may need your cholesterol levels checked more frequently.Ongoing high lipid and cholesterol levels should be treated with medicines if diet and exercise are not working.  If you smoke, find out from your health care provider how to quit. If you do not use tobacco, do not start.  Lung cancer screening is recommended for adults aged 22-80 years who are at high risk for developing lung cancer because of a history of smoking. A yearly low-dose CT scan of the lungs is recommended for people who have at least a 30-pack-year history of smoking and are a current smoker or have quit within the past 15 years. A pack year of smoking is smoking an average of 1 pack of cigarettes a day for 1 year (for example: 1 pack a day for 30 years or 2 packs a day for 15 years). Yearly screening should continue until the smoker has stopped smoking for at least 15 years. Yearly screening should be stopped for people who develop a health problem that would prevent them from having lung cancer treatment.  If you are pregnant, do not drink alcohol. If you are breastfeeding,  be very cautious about drinking alcohol. If you are not pregnant and choose to drink alcohol, do not have more than 1 drink per day. One drink is considered to be 12 ounces (355 mL) of beer, 5 ounces (148 mL) of wine, or 1.5 ounces (44 mL) of liquor.  Avoid use of street drugs. Do not share needles with anyone. Ask for help if you need support or instructions about stopping the use of drugs.  High blood pressure causes heart disease and increases the risk of  stroke. Your blood pressure should be checked at least every 1 to 2 years. Ongoing high blood pressure should be treated with medicines if weight loss and exercise do not work.  If you are 75-52 years old, ask your health care provider if you should take aspirin to prevent strokes.  Diabetes screening involves taking a blood sample to check your fasting blood sugar level. This should be done once every 3 years, after age 15, if you are within normal weight and without risk factors for diabetes. Testing should be considered at a younger age or be carried out more frequently if you are overweight and have at least 1 risk factor for diabetes.  Breast cancer screening is essential preventive care for women. You should practice "breast self-awareness." This means understanding the normal appearance and feel of your breasts and may include breast self-examination. Any changes detected, no matter how small, should be reported to a health care provider. Women in their 58s and 30s should have a clinical breast exam (CBE) by a health care provider as part of a regular health exam every 1 to 3 years. After age 16, women should have a CBE every year. Starting at age 53, women should consider having a mammogram (breast X-ray test) every year. Women who have a family history of breast cancer should talk to their health care provider about genetic screening. Women at a high risk of breast cancer should talk to their health care providers about having an MRI and a mammogram every year.  Breast cancer gene (BRCA)-related cancer risk assessment is recommended for women who have family members with BRCA-related cancers. BRCA-related cancers include breast, ovarian, tubal, and peritoneal cancers. Having family members with these cancers may be associated with an increased risk for harmful changes (mutations) in the breast cancer genes BRCA1 and BRCA2. Results of the assessment will determine the need for genetic counseling and  BRCA1 and BRCA2 testing.  Routine pelvic exams to screen for cancer are no longer recommended for nonpregnant women who are considered low risk for cancer of the pelvic organs (ovaries, uterus, and vagina) and who do not have symptoms. Ask your health care provider if a screening pelvic exam is right for you.  If you have had past treatment for cervical cancer or a condition that could lead to cancer, you need Pap tests and screening for cancer for at least 20 years after your treatment. If Pap tests have been discontinued, your risk factors (such as having a new sexual partner) need to be reassessed to determine if screening should be resumed. Some women have medical problems that increase the chance of getting cervical cancer. In these cases, your health care provider may recommend more frequent screening and Pap tests.  The HPV test is an additional test that may be used for cervical cancer screening. The HPV test looks for the virus that can cause the cell changes on the cervix. The cells collected during the Pap test can be  tested for HPV. The HPV test could be used to screen women aged 30 years and older, and should be used in women of any age who have unclear Pap test results. After the age of 30, women should have HPV testing at the same frequency as a Pap test.  Colorectal cancer can be detected and often prevented. Most routine colorectal cancer screening begins at the age of 50 years and continues through age 75 years. However, your health care provider may recommend screening at an earlier age if you have risk factors for colon cancer. On a yearly basis, your health care provider may provide home test kits to check for hidden blood in the stool. Use of a small camera at the end of a tube, to directly examine the colon (sigmoidoscopy or colonoscopy), can detect the earliest forms of colorectal cancer. Talk to your health care provider about this at age 50, when routine screening begins. Direct  exam of the colon should be repeated every 5-10 years through age 75 years, unless early forms of pre-cancerous polyps or small growths are found.  People who are at an increased risk for hepatitis B should be screened for this virus. You are considered at high risk for hepatitis B if:  You were born in a country where hepatitis B occurs often. Talk with your health care provider about which countries are considered high risk.  Your parents were born in a high-risk country and you have not received a shot to protect against hepatitis B (hepatitis B vaccine).  You have HIV or AIDS.  You use needles to inject street drugs.  You live with, or have sex with, someone who has hepatitis B.  You get hemodialysis treatment.  You take certain medicines for conditions like cancer, organ transplantation, and autoimmune conditions.  Hepatitis C blood testing is recommended for all people born from 1945 through 1965 and any individual with known risks for hepatitis C.  Practice safe sex. Use condoms and avoid high-risk sexual practices to reduce the spread of sexually transmitted infections (STIs). STIs include gonorrhea, chlamydia, syphilis, trichomonas, herpes, HPV, and human immunodeficiency virus (HIV). Herpes, HIV, and HPV are viral illnesses that have no cure. They can result in disability, cancer, and death.  You should be screened for sexually transmitted illnesses (STIs) including gonorrhea and chlamydia if:  You are sexually active and are younger than 24 years.  You are older than 24 years and your health care provider tells you that you are at risk for this type of infection.  Your sexual activity has changed since you were last screened and you are at an increased risk for chlamydia or gonorrhea. Ask your health care provider if you are at risk.  If you are at risk of being infected with HIV, it is recommended that you take a prescription medicine daily to prevent HIV infection. This is  called preexposure prophylaxis (PrEP). You are considered at risk if:  You are a heterosexual woman, are sexually active, and are at increased risk for HIV infection.  You take drugs by injection.  You are sexually active with a partner who has HIV.  Talk with your health care provider about whether you are at high risk of being infected with HIV. If you choose to begin PrEP, you should first be tested for HIV. You should then be tested every 3 months for as long as you are taking PrEP.  Osteoporosis is a disease in which the bones lose minerals and strength   with aging. This can result in serious bone fractures or breaks. The risk of osteoporosis can be identified using a bone density scan. Women ages 65 years and over and women at risk for fractures or osteoporosis should discuss screening with their health care providers. Ask your health care provider whether you should take a calcium supplement or vitamin D to reduce the rate of osteoporosis.  Menopause can be associated with physical symptoms and risks. Hormone replacement therapy is available to decrease symptoms and risks. You should talk to your health care provider about whether hormone replacement therapy is right for you.  Use sunscreen. Apply sunscreen liberally and repeatedly throughout the day. You should seek shade when your shadow is shorter than you. Protect yourself by wearing long sleeves, pants, a wide-brimmed hat, and sunglasses year round, whenever you are outdoors.  Once a month, do a whole body skin exam, using a mirror to look at the skin on your back. Tell your health care provider of new moles, moles that have irregular borders, moles that are larger than a pencil eraser, or moles that have changed in shape or color.  Stay current with required vaccines (immunizations).  Influenza vaccine. All adults should be immunized every year.  Tetanus, diphtheria, and acellular pertussis (Td, Tdap) vaccine. Pregnant women should  receive 1 dose of Tdap vaccine during each pregnancy. The dose should be obtained regardless of the length of time since the last dose. Immunization is preferred during the 27th-36th week of gestation. An adult who has not previously received Tdap or who does not know her vaccine status should receive 1 dose of Tdap. This initial dose should be followed by tetanus and diphtheria toxoids (Td) booster doses every 10 years. Adults with an unknown or incomplete history of completing a 3-dose immunization series with Td-containing vaccines should begin or complete a primary immunization series including a Tdap dose. Adults should receive a Td booster every 10 years.  Varicella vaccine. An adult without evidence of immunity to varicella should receive 2 doses or a second dose if she has previously received 1 dose. Pregnant females who do not have evidence of immunity should receive the first dose after pregnancy. This first dose should be obtained before leaving the health care facility. The second dose should be obtained 4-8 weeks after the first dose.  Human papillomavirus (HPV) vaccine. Females aged 13-26 years who have not received the vaccine previously should obtain the 3-dose series. The vaccine is not recommended for use in pregnant females. However, pregnancy testing is not needed before receiving a dose. If a female is found to be pregnant after receiving a dose, no treatment is needed. In that case, the remaining doses should be delayed until after the pregnancy. Immunization is recommended for any person with an immunocompromised condition through the age of 26 years if she did not get any or all doses earlier. During the 3-dose series, the second dose should be obtained 4-8 weeks after the first dose. The third dose should be obtained 24 weeks after the first dose and 16 weeks after the second dose.  Zoster vaccine. One dose is recommended for adults aged 60 years or older unless certain conditions are  present.  Measles, mumps, and rubella (MMR) vaccine. Adults born before 1957 generally are considered immune to measles and mumps. Adults born in 1957 or later should have 1 or more doses of MMR vaccine unless there is a contraindication to the vaccine or there is laboratory evidence of immunity to   each of the three diseases. A routine second dose of MMR vaccine should be obtained at least 28 days after the first dose for students attending postsecondary schools, health care workers, or international travelers. People who received inactivated measles vaccine or an unknown type of measles vaccine during 1963-1967 should receive 2 doses of MMR vaccine. People who received inactivated mumps vaccine or an unknown type of mumps vaccine before 1979 and are at high risk for mumps infection should consider immunization with 2 doses of MMR vaccine. For females of childbearing age, rubella immunity should be determined. If there is no evidence of immunity, females who are not pregnant should be vaccinated. If there is no evidence of immunity, females who are pregnant should delay immunization until after pregnancy. Unvaccinated health care workers born before 1957 who lack laboratory evidence of measles, mumps, or rubella immunity or laboratory confirmation of disease should consider measles and mumps immunization with 2 doses of MMR vaccine or rubella immunization with 1 dose of MMR vaccine.  Pneumococcal 13-valent conjugate (PCV13) vaccine. When indicated, a person who is uncertain of her immunization history and has no record of immunization should receive the PCV13 vaccine. An adult aged 19 years or older who has certain medical conditions and has not been previously immunized should receive 1 dose of PCV13 vaccine. This PCV13 should be followed with a dose of pneumococcal polysaccharide (PPSV23) vaccine. The PPSV23 vaccine dose should be obtained at least 8 weeks after the dose of PCV13 vaccine. An adult aged 19  years or older who has certain medical conditions and previously received 1 or more doses of PPSV23 vaccine should receive 1 dose of PCV13. The PCV13 vaccine dose should be obtained 1 or more years after the last PPSV23 vaccine dose.  Pneumococcal polysaccharide (PPSV23) vaccine. When PCV13 is also indicated, PCV13 should be obtained first. All adults aged 65 years and older should be immunized. An adult younger than age 65 years who has certain medical conditions should be immunized. Any person who resides in a nursing home or long-term care facility should be immunized. An adult smoker should be immunized. People with an immunocompromised condition and certain other conditions should receive both PCV13 and PPSV23 vaccines. People with human immunodeficiency virus (HIV) infection should be immunized as soon as possible after diagnosis. Immunization during chemotherapy or radiation therapy should be avoided. Routine use of PPSV23 vaccine is not recommended for American Indians, [place] Natives, or people younger than 65 years unless there are medical conditions that require PPSV23 vaccine. When indicated, people who have unknown immunization and have no record of immunization should receive PPSV23 vaccine. One-time revaccination 5 years after the first dose of PPSV23 is recommended for people aged 19-64 years who have chronic kidney failure, nephrotic syndrome, asplenia, or immunocompromised conditions. People who received 1-2 doses of PPSV23 before age 65 years should receive another dose of PPSV23 vaccine at age 65 years or later if at least 5 years have passed since the previous dose. Doses of PPSV23 are not needed for people immunized with PPSV23 at or after age 65 years.  Meningococcal vaccine. Adults with asplenia or persistent complement component deficiencies should receive 2 doses of quadrivalent meningococcal conjugate (MenACWY-D) vaccine. The doses should be obtained at least 2 months apart.  Microbiologists working with certain meningococcal bacteria, military recruits, people at risk during an outbreak, and people who travel to or live in countries with a high rate of meningitis should be immunized. A first-year college student up through age   21 years who is living in a residence hall should receive a dose if she did not receive a dose on or after her 16th birthday. Adults who have certain high-risk conditions should receive one or more doses of vaccine.  Hepatitis A vaccine. Adults who wish to be protected from this disease, have certain high-risk conditions, work with hepatitis A-infected animals, work in hepatitis A research labs, or travel to or work in countries with a high rate of hepatitis A should be immunized. Adults who were previously unvaccinated and who anticipate close contact with an international adoptee during the first 60 days after arrival in the [place] from a country with a high rate of hepatitis A should be immunized.  Hepatitis B vaccine. Adults who wish to be protected from this disease, have certain high-risk conditions, may be exposed to blood or other infectious body fluids, are household contacts or sex partners of hepatitis B positive people, are clients or workers in certain care facilities, or travel to or work in countries with a high rate of hepatitis B should be immunized.  Haemophilus influenzae type b (Hib) vaccine. A previously unvaccinated person with asplenia or sickle cell disease or having a scheduled splenectomy should receive 1 dose of Hib vaccine. Regardless of previous immunization, a recipient of a hematopoietic stem cell transplant should receive a 3-dose series 6-12 months after her successful transplant. Hib vaccine is not recommended for adults with HIV infection. Preventive Services / Frequency Ages 64 to 68 years  Blood pressure check.** / Every 1 to 2 years.  Lipid and cholesterol check.** / Every 5 years beginning at age  22.  Clinical breast exam.** / Every 3 years for women in their 88s and 53s.  BRCA-related cancer risk assessment.** / For women who have family members with a BRCA-related cancer (breast, ovarian, tubal, or peritoneal cancers).  Pap test.** / Every 2 years from ages 90 through 51. Every 3 years starting at age 21 through age 56 or 3 with a history of 3 consecutive normal Pap tests.  HPV screening.** / Every 3 years from ages 24 through ages 1 to 46 with a history of 3 consecutive normal Pap tests.  Hepatitis C blood test.** / For any individual with known risks for hepatitis C.  Skin self-exam. / Monthly.  Influenza vaccine. / Every year.  Tetanus, diphtheria, and acellular pertussis (Tdap, Td) vaccine.** / Consult your health care provider. Pregnant women should receive 1 dose of Tdap vaccine during each pregnancy. 1 dose of Td every 10 years.  Varicella vaccine.** / Consult your health care provider. Pregnant females who do not have evidence of immunity should receive the first dose after pregnancy.  HPV vaccine. / 3 doses over 6 months, if 72 and younger. The vaccine is not recommended for use in pregnant females. However, pregnancy testing is not needed before receiving a dose.  Measles, mumps, rubella (MMR) vaccine.** / You need at least 1 dose of MMR if you were born in 1957 or later. You may also need a 2nd dose. For females of childbearing age, rubella immunity should be determined. If there is no evidence of immunity, females who are not pregnant should be vaccinated. If there is no evidence of immunity, females who are pregnant should delay immunization until after pregnancy.  Pneumococcal 13-valent conjugate (PCV13) vaccine.** / Consult your health care provider.  Pneumococcal polysaccharide (PPSV23) vaccine.** / 1 to 2 doses if you smoke cigarettes or if you have certain conditions.  Meningococcal vaccine.** /  1 dose if you are age 19 to 21 years and a first-year college  student living in a residence hall, or have one of several medical conditions, you need to get vaccinated against meningococcal disease. You may also need additional booster doses.  Hepatitis A vaccine.** / Consult your health care provider.  Hepatitis B vaccine.** / Consult your health care provider.  Haemophilus influenzae type b (Hib) vaccine.** / Consult your health care provider. Ages 40 to 64 years  Blood pressure check.** / Every 1 to 2 years.  Lipid and cholesterol check.** / Every 5 years beginning at age 20 years.  Lung cancer screening. / Every year if you are aged 55-80 years and have a 30-pack-year history of smoking and currently smoke or have quit within the past 15 years. Yearly screening is stopped once you have quit smoking for at least 15 years or develop a health problem that would prevent you from having lung cancer treatment.  Clinical breast exam.** / Every year after age 40 years.  BRCA-related cancer risk assessment.** / For women who have family members with a BRCA-related cancer (breast, ovarian, tubal, or peritoneal cancers).  Mammogram.** / Every year beginning at age 40 years and continuing for as long as you are in good health. Consult with your health care provider.  Pap test.** / Every 3 years starting at age 30 years through age 65 or 70 years with a history of 3 consecutive normal Pap tests.  HPV screening.** / Every 3 years from ages 30 years through ages 65 to 70 years with a history of 3 consecutive normal Pap tests.  Fecal occult blood test (FOBT) of stool. / Every year beginning at age 50 years and continuing until age 75 years. You may not need to do this test if you get a colonoscopy every 10 years.  Flexible sigmoidoscopy or colonoscopy.** / Every 5 years for a flexible sigmoidoscopy or every 10 years for a colonoscopy beginning at age 50 years and continuing until age 75 years.  Hepatitis C blood test.** / For all people born from 1945 through  1965 and any individual with known risks for hepatitis C.  Skin self-exam. / Monthly.  Influenza vaccine. / Every year.  Tetanus, diphtheria, and acellular pertussis (Tdap/Td) vaccine.** / Consult your health care provider. Pregnant women should receive 1 dose of Tdap vaccine during each pregnancy. 1 dose of Td every 10 years.  Varicella vaccine.** / Consult your health care provider. Pregnant females who do not have evidence of immunity should receive the first dose after pregnancy.  Zoster vaccine.** / 1 dose for adults aged 60 years or older.  Measles, mumps, rubella (MMR) vaccine.** / You need at least 1 dose of MMR if you were born in 1957 or later. You may also need a 2nd dose. For females of childbearing age, rubella immunity should be determined. If there is no evidence of immunity, females who are not pregnant should be vaccinated. If there is no evidence of immunity, females who are pregnant should delay immunization until after pregnancy.  Pneumococcal 13-valent conjugate (PCV13) vaccine.** / Consult your health care provider.  Pneumococcal polysaccharide (PPSV23) vaccine.** / 1 to 2 doses if you smoke cigarettes or if you have certain conditions.  Meningococcal vaccine.** / Consult your health care provider.  Hepatitis A vaccine.** / Consult your health care provider.  Hepatitis B vaccine.** / Consult your health care provider.  Haemophilus influenzae type b (Hib) vaccine.** / Consult your health care provider. Ages 65   years and over  Blood pressure check.** / Every 1 to 2 years.  Lipid and cholesterol check.** / Every 5 years beginning at age 22 years.  Lung cancer screening. / Every year if you are aged 73-80 years and have a 30-pack-year history of smoking and currently smoke or have quit within the past 15 years. Yearly screening is stopped once you have quit smoking for at least 15 years or develop a health problem that would prevent you from having lung cancer  treatment.  Clinical breast exam.** / Every year after age 4 years.  BRCA-related cancer risk assessment.** / For women who have family members with a BRCA-related cancer (breast, ovarian, tubal, or peritoneal cancers).  Mammogram.** / Every year beginning at age 40 years and continuing for as long as you are in good health. Consult with your health care provider.  Pap test.** / Every 3 years starting at age 9 years through age 34 or 91 years with 3 consecutive normal Pap tests. Testing can be stopped between 65 and 70 years with 3 consecutive normal Pap tests and no abnormal Pap or HPV tests in the past 10 years.  HPV screening.** / Every 3 years from ages 57 years through ages 64 or 45 years with a history of 3 consecutive normal Pap tests. Testing can be stopped between 65 and 70 years with 3 consecutive normal Pap tests and no abnormal Pap or HPV tests in the past 10 years.  Fecal occult blood test (FOBT) of stool. / Every year beginning at age 15 years and continuing until age 17 years. You may not need to do this test if you get a colonoscopy every 10 years.  Flexible sigmoidoscopy or colonoscopy.** / Every 5 years for a flexible sigmoidoscopy or every 10 years for a colonoscopy beginning at age 86 years and continuing until age 71 years.  Hepatitis C blood test.** / For all people born from [birth year] through 1964 and any individual with known risks for hepatitis C.  Osteoporosis screening.** / A one-time screening for women ages 83 years and over and women at risk for fractures or osteoporosis.  Skin self-exam. / Monthly.  Influenza vaccine. / Every year.  Tetanus, diphtheria, and acellular pertussis (Tdap/Td) vaccine.** / 1 dose of Td every 10 years.  Varicella vaccine.** / Consult your health care provider.  Zoster vaccine.** / 1 dose for adults aged 61 years or older.  Pneumococcal 13-valent conjugate (PCV13) vaccine.** / Consult your health care provider.  Pneumococcal  polysaccharide (PPSV23) vaccine.** / 1 dose for all adults aged 28 years and older.  Meningococcal vaccine.** / Consult your health care provider.  Hepatitis A vaccine.** / Consult your health care provider.  Hepatitis B vaccine.** / Consult your health care provider.  Haemophilus influenzae type b (Hib) vaccine.** / Consult your health care provider. ** Family history and personal history of risk and conditions may change your health care provider's recommendations. Document Released: 06/18/2001 Document Revised: 09/06/2013 Document Reviewed: 09/17/2010 [facility] Patient Information 2015 [place], [place]. This information is not intended to replace advice given to you by your health care provider. Make sure you discuss any questions you have with your health care provider.

## 2014-07-14 LAB — CBC WITH DIFFERENTIAL/PLATELET
BASOS ABS: 0 10*3/uL (ref 0.0–0.1)
Basophils Relative: 0 % (ref 0–1)
Eosinophils Absolute: 0.1 10*3/uL (ref 0.0–0.7)
Eosinophils Relative: 1 % (ref 0–5)
HCT: 42.8 % (ref 36.0–46.0)
HEMOGLOBIN: 13.6 g/dL (ref 12.0–15.0)
LYMPHS ABS: 2.8 10*3/uL (ref 0.7–4.0)
Lymphocytes Relative: 43 % (ref 12–46)
MCH: 26.5 pg (ref 26.0–34.0)
MCHC: 31.8 g/dL (ref 30.0–36.0)
MCV: 83.3 fL (ref 78.0–100.0)
MONOS PCT: 11 % (ref 3–12)
MPV: 10.9 fL (ref 8.6–12.4)
Monocytes Absolute: 0.7 10*3/uL (ref 0.1–1.0)
NEUTROS PCT: 45 % (ref 43–77)
Neutro Abs: 2.9 10*3/uL (ref 1.7–7.7)
Platelets: 261 10*3/uL (ref 150–400)
RBC: 5.14 MIL/uL — AB (ref 3.87–5.11)
RDW: 14.8 % (ref 11.5–15.5)
WBC: 6.5 10*3/uL (ref 4.0–10.5)

## 2014-07-14 LAB — COMPLETE METABOLIC PANEL WITH GFR
ALBUMIN: 3.7 g/dL (ref 3.5–5.2)
ALT: 80 U/L — ABNORMAL HIGH (ref 0–35)
AST: 92 U/L — ABNORMAL HIGH (ref 0–37)
Alkaline Phosphatase: 121 U/L — ABNORMAL HIGH (ref 39–117)
BUN: 8 mg/dL (ref 6–23)
CHLORIDE: 96 meq/L (ref 96–112)
CO2: 19 meq/L (ref 19–32)
CREATININE: 0.82 mg/dL (ref 0.50–1.10)
Calcium: 8.5 mg/dL (ref 8.4–10.5)
GFR, Est African American: >89 mL/min
GFR, Est Non African American: >89 mL/min
Glucose, Bld: 284 mg/dL — ABNORMAL HIGH (ref 70–99)
Potassium: 3.5 mEq/L (ref 3.5–5.3)
Sodium: 132 mEq/L — ABNORMAL LOW (ref 135–145)
Total Bilirubin: 0.6 mg/dL (ref 0.2–1.2)
Total Protein: 6 g/dL (ref 6.0–8.3)

## 2014-07-14 LAB — LIPID PANEL
CHOLESTEROL: 144 mg/dL (ref 0–200)
HDL: 39 mg/dL — ABNORMAL LOW (ref >=46)
LDL Cholesterol: 84 mg/dL (ref 0–99)
Total CHOL/HDL Ratio: 3.7 Ratio
Triglycerides: 105 mg/dL (ref <150)
VLDL: 21 mg/dL (ref 0–40)

## 2014-07-14 LAB — TSH: TSH: 1.508 u[IU]/mL (ref 0.350–4.500)

## 2014-07-15 ENCOUNTER — Ambulatory Visit (INDEPENDENT_AMBULATORY_CARE_PROVIDER_SITE_OTHER): Payer: 59 | Admitting: Family Medicine

## 2014-07-15 ENCOUNTER — Encounter: Payer: Self-pay | Admitting: Family Medicine

## 2014-07-15 VITALS — BP 143/85 | HR 98 | Wt 261.0 lb

## 2014-07-15 DIAGNOSIS — E1165 Type 2 diabetes mellitus with hyperglycemia: Secondary | ICD-10-CM

## 2014-07-15 DIAGNOSIS — IMO0002 Reserved for concepts with insufficient information to code with codable children: Secondary | ICD-10-CM

## 2014-07-15 DIAGNOSIS — E1110 Type 2 diabetes mellitus with ketoacidosis without coma: Secondary | ICD-10-CM

## 2014-07-15 DIAGNOSIS — R748 Abnormal levels of other serum enzymes: Secondary | ICD-10-CM

## 2014-07-15 DIAGNOSIS — E1310 Other specified diabetes mellitus with ketoacidosis without coma: Secondary | ICD-10-CM

## 2014-07-15 DIAGNOSIS — E871 Hypo-osmolality and hyponatremia: Secondary | ICD-10-CM

## 2014-07-15 LAB — CYTOLOGY - PAP

## 2014-07-15 NOTE — Patient Instructions (Signed)
Miralax to help  With constipation.

## 2014-07-15 NOTE — Progress Notes (Signed)
  Subjective:     Tammy Roman is a 34 y.o. female and for hospital follow-up. She was seen last week. At times she was not feeling well and had vomited that day. Her A1c was created and 14. Later that evening she went to the emergency department and was admitted for DKA into the ICU unit. She says she's feeling significantly better today. She's not had any more nausea or vomiting.. The patient reports she has been using her Levemir every night. Yesterday sugars down to 250 yesterday.  Using levemir 20 units at betime. Taking the janumet twice a day as well. She plans on starting back to exercising next week.   History   Social History  . Marital Status: Married    Spouse Name: Kandee KeenCory  . Number of Children: 1   . Years of Education: N/A   Occupational History  . CUSTOMER SERVICE    Social History Main Topics  . Smoking status: Never Smoker   . Smokeless tobacco: Never Used  . Alcohol Use: 0.0 oz/week    0 Standard drinks or equivalent per week     Comment: occasional wine  . Drug Use: No  . Sexual Activity:    Partners: Male     Comment: works for [employer], married, finishing [employer] school, married,2 kids, no exercise.   Other Topics Concern  . Not on file   Social History Narrative   Some regular exercise.  No caffeine daily.    Health Maintenance  Topic Date Due  . PNEUMOCOCCAL POLYSACCHARIDE VACCINE (1) 11/24/1983  . OPHTHALMOLOGY EXAM  11/24/1991  . INFLUENZA VACCINE  07/08/2015 (Originally 12/04/2013)  . HEMOGLOBIN A1C  01/08/2015  . FOOT EXAM  07/08/2015  . URINE MICROALBUMIN  07/08/2015  . PAP SMEAR  07/07/2016  . TETANUS/TDAP  04/10/2020  . HIV Screening  Completed    The following portions of the patient's history were reviewed and updated as appropriate: allergies, current medications, past family history, past medical history, past social history, past surgical history and problem list.  Review of Systems A comprehensive review of systems was negative.    Objective:    BP 143/85 mmHg  Pulse 98  Wt 261 lb (118.389 kg)  SpO2 100%  LMP 06/12/2014 General appearance: alert, cooperative, appears stated age and moderately obese Head: Normocephalic, without obvious abnormality, atraumatic Neck: no adenopathy, no carotid bruit, no JVD, supple, symmetrical, trachea midline and thyroid not enlarged, symmetric, no tenderness/mass/nodules Lungs: clear to auscultation bilaterally Heart: regular rate and rhythm, S1, S2 normal, no murmur, click, rub or gallop    Assessment:       Hospital F/U   Plan:     DKA - resolved.  Feeling much better.  DM- Uncontrolled. Increase Levemir by one unit a day until fasting sugars under 150.  Follow-up in 6 weeks. She concerned because she hasn't palms or concerns in between. Will place referral for diabetic nutrition counseling.   hyponatremia-would like to recheck sodium level in 1-2 weeks. Next  Elevated liver enzymes-again most likely related to acute illness. We'll recheck liver enzymes in about 2 weeks.

## 2014-07-21 ENCOUNTER — Encounter: Payer: Self-pay | Admitting: Obstetrics & Gynecology

## 2014-07-21 DIAGNOSIS — R87810 Cervical high risk human papillomavirus (HPV) DNA test positive: Secondary | ICD-10-CM | POA: Insufficient documentation

## 2014-07-22 ENCOUNTER — Telehealth: Payer: Self-pay | Admitting: *Deleted

## 2014-07-22 NOTE — Telephone Encounter (Signed)
-----   Message from Tereso NewcomerUgonna A Anyanwu, MD sent at 07/21/2014  2:18 PM EDT ----- Normal pap, but positive HRHPV (negative 16/18).  Repeat cotesting in one year.  Please call to inform patient of results and recommendations.

## 2014-07-22 NOTE — Telephone Encounter (Signed)
Pt notified of pap results and need for repeat pap in 1 year.

## 2014-07-26 ENCOUNTER — Encounter: Payer: Self-pay | Admitting: Family Medicine

## 2014-08-18 ENCOUNTER — Telehealth: Payer: Self-pay | Admitting: Family Medicine

## 2014-08-18 MED ORDER — GLUCOSE BLOOD VI STRP: ORAL_STRIP | Status: DC

## 2014-08-18 MED ORDER — ONETOUCH DELICA LANCETS 33G MISC: Status: DC

## 2014-08-18 NOTE — Telephone Encounter (Signed)
rx sent.Andreia Gandolfi Lynetta  

## 2014-08-18 NOTE — Telephone Encounter (Signed)
Patient lancets and strips to [facility] [place].  You have never ordered for her but she is out of what supplies she was given by the hospital doctor.  thanks

## 2014-08-26 ENCOUNTER — Ambulatory Visit: Payer: 59 | Admitting: Family Medicine

## 2014-08-26 ENCOUNTER — Other Ambulatory Visit: Payer: Self-pay | Admitting: Family Medicine

## 2014-08-26 MED ORDER — SITAGLIPTIN PHOS-METFORMIN HCL 50-1000 MG PO TABS: 1.0000 | ORAL_TABLET | Freq: Two times a day (BID) | ORAL | Status: DC

## 2014-08-26 NOTE — Telephone Encounter (Signed)
Pt requested 90 day supply per insurance.

## 2014-08-29 ENCOUNTER — Encounter: Payer: Self-pay | Admitting: Family Medicine

## 2014-08-29 ENCOUNTER — Ambulatory Visit (INDEPENDENT_AMBULATORY_CARE_PROVIDER_SITE_OTHER): Payer: 59 | Admitting: Family Medicine

## 2014-08-29 VITALS — BP 130/81 | HR 92 | Ht 67.0 in | Wt 260.0 lb

## 2014-08-29 DIAGNOSIS — E118 Type 2 diabetes mellitus with unspecified complications: Secondary | ICD-10-CM | POA: Diagnosis not present

## 2014-08-29 DIAGNOSIS — R748 Abnormal levels of other serum enzymes: Secondary | ICD-10-CM | POA: Diagnosis not present

## 2014-08-29 DIAGNOSIS — E871 Hypo-osmolality and hyponatremia: Secondary | ICD-10-CM

## 2014-08-29 LAB — COMPLETE METABOLIC PANEL WITH GFR
ALK PHOS: 78 U/L (ref 39–117)
ALT: 19 U/L (ref 0–35)
AST: 18 U/L (ref 0–37)
Albumin: 4 g/dL (ref 3.5–5.2)
BILIRUBIN TOTAL: 0.2 mg/dL (ref 0.2–1.2)
BUN: 11 mg/dL (ref 6–23)
CALCIUM: 8.8 mg/dL (ref 8.4–10.5)
CO2: 25 mEq/L (ref 19–32)
Chloride: 105 mEq/L (ref 96–112)
Creat: 0.63 mg/dL (ref 0.50–1.10)
GFR, Est African American: >89 mL/min
GFR, Est Non African American: >89 mL/min
GLUCOSE: 85 mg/dL (ref 70–99)
Potassium: 4.2 mEq/L (ref 3.5–5.3)
SODIUM: 138 meq/L (ref 135–145)
TOTAL PROTEIN: 6.4 g/dL (ref 6.0–8.3)

## 2014-08-29 MED ORDER — VALACYCLOVIR HCL 500 MG PO TABS: 500.0000 mg | ORAL_TABLET | Freq: Every day | ORAL | Status: DC

## 2014-08-29 NOTE — Progress Notes (Signed)
   Subjective:    Patient ID: Tammy Roman, female    DOB: 05/22/1980, 34 y.o.   MRN: [id number]  HPI Diabetes - no hypoglycemic events. No wounds or sores that are not healing well. No increased thirst or urination. Checking glucose at home. Taking medications as prescribed without any side effects. When last here she was f/u for DKA.  She was feeling much better. She is on levemir. She is now on 26 units.  AM glucose in the 80s.  She has started nutrition therapy.    Elevated liver func - due to repeat labs.   Hyponatremia - due to repeat labs.   Review of Systems     Objective:   Physical Exam  Constitutional: She is oriented to person, place, and time. She appears well-developed and well-nourished.  HENT:  Head: Normocephalic and atraumatic.  Cardiovascular: Normal rate, regular rhythm and normal heart sounds.   Pulmonary/Chest: Effort normal and breath sounds normal.  Neurological: She is alert and oriented to person, place, and time.  Skin: Skin is warm and dry.  Psychiatric: She has a normal mood and affect. Her behavior is normal.          Assessment & Plan:  Diabetes - Well controlled. She is doing nutrition therapy.   F/u in 6 weeks.  Will be due for A1C at that time.  Recommend hold off on eye exam until we see next A1C.    Elevated liver func - due to repeat labs.   Hyponatremia - due to repeat labs.

## 2014-09-28 ENCOUNTER — Other Ambulatory Visit: Payer: Self-pay | Admitting: *Deleted

## 2014-09-28 MED ORDER — INSULIN DETEMIR 100 UNIT/ML ~~LOC~~ SOLN: 20.0000 [IU] | Freq: Every day | SUBCUTANEOUS | Status: DC

## 2014-10-10 ENCOUNTER — Ambulatory Visit: Payer: 59 | Admitting: Family Medicine

## 2015-02-09 IMAGING — US US EXTREM LOW VENOUS BILAT
1 series · 14 of 24 positions shown · non-contrast
Comparison: None.

CLINICAL DATA: Bilateral lower extremity swelling, postpartum

VENOUS DUPLEX ULTRASOUND OF BILATERAL LOWER EXTREMITIES
TECHNIQUE: Gray-scale sonography with graded compression, as well
as color Doppler and duplex ultrasound, were performed to evaluate
the deep venous system of both lower extremities from the level of
the common femoral vein through the popliteal and proximal calf
veins.  Spectral Doppler was utilized to evaluate flow at rest and
with distal augmentation maneuvers.

[Series 1: us extrem low venous bilat · 14 of 31 slices shown]
[im 1/31]
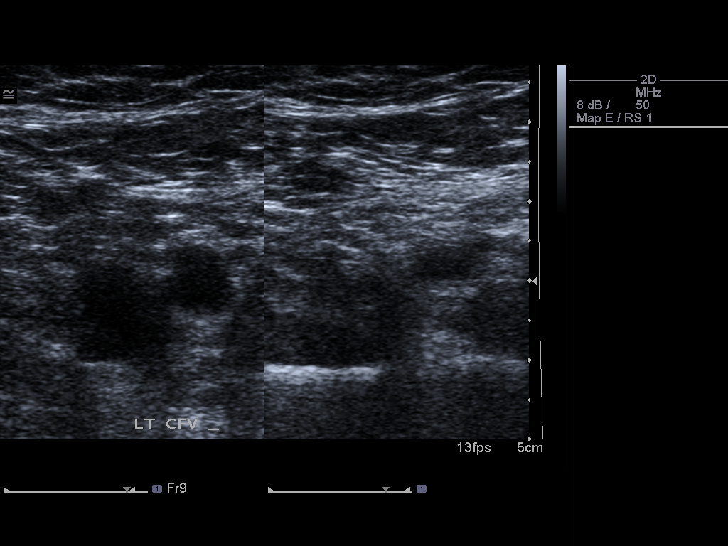
[im 3/31]
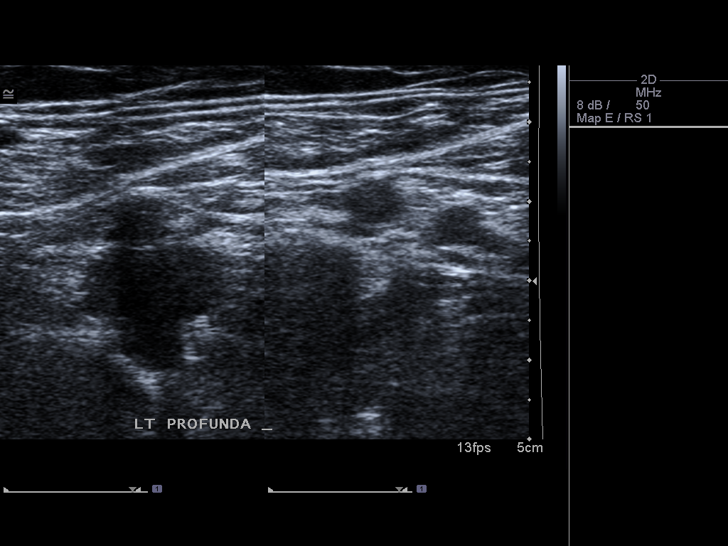
[im 6/31]
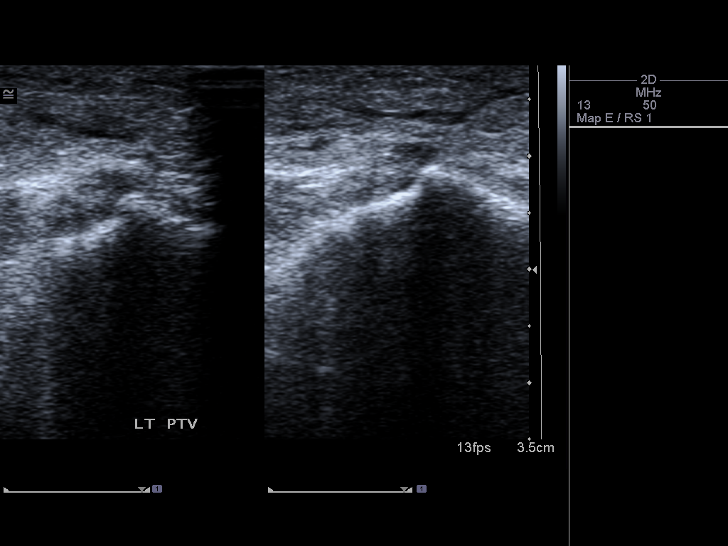
[im 8/31]
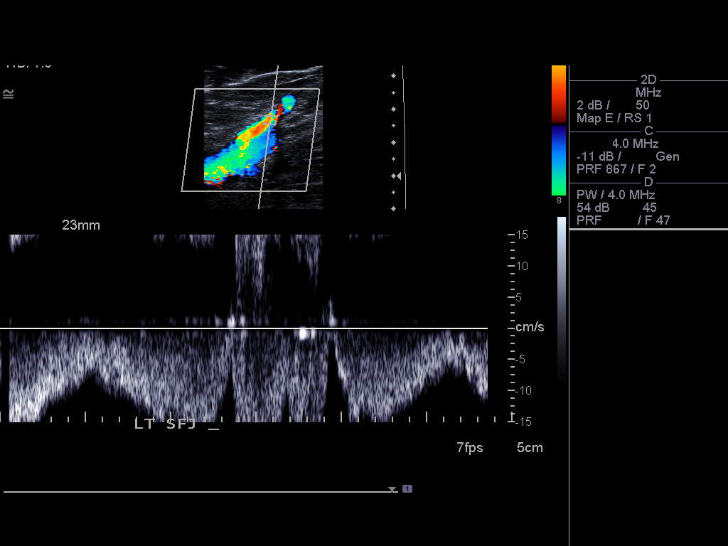
[im 10/31]
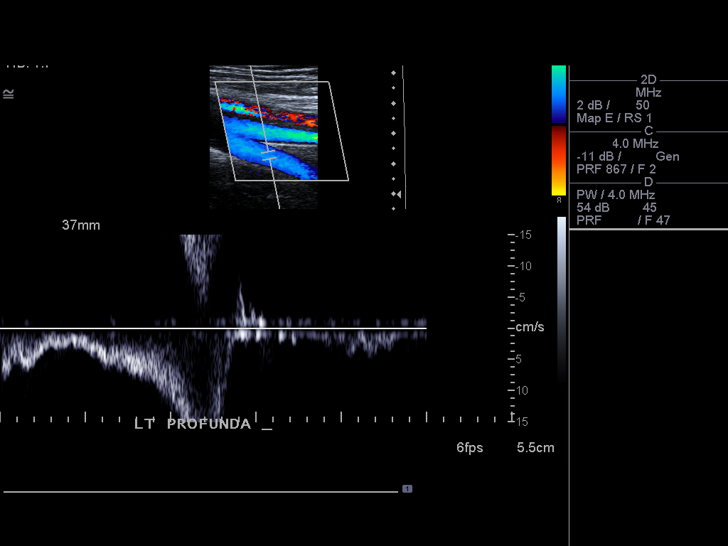
[im 12/31]
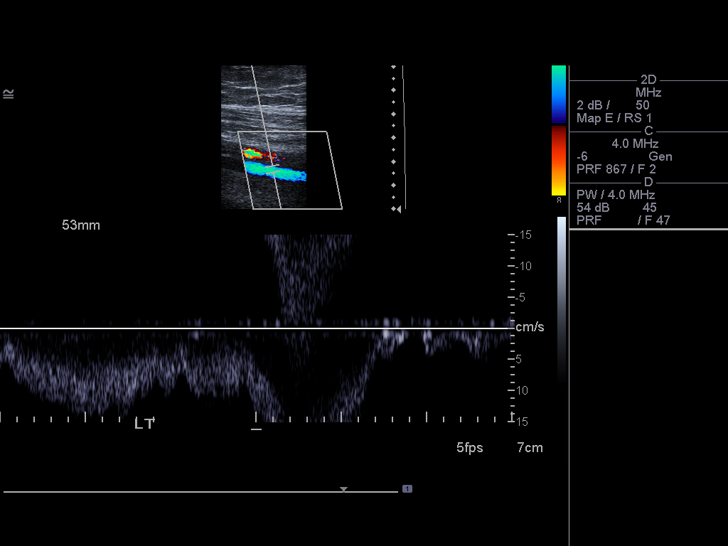
[im 15/31]
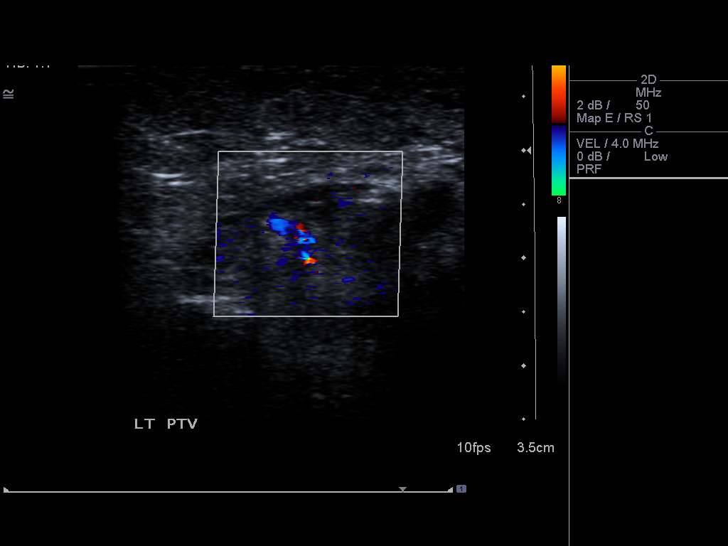
[im 16/31]
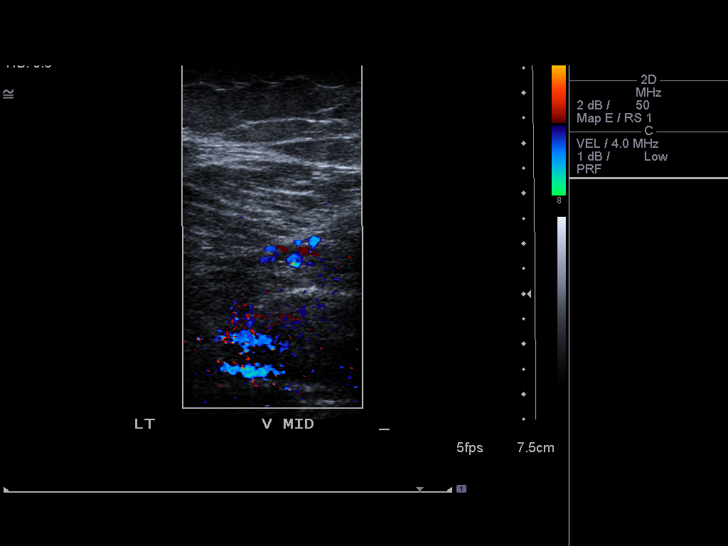
[im 19/31]
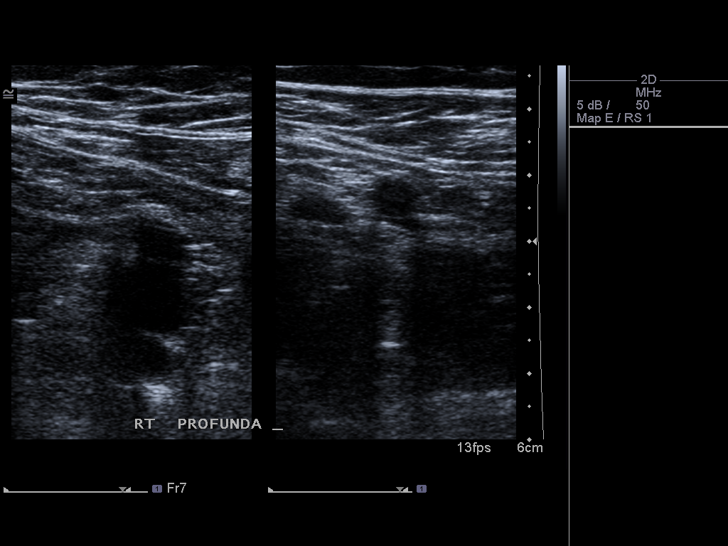
[im 21/31]
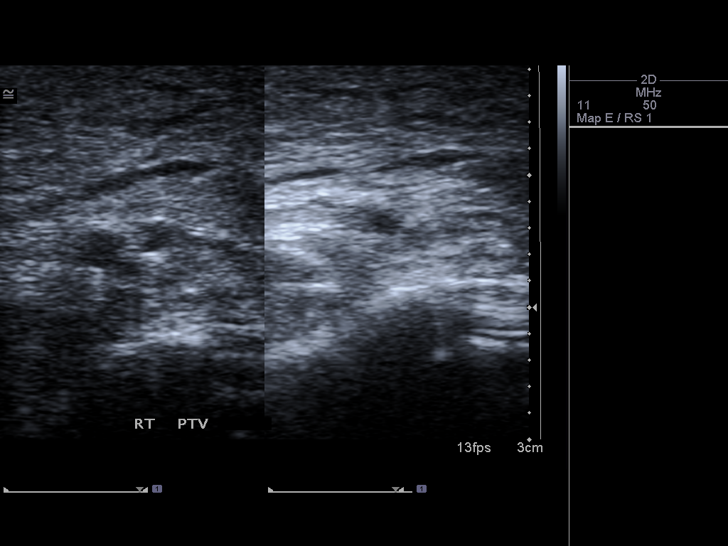
[im 24/31]
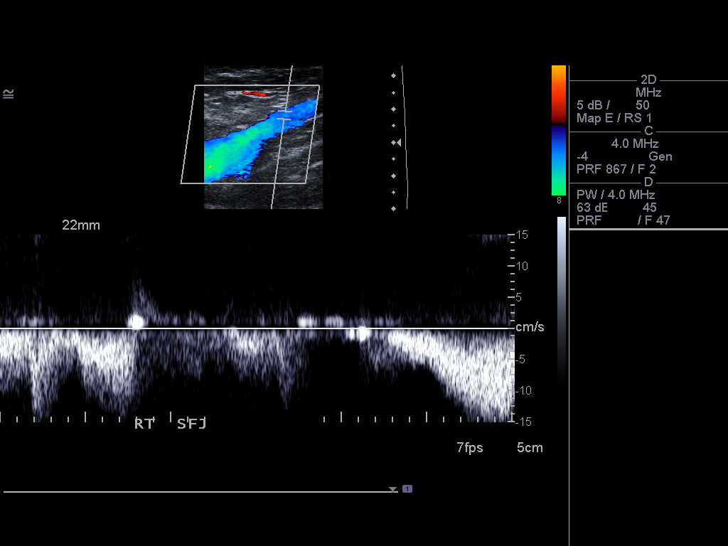
[im 25/31]
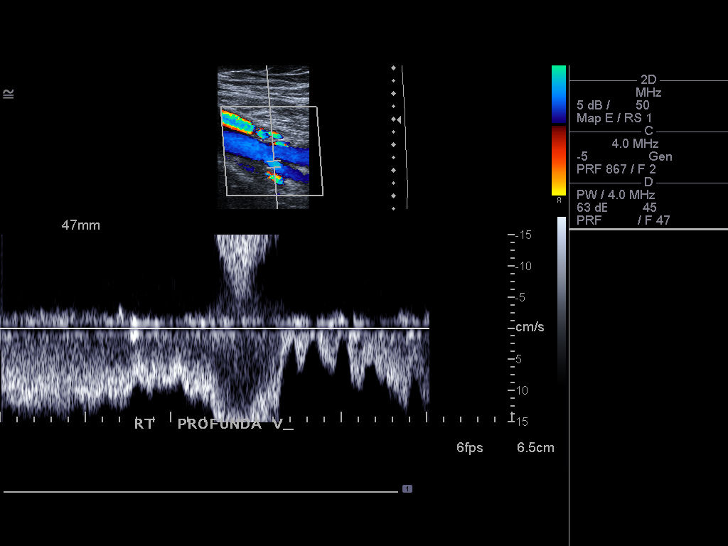
[im 28/31]
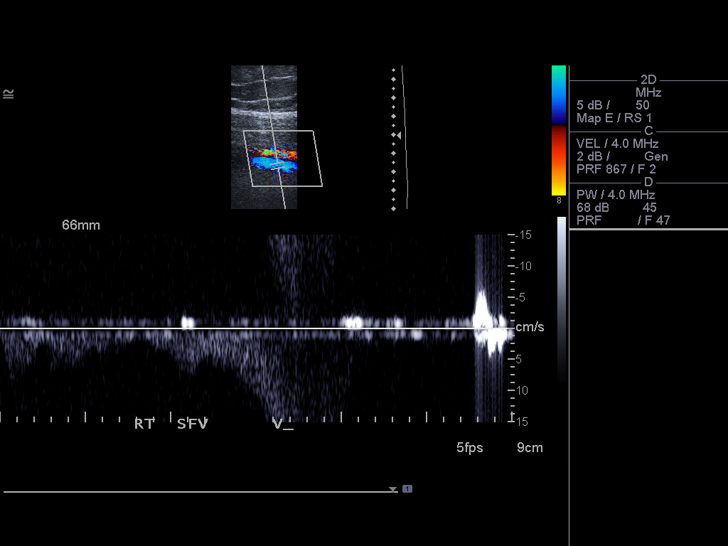
[im 31/31]
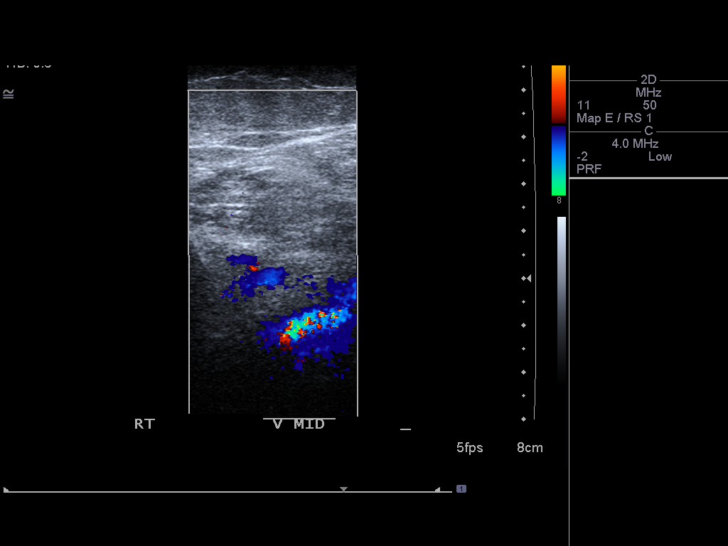

[14 of 24 positions shown; findings below may reference images not displayed]

FINDINGS: The visualized bilateral lower extremity deep venous
systems are patent.

Normal compressibility.  Patent color Doppler flow.  Satisfactory
spectral Doppler with respiratory variation and response to
augmentation.

The greater saphenous vein, where visualized, is patent and
compressible bilaterally.

Subcutaneous edema in the bilateral ankles.
IMPRESSION: No deep venous thrombosis in the visualized bilateral lower
extremities.

## 2015-03-13 ENCOUNTER — Other Ambulatory Visit: Payer: Self-pay | Admitting: *Deleted

## 2015-03-13 MED ORDER — GLUCOSE BLOOD VI STRP: ORAL_STRIP | Status: DC

## 2015-03-13 MED ORDER — GLUCOSE BLOOD VI STRP
ORAL_STRIP | Status: DC
Start: 2015-03-13 — End: 2015-03-13

## 2015-03-14 ENCOUNTER — Other Ambulatory Visit: Payer: Self-pay | Admitting: *Deleted

## 2015-03-14 MED ORDER — GLUCOSE BLOOD VI STRP: ORAL_STRIP | Status: DC

## 2015-06-14 ENCOUNTER — Encounter: Payer: Self-pay | Admitting: Obstetrics & Gynecology

## 2015-06-14 ENCOUNTER — Ambulatory Visit (INDEPENDENT_AMBULATORY_CARE_PROVIDER_SITE_OTHER): Payer: BLUE CROSS/BLUE SHIELD | Admitting: Obstetrics & Gynecology

## 2015-06-14 VITALS — BP 131/86 | HR 89 | Resp 16 | Ht 67.0 in | Wt 257.0 lb

## 2015-06-14 DIAGNOSIS — Z124 Encounter for screening for malignant neoplasm of cervix: Secondary | ICD-10-CM

## 2015-06-14 DIAGNOSIS — Z1151 Encounter for screening for human papillomavirus (HPV): Secondary | ICD-10-CM

## 2015-06-14 DIAGNOSIS — Z01419 Encounter for gynecological examination (general) (routine) without abnormal findings: Secondary | ICD-10-CM | POA: Diagnosis not present

## 2015-06-14 NOTE — Progress Notes (Addendum)
Subjective:    Tammy Roman is a 35 y.o. M P2 (60 and 69 yo kids)  female who presents for an annual exam. The patient has no complaints today. She may have a yeast infection. She tried OTC monistat with no relief. The patient is sexually active. GYN screening history: last pap: was normal. The patient wears seatbelts: yes. The patient participates in regular exercise: yes. Has the patient ever been transfused or tattooed?: no. The patient reports that there is not domestic violence in her life.   Menstrual History: OB History    Gravida Para Term Preterm AB TAB SAB Ectopic Multiple Living   Menarche age: 72  No LMP recorded. Patient is not currently having periods (Reason: IUD).    The following portions of the patient's history were reviewed and updated as appropriate: allergies, current medications, past family history, past medical history, past social history, past surgical history and problem list.  Review of Systems Pertinent items noted in HPI and remainder of comprehensive ROS otherwise negative. Married for 5 years. Denies dyspareunia. She has a [employer] fast streamer, for 3 years. She does not have any bleeding!  She had a flu vaccine 10/16. She works for [employer].   Objective:    BP 131/86 mmHg  Pulse 89  Resp 16  Ht  (1.702 m)  Wt 257 lb (116.574 kg)  BMI 40.24 kg/m2  General Appearance:    Alert, cooperative, no distress, appears stated age  Head:    Normocephalic, without obvious abnormality, atraumatic  Eyes:    PERRL, conjunctiva/corneas clear, EOM's intact, fundi    benign, both eyes  Ears:    Normal TM's and external ear canals, both ears  Nose:   Nares normal, septum midline, mucosa normal, no drainage    or sinus tenderness  Throat:   Lips, mucosa, and tongue normal; teeth and gums normal  Neck:   Supple, symmetrical, trachea midline, no adenopathy;    thyroid:  no enlargement/tenderness/nodules; no carotid   bruit or JVD  Back:      Symmetric, no curvature, ROM normal, no CVA tenderness  Lungs:     Clear to auscultation bilaterally, respirations unlabored  Chest Wall:    No tenderness or deformity   Heart:    Regular rate and rhythm, S1 and S2 normal, no murmur, rub   or gallop  Breast Exam:    No tenderness, masses, or nipple abnormality  Abdomen:     Soft, non-tender, bowel sounds active all four quadrants,    no masses, no organomegaly  Genitalia:    Normal female without lesion, discharge or tenderness, NSSA, NT, mobile, 3rd degree uterine prolapse, IUD string seen, normal adnexal exam     Extremities:   Extremities normal, atraumatic, no cyanosis or edema  Pulses:   2+ and symmetric all extremities  Skin:   Skin color, texture, turgor normal, no rashes or lesions  Lymph nodes:   Cervical, supraclavicular, and axillary nodes normal  Neurologic:   CNII-XII intact, normal strength, sensation and reflexes    throughout  .    Assessment:    Healthy female exam.    Plan:     Breast self exam technique reviewed and patient encouraged to perform self-exam monthly. Thin prep Pap smear. with cotesting (She is aware of ACOG rec)

## 2015-06-15 LAB — CYTOLOGY - PAP

## 2016-06-03 DIAGNOSIS — E119 Type 2 diabetes mellitus without complications: Secondary | ICD-10-CM | POA: Diagnosis not present

## 2016-06-03 DIAGNOSIS — Z6841 Body Mass Index (BMI) 40.0 and over, adult: Secondary | ICD-10-CM | POA: Diagnosis not present

## 2016-06-03 DIAGNOSIS — Z23 Encounter for immunization: Secondary | ICD-10-CM | POA: Diagnosis not present

## 2016-08-13 DIAGNOSIS — H5213 Myopia, bilateral: Secondary | ICD-10-CM | POA: Diagnosis not present

## 2016-08-13 DIAGNOSIS — Z0100 Encounter for examination of eyes and vision without abnormal findings: Secondary | ICD-10-CM | POA: Diagnosis not present

## 2016-09-18 ENCOUNTER — Ambulatory Visit (INDEPENDENT_AMBULATORY_CARE_PROVIDER_SITE_OTHER): Payer: Managed Care, Other (non HMO) | Admitting: Obstetrics & Gynecology

## 2016-09-18 ENCOUNTER — Encounter: Payer: Self-pay | Admitting: Obstetrics & Gynecology

## 2016-09-18 VITALS — BP 124/83 | HR 93 | Ht 67.0 in | Wt 257.0 lb

## 2016-09-18 DIAGNOSIS — Z01419 Encounter for gynecological examination (general) (routine) without abnormal findings: Secondary | ICD-10-CM

## 2016-09-18 DIAGNOSIS — R87610 Atypical squamous cells of undetermined significance on cytologic smear of cervix (ASC-US): Secondary | ICD-10-CM | POA: Diagnosis not present

## 2016-09-18 MED ORDER — FLUCONAZOLE 150 MG PO TABS: 150.0000 mg | ORAL_TABLET | Freq: Once | ORAL | 3 refills | Status: AC

## 2016-09-18 MED ORDER — VALACYCLOVIR HCL 500 MG PO TABS: 500.0000 mg | ORAL_TABLET | Freq: Every day | ORAL | 12 refills | Status: DC

## 2016-09-18 NOTE — Progress Notes (Signed)
Subjective:    Tammy Roman is a 36 y.o. M AA P2 28(35 yo daughter and 234 yo son) female who presents for an annual exam. The patient has no complaints today. The patient is sexually active. GYN screening history: last pap: was normal. The patient wears seatbelts: yes. The patient participates in regular exercise: yes . Has the patient ever been transfused or tattooed?: no. The patient reports that there is not domestic violence in her life.   Menstrual History: OB History    Gravida Para Term Preterm AB Living   2 2 2     2    SAB TAB Ectopic Multiple Live Births           1      Menarche age: [age] No LMP recorded (lmp unknown). Patient is not currently having periods (Reason: IUD).    The following portions of the patient's history were reviewed and updated as appropriate: allergies, current medications, past family history, past medical history, past social history, past surgical history and problem list.  Review of Systems Pertinent items are noted in HPI.   FH- + breast cancer- paunt deceased in 7840s, other paunt with ovarian cancer, + prostate cancer in father Married for 7 years, some dull cramping recently Works at [employer] Has Mirena, expires next year, no periods!   Objective:    BP 124/83   Pulse 93   Ht 5\' 7"  (1.702 m)   Wt 257 lb (116.6 kg)   LMP  (LMP Unknown)   BMI 40.25 kg/m   General Appearance:    Alert, cooperative, no distress, appears stated age  Head:    Normocephalic, without obvious abnormality, atraumatic  Eyes:    PERRL, conjunctiva/corneas clear, EOM's intact, fundi    benign, both eyes  Ears:    Normal TM's and external ear canals, both ears  Nose:   Nares normal, septum midline, mucosa normal, no drainage    or sinus tenderness  Throat:   Lips, mucosa, and tongue normal; teeth and gums normal  Neck:   Supple, symmetrical, trachea midline, no adenopathy;    thyroid:  no enlargement/tenderness/nodules; no carotid   bruit or JVD  Back:      Symmetric, no curvature, ROM normal, no CVA tenderness  Lungs:     Clear to auscultation bilaterally, respirations unlabored  Chest Wall:    No tenderness or deformity   Heart:    Regular rate and rhythm, S1 and S2 normal, no murmur, rub   or gallop  Breast Exam:    No tenderness, masses, or nipple abnormality  Abdomen:     Soft, non-tender, bowel sounds active all four quadrants,    no masses, no organomegaly  Genitalia:    Normal female without lesion, discharge or tenderness, rash on outside c/w yeast, NSSA, NT, mobile, no adnexal masses or tenderness, 2nd degree cervical prolapse     Extremities:   Extremities normal, atraumatic, no cyanosis or edema  Pulses:   2+ and symmetric all extremities  Skin:   Skin color, texture, turgor normal, no rashes or lesions  Lymph nodes:   Cervical, supraclavicular, and axillary nodes normal  Neurologic:   CNII-XII intact, normal strength, sensation and reflexes    throughout  .    Assessment:    Healthy female exam.   FH suspicous for genetic cause yeast   Plan:     Thin prep Pap smear. with cotesting My Risk test Diflucan Replace Mirena next year

## 2016-09-20 LAB — CYTOLOGY - PAP
DIAGNOSIS: NEGATIVE
HPV: NOT DETECTED

## 2017-02-07 DIAGNOSIS — E119 Type 2 diabetes mellitus without complications: Secondary | ICD-10-CM | POA: Diagnosis not present

## 2017-03-13 DIAGNOSIS — E119 Type 2 diabetes mellitus without complications: Secondary | ICD-10-CM | POA: Diagnosis not present

## 2017-06-19 DIAGNOSIS — E119 Type 2 diabetes mellitus without complications: Secondary | ICD-10-CM | POA: Diagnosis not present

## 2017-06-26 ENCOUNTER — Encounter: Payer: Self-pay | Admitting: Obstetrics and Gynecology

## 2017-06-26 ENCOUNTER — Ambulatory Visit: Payer: Managed Care, Other (non HMO) | Admitting: Obstetrics and Gynecology

## 2017-06-26 VITALS — Ht 67.0 in | Wt 256.0 lb

## 2017-06-26 DIAGNOSIS — Z30433 Encounter for removal and reinsertion of intrauterine contraceptive device: Secondary | ICD-10-CM

## 2017-06-26 DIAGNOSIS — Z01812 Encounter for preprocedural laboratory examination: Secondary | ICD-10-CM

## 2017-06-26 MED ORDER — LEVONORGESTREL 20 MCG/24HR IU IUD
INTRAUTERINE_SYSTEM | Freq: Once | INTRAUTERINE | Status: AC
  Administered 2017-06-26: 16:00:00 via INTRAUTERINE

## 2017-06-26 NOTE — Progress Notes (Signed)
37 yo P2 here for IUD removal and replacement. Patient reports doing well with IUD and is without complaints.  GYNECOLOGY CLINIC PROCEDURE NOTE  Tammy Roman is a 37 y.o. [phone] here for IUD removal. No GYN concerns.  Last pap smear was on 09/2016 and was normal.  IUD Removal  Patient identified, informed consent performed, consent signed.  Patient was in the dorsal lithotomy position, normal external genitalia was noted.  A speculum was placed in the patient's vagina, normal discharge was noted, no lesions. The cervix was visualized, no lesions, no abnormal discharge.  The strings of the IUD were not visualized, so Kelly forceps were introduced into the endometrial cavity and the IUD was grasped and removed in its entirety.  Patient tolerated the procedure well.    IUD Procedure Note Cervix visualized and cleaned with Betadine x 2.  Grasped anteriorly with a single tooth tenaculum.  Uterus sounded to 8 cm.  Mirena IUD placed per manufacturer's recommendations.  Strings trimmed to 3 cm. Tenaculum was removed, good hemostasis noted.  Patient tolerated procedure well.   Patient given post procedure instructions and Mirena care card with expiration date.  Patient is asked to check IUD strings periodically and follow up in 4-6 weeks for IUD check.

## 2017-06-26 NOTE — Addendum Note (Signed)
Addended by: Marya LandryFOSTER, Kiaja Shorty D on: 06/26/2017 03:58 PM   Modules accepted: Orders

## 2017-07-14 DIAGNOSIS — E119 Type 2 diabetes mellitus without complications: Secondary | ICD-10-CM | POA: Diagnosis not present

## 2017-09-13 DIAGNOSIS — R600 Localized edema: Secondary | ICD-10-CM | POA: Diagnosis not present

## 2017-09-26 ENCOUNTER — Encounter (HOSPITAL_BASED_OUTPATIENT_CLINIC_OR_DEPARTMENT_OTHER): Payer: Self-pay

## 2017-09-26 ENCOUNTER — Emergency Department (HOSPITAL_BASED_OUTPATIENT_CLINIC_OR_DEPARTMENT_OTHER)
Admission: EM | Admit: 2017-09-26 | Discharge: 2017-09-26 | Disposition: A | Discharge status: Home / Self Care | Payer: Managed Care, Other (non HMO) | Attending: Emergency Medicine | Admitting: Emergency Medicine

## 2017-09-26 ENCOUNTER — Emergency Department (HOSPITAL_BASED_OUTPATIENT_CLINIC_OR_DEPARTMENT_OTHER): Payer: Managed Care, Other (non HMO)

## 2017-09-26 ENCOUNTER — Other Ambulatory Visit: Payer: Self-pay

## 2017-09-26 DIAGNOSIS — Z7982 Long term (current) use of aspirin: Secondary | ICD-10-CM | POA: Insufficient documentation

## 2017-09-26 DIAGNOSIS — S9032XA Contusion of left foot, initial encounter: Secondary | ICD-10-CM | POA: Insufficient documentation

## 2017-09-26 DIAGNOSIS — E119 Type 2 diabetes mellitus without complications: Secondary | ICD-10-CM | POA: Diagnosis not present

## 2017-09-26 DIAGNOSIS — Y939 Activity, unspecified: Secondary | ICD-10-CM | POA: Diagnosis not present

## 2017-09-26 DIAGNOSIS — Y999 Unspecified external cause status: Secondary | ICD-10-CM | POA: Diagnosis not present

## 2017-09-26 DIAGNOSIS — Z79899 Other long term (current) drug therapy: Secondary | ICD-10-CM | POA: Insufficient documentation

## 2017-09-26 DIAGNOSIS — G5792 Unspecified mononeuropathy of left lower limb: Secondary | ICD-10-CM | POA: Diagnosis not present

## 2017-09-26 DIAGNOSIS — W228XXA Striking against or struck by other objects, initial encounter: Secondary | ICD-10-CM | POA: Diagnosis not present

## 2017-09-26 DIAGNOSIS — M792 Neuralgia and neuritis, unspecified: Secondary | ICD-10-CM | POA: Insufficient documentation

## 2017-09-26 DIAGNOSIS — M79672 Pain in left foot: Secondary | ICD-10-CM | POA: Diagnosis not present

## 2017-09-26 DIAGNOSIS — Y929 Unspecified place or not applicable: Secondary | ICD-10-CM | POA: Diagnosis not present

## 2017-09-26 DIAGNOSIS — S99922A Unspecified injury of left foot, initial encounter: Secondary | ICD-10-CM | POA: Diagnosis not present

## 2017-09-26 NOTE — ED Triage Notes (Signed)
Pt  states TV fell on her left foot 3 weeks ago-NAD-steady gait

## 2017-09-26 NOTE — ED Provider Notes (Signed)
[facility] DEPARTMENT Provider Note   CSN: [id number] Arrival date & time: 09/26/17  2210     History   Chief Complaint Chief Complaint  Patient presents with  . Foot Injury    HPI Tammy Roman is a 37 y.o. female.  HPI Patient presents the emergency department because of ongoing discomfort and pain in her left foot over the past 3 weeks since dropping a TV on it.  She reports numbness and tingling in her left great toe.  No significant bruising or swelling.  She continues to ambulate on this.  Pain is mild in severity.  No other complaints.   Past Medical History:  Diagnosis Date  . Abnormal Pap smear of cervix    HPV  . Diabetes mellitus without complication ([place])   . HSV-2 (herpes simplex virus 2) infection   . Obesity     Patient Active Problem List   Diagnosis Date Noted  . Normal pap and positive HRHPV on 07/13/14 07/21/2014  . DKA (diabetic ketoacidoses) ([place]) 07/09/2014  . Morbid obesity ([place]) 07/07/2013  . Mid back pain 04/09/2013  . Posterior pain of right hip 06/08/2012  . Hypokalemia 05/27/2012  . Leukocytosis 05/27/2012  . Anemia 05/27/2012  . ELEVATED BLOOD PRESSURE WITHOUT DIAGNOSIS OF HYPERTENSION 04/06/2009    Past Surgical History:  Procedure Laterality Date  . WISDOM TOOTH EXTRACTION       OB History    Gravida  2   Para  2   Term  2   Preterm      AB      Living  2     SAB      TAB      Ectopic      Multiple      Live Births  1            Home Medications    Prior to Admission medications   Medication Sig Start Date End Date Taking? Authorizing Provider  aspirin 81 MG chewable tablet Chew by mouth daily.    [provider]  azithromycin (ZITHROMAX) 500 MG tablet TAKE 1 TABLET (500 MG TOTAL) BY MOUTH DAILY. FOR 5 DAYS 06/05/15   [provider]  B-D ULTRA-FINE 33 LANCETS MISC  08/18/14   [provider]  benzonatate (TESSALON) 200 MG capsule TAKE 1 CAPSULE (200  MG TOTAL) BY MOUTH THREE (3) TIMES A DAY AS NEEDED FOR COUGH. FOR UP TO 7 DAYS 06/05/15   [provider]  blood glucose meter kit and supplies Dispense based on patient and insurance preference. Use up to four times daily as directed. (FOR ICD-9 250.00, 250.01). Patient not taking: Reported on 09/18/2016 07/11/14   Velvet Bathe, MD  glucose blood (ONE TOUCH ULTRA TEST) test strip  12/16/14   [provider]  HYDROcodone-homatropine (HYCODAN) 5-1.5 MG/5ML syrup Take by mouth. 06/05/15   [provider]  ibuprofen (ADVIL,MOTRIN) 800 MG tablet Take 800 mg by mouth every 8 (eight) hours as needed. For pain. 05/25/12   Linda Hedges, DO  levonorgestrel (MIRENA) 20 MCG/24HR IUD by Intrauterine route.    [provider]  lisinopril (PRINIVIL,ZESTRIL) 2.5 MG tablet Take 2.5 mg by mouth daily. 06/17/16   [provider]  Jonetta Speak LANCETS 08M MISC Use up to four times daily as directed. (FOR ICD-9 250.00, 250.01). Patient not taking: Reported on 09/18/2016 08/18/14   Hali Marry, MD  sitaGLIPtin-metformin (JANUMET) 50-1000 MG per tablet Take 1 tablet by mouth 2 (  two) times daily with a meal. 08/26/14   Hali Marry, MD  valACYclovir (VALTREX) 500 MG tablet Take 1 tablet (500 mg total) by mouth daily. Patient not taking: Reported on 06/26/2017 09/18/16   Emily Filbert, MD    Family History Family History  Problem Relation Age of Onset  . Hyperlipidemia Mother   . Hypertension Mother   . Hyperlipidemia Father   . Hypertension Father   . Prostate cancer Father   . Cancer Father        prostate  . Heart attack Father   . Breast cancer Paternal Aunt        metastasized   . Ovarian cancer Paternal Aunt     Social History Social History   Tobacco Use  . Smoking status: Never Smoker  . Smokeless tobacco: Never Used  Substance Use Topics  . Alcohol use: No    Alcohol/week: 0.0 oz  . Drug use: No     Allergies   Patient has no known  allergies.   Review of Systems Review of Systems  All other systems reviewed and are negative.    Physical Exam Updated Vital Signs BP 127/78 (BP Location: Left Arm)   Pulse 88   Temp 98.2 F (36.8 C) (Oral)   Resp 18   Ht '5\' 7"'$  (1.702 m)   Wt 117 kg (258 lb)   SpO2 96%   BMI 40.41 kg/m   Physical Exam  Constitutional: She is oriented to person, place, and time. She appears well-developed and well-nourished.  HENT:  Head: Normocephalic.  Eyes: EOM are normal.  Neck: Normal range of motion.  Pulmonary/Chest: Effort normal.  Abdominal: She exhibits no distension.  Musculoskeletal:  Normal perfusion of the left foot.  Normal pulses in the left foot.  Compartments left foot are soft.  Full range of motion of all 5 toes on the left foot.  No significant focal tenderness  Neurological: She is alert and oriented to person, place, and time.  Psychiatric: She has a normal mood and affect.  Nursing note and vitals reviewed.    ED Treatments / Results  Labs (all labs ordered are listed, but only abnormal results are displayed) Labs Reviewed - No data to display  EKG None  Radiology Dg Foot Complete Left  Result Date: 09/26/2017 CLINICAL DATA:  TV fell on her left foot 3 weeks ago. Shooting pain from dorsal midfoot into 1st and 2nd toes. EXAM: LEFT FOOT - COMPLETE 3+ VIEW COMPARISON:  None. FINDINGS: Small appearing ununited ossicles adjacent to the cuboidal bone and inferior to the lateral malleolus. Prominent os trigonum. No evidence of acute fracture or dislocation in the left foot. No focal bone lesion or bone destruction. Joint spaces are preserved. No radiopaque soft tissue foreign bodies. IMPRESSION: No acute bony abnormalities. Electronically Signed   By: Lucienne Capers M.D.   On: 09/26/2017 23:07    Procedures Procedures (including critical care time)  Medications Ordered in ED Medications - No data to display   Initial Impression / Assessment and Plan / ED  Course  I have reviewed the triage vital signs and the nursing notes.  Pertinent labs & imaging results that were available during my care of the patient were reviewed by me and considered in my medical decision making (see chart for details).     Contusion.  X-ray negative.  Likely some degree of neuropathic related pain.  This will likely resolve with time.  Primary care follow-up.  Final Clinical Impressions(s) /  ED Diagnoses   Final diagnoses:  Contusion of left foot, initial encounter  Neuropathic pain of foot, left    ED Discharge Orders    None       Jola Schmidt, MD 09/26/17 [phone]

## 2017-11-03 ENCOUNTER — Telehealth: Payer: Self-pay

## 2017-11-03 NOTE — Telephone Encounter (Signed)
error 

## 2017-12-16 DIAGNOSIS — I152 Hypertension secondary to endocrine disorders: Secondary | ICD-10-CM | POA: Diagnosis not present

## 2017-12-16 DIAGNOSIS — B009 Herpesviral infection, unspecified: Secondary | ICD-10-CM | POA: Diagnosis not present

## 2017-12-16 DIAGNOSIS — E1159 Type 2 diabetes mellitus with other circulatory complications: Secondary | ICD-10-CM | POA: Diagnosis not present

## 2018-01-15 ENCOUNTER — Ambulatory Visit (INDEPENDENT_AMBULATORY_CARE_PROVIDER_SITE_OTHER): Payer: Managed Care, Other (non HMO) | Admitting: Obstetrics & Gynecology

## 2018-01-15 ENCOUNTER — Encounter: Payer: Self-pay | Admitting: Obstetrics & Gynecology

## 2018-01-15 VITALS — BP 126/82 | HR 103 | Ht 66.0 in | Wt 256.0 lb

## 2018-01-15 DIAGNOSIS — Z30431 Encounter for routine checking of intrauterine contraceptive device: Secondary | ICD-10-CM

## 2018-01-15 NOTE — Progress Notes (Signed)
PT can't feel IUD strings. Just wants to have it checked

## 2018-01-15 NOTE — Progress Notes (Signed)
   Subjective:    Patient ID: Tammy Roman, female    DOB: [birth year], 37 y.o.   MRN: [id number]  HPI  37 yo married P2 here because she cannot feel the strings of her 826 month old [place]. She is having some irregular bleeding for the first time since it was placed.  Review of Systems     Objective:   Physical Exam Breathing, conversing, and ambulating normally Well nourished, well hydrated Black female, no apparent distress IUD strings seen, about 2 cm long     Assessment & Plan:  Strong FH of cancer- rec [facility]/genetic testing. She works at [employer] and plans to get it soon. She will get her flu vaccine at work.

## 2018-01-26 ENCOUNTER — Ambulatory Visit: Payer: Managed Care, Other (non HMO) | Admitting: Obstetrics & Gynecology

## 2018-03-22 ENCOUNTER — Encounter

## 2018-09-07 ENCOUNTER — Other Ambulatory Visit: Payer: Self-pay

## 2018-09-07 ENCOUNTER — Ambulatory Visit (INDEPENDENT_AMBULATORY_CARE_PROVIDER_SITE_OTHER): Payer: 59

## 2018-09-07 DIAGNOSIS — Z30431 Encounter for routine checking of intrauterine contraceptive device: Secondary | ICD-10-CM

## 2018-09-07 NOTE — Progress Notes (Signed)
PT called needing appt to check IUD. Due to limiting appts in the office we are placing an ultrasound order to know if IUD is in correct place or not.

## 2018-09-17 ENCOUNTER — Other Ambulatory Visit: Payer: Self-pay

## 2018-09-17 ENCOUNTER — Encounter: Payer: Self-pay | Admitting: Obstetrics & Gynecology

## 2018-09-17 ENCOUNTER — Ambulatory Visit: Payer: 59 | Admitting: Obstetrics & Gynecology

## 2018-09-17 ENCOUNTER — Other Ambulatory Visit (HOSPITAL_COMMUNITY)
Admission: RE | Admit: 2018-09-17 | Discharge: 2018-09-17 | Disposition: A | Discharge status: Home / Self Care | Payer: 59 | Source: Ambulatory Visit | Attending: Obstetrics & Gynecology | Admitting: Obstetrics & Gynecology

## 2018-09-17 VITALS — Ht 67.0 in | Wt 255.0 lb

## 2018-09-17 DIAGNOSIS — Z01419 Encounter for gynecological examination (general) (routine) without abnormal findings: Secondary | ICD-10-CM | POA: Diagnosis not present

## 2018-09-17 DIAGNOSIS — Z113 Encounter for screening for infections with a predominantly sexual mode of transmission: Secondary | ICD-10-CM | POA: Diagnosis not present

## 2018-09-17 NOTE — Progress Notes (Signed)
Subjective:    KYNADIE ARNOUX is a 38 y.o. separated P2 (15 daughter and 71 yo son) female who presents for an annual exam. The patient has no complaints today. The patient is sexually active. GYN screening history: last pap: was normal. The patient wears seatbelts: yes. The patient participates in regular exercise: yes. Has the patient ever been transfused or tattooed?: no. The patient reports that there is not domestic violence in her life.   Menstrual History: OB History    Gravida  2   Para  2   Term  2   Preterm      AB      Living  2     SAB      TAB      Ectopic      Multiple      Live Births  1           Menarche age: 68 No LMP recorded. (Menstrual status: IUD).    The following portions of the patient's history were reviewed and updated as appropriate: allergies, current medications, past family history, past medical history, past social history, past surgical history and problem list.  Review of Systems Pertinent items are noted in HPI.   FH- + breast in + paternal aunt, ovarian in another paternal aunt, no colon cancer, + prostate cancer in her father Using Mirena IUD for contraception, never with periods since last year Working at [employer]   Objective:    Ht 5\' 7"  (1.702 m)   Wt 255 lb (115.7 kg)   BMI 39.94 kg/m   General Appearance:    Alert, cooperative, no distress, appears stated age  Head:    Normocephalic, without obvious abnormality, atraumatic  Eyes:    PERRL, conjunctiva/corneas clear, EOM's intact, fundi    benign, both eyes  Ears:    Normal TM's and external ear canals, both ears  Nose:   Nares normal, septum midline, mucosa normal, no drainage    or sinus tenderness  Throat:   Lips, mucosa, and tongue normal; teeth and gums normal  Neck:   Supple, symmetrical, trachea midline, no adenopathy;    thyroid:  no enlargement/tenderness/nodules; no carotid   bruit or JVD  Back:     Symmetric, no curvature, ROM normal, no  CVA tenderness  Lungs:     Clear to auscultation bilaterally, respirations unlabored  Chest Wall:    No tenderness or deformity   Heart:    Regular rate and rhythm, S1 and S2 normal, no murmur, rub   or gallop  Breast Exam:    No tenderness, masses, or nipple abnormality  Abdomen:     Soft, non-tender, bowel sounds active all four quadrants,    no masses, no organomegaly  Genitalia:    Normal female without lesion, discharge or tenderness  Rectal:    Normal tone, normal prostate, no masses or tenderness;   guaiac negative stool  Extremities:   Extremities normal, atraumatic, no cyanosis or edema  Pulses:   2+ and symmetric all extremities  Skin:   Skin color, texture, turgor normal, no rashes or lesions  Lymph nodes:   Cervical, supraclavicular, and axillary nodes normal  Neurologic:   CNII-XII intact, normal strength, sensation and reflexes    throughout  .    Assessment:    Healthy female exam.   Strong FH of cancer Plan:     Thin prep Pap smear. with cotesting STI testing per patient request [facility] testing

## 2018-09-17 NOTE — Progress Notes (Signed)
Last pap 09/18/16- negative Requests STI testing

## 2018-09-18 LAB — CYTOLOGY - PAP
Diagnosis: NEGATIVE
HPV: NOT DETECTED

## 2018-09-18 LAB — RPR: RPR Ser Ql: NONREACTIVE

## 2018-09-18 LAB — HIV ANTIBODY (ROUTINE TESTING W REFLEX): HIV 1&2 Ab, 4th Generation: NONREACTIVE

## 2018-09-18 LAB — HEPATITIS C ANTIBODY
Hepatitis C Ab: NONREACTIVE
SIGNAL TO CUT-OFF: 0.02 (ref <1.00)

## 2018-09-18 LAB — HEPATITIS B SURFACE ANTIGEN: Hepatitis B Surface Ag: NONREACTIVE

## 2018-09-18 LAB — CERVICOVAGINAL ANCILLARY ONLY
Chlamydia: NEGATIVE
Neisseria Gonorrhea: NEGATIVE
Trichomonas: NEGATIVE

## 2018-09-18 LAB — HSV 2 ANTIBODY, IGG: HSV 2 Glycoprotein G Ab, IgG: 22.8 index — ABNORMAL HIGH

## 2018-10-07 ENCOUNTER — Ambulatory Visit: Payer: Managed Care, Other (non HMO) | Admitting: Obstetrics & Gynecology

## 2018-12-26 ENCOUNTER — Other Ambulatory Visit (HOSPITAL_COMMUNITY): Payer: 59

## 2018-12-30 ENCOUNTER — Encounter (HOSPITAL_BASED_OUTPATIENT_CLINIC_OR_DEPARTMENT_OTHER): Admission: RE | Payer: Self-pay | Source: Home / Self Care

## 2018-12-30 ENCOUNTER — Ambulatory Visit (HOSPITAL_BASED_OUTPATIENT_CLINIC_OR_DEPARTMENT_OTHER): Admission: RE | Admit: 2018-12-30 | Payer: 59 | Source: Home / Self Care | Admitting: Obstetrics & Gynecology

## 2018-12-30 SURGERY — SALPINGECTOMY, BILATERAL, LAPAROSCOPIC
Anesthesia: Choice | Laterality: Bilateral

## 2019-02-08 ENCOUNTER — Other Ambulatory Visit: Payer: Self-pay

## 2019-02-08 ENCOUNTER — Ambulatory Visit (INDEPENDENT_AMBULATORY_CARE_PROVIDER_SITE_OTHER): Payer: Managed Care, Other (non HMO) | Admitting: Family Medicine

## 2019-02-08 ENCOUNTER — Encounter: Payer: Self-pay | Admitting: Family Medicine

## 2019-02-08 VITALS — BP 122/81 | HR 104 | Temp 98.3°F | Resp 17 | Ht 67.0 in | Wt 253.2 lb

## 2019-02-08 DIAGNOSIS — E1165 Type 2 diabetes mellitus with hyperglycemia: Secondary | ICD-10-CM | POA: Diagnosis not present

## 2019-02-08 DIAGNOSIS — Z975 Presence of (intrauterine) contraceptive device: Secondary | ICD-10-CM | POA: Diagnosis not present

## 2019-02-08 DIAGNOSIS — Z23 Encounter for immunization: Secondary | ICD-10-CM

## 2019-02-08 MED ORDER — JANUMET 50-1000 MG PO TABS: 1.0000 | ORAL_TABLET | Freq: Two times a day (BID) | ORAL | 1 refills | Status: DC

## 2019-02-08 MED ORDER — ATORVASTATIN CALCIUM 10 MG PO TABS: 10.0000 mg | ORAL_TABLET | ORAL | 3 refills | Status: AC

## 2019-02-08 MED ORDER — BLOOD GLUCOSE METER KIT: PACK | 0 refills | Status: DC

## 2019-02-08 NOTE — Patient Instructions (Signed)
° ° ° °  If you have lab work done today you will be contacted with your lab results within the next 2 weeks.  If you have not heard from us then please contact us. The fastest way to get your results is to register for My Chart. ° ° °IF you received an x-ray today, you will receive an invoice from [facility]. Please contact [facility] at [phone] with questions or concerns regarding your invoice.  ° °IF you received labwork today, you will receive an invoice from [facility]. Please contact [facility] at [phone] with questions or concerns regarding your invoice.  ° °Our billing staff will not be able to assist you with questions regarding bills from these companies. ° °You will be contacted with the lab results as soon as they are available. The fastest way to get your results is to activate your My Chart account. Instructions are located on the last page of this paperwork. If you have not heard from us regarding the results in 2 weeks, please contact this office. °  ° ° ° °

## 2019-02-08 NOTE — Progress Notes (Signed)
New Patient Office Visit  Subjective:  Patient ID: Tammy Roman, female    DOB: Jan 31, 1981  Age: 38 y.o. MRN: [id number]  CC:  Chief Complaint  Patient presents with  . New Patient (Initial Visit)    establish care, no concerns  . Medication Refill    glucose meter kit    HPI Tammy Roman presents for    Patient has been diagnosed since 2016 She reports that she is on the [place] 50-1042m  She is also checking her blood glucose and her levels are typically 90s fasting but a few 130s. She exercises about 2-3 times a week and watches her diet She has an A1C of 5.5% check at work.  She denies hypoglycemia  She is improving her weight and working on her weight loss Body mass index is 39.66 kg/m.  IUD in place She had her IUD placed in 2018 She is tolerating it well without concerns She chose the IUD as contraception There is no history of anemia   Past Medical History:  Diagnosis Date  . Abnormal Pap smear of cervix    HPV  . Diabetes mellitus without complication ([place]   . HSV-2 (herpes simplex virus 2) infection   . Obesity     Past Surgical History:  Procedure Laterality Date  . WISDOM TOOTH EXTRACTION      Family History  Problem Relation Age of Onset  . Hyperlipidemia Mother   . Hypertension Mother   . Hyperlipidemia Father   . Hypertension Father   . Prostate cancer Father   . Cancer Father        prostate  . Heart attack Father   . Breast cancer Paternal Aunt        metastasized   . Ovarian cancer Paternal Aunt     Social History   Socioeconomic History  . Marital status: Divorced    Spouse name: Not on file  . Number of children: 2  . Years of education: Not on file  . Highest education level: Not on file  Occupational History  . Occupation: CUSTOMER SERVICE    Employer: [employer]  Social Needs  . Financial resource strain: Not hard at all  . Food insecurity    Worry: Never true    Inability: Never true  .  Transportation needs    Medical: No    Non-medical: No  Tobacco Use  . Smoking status: Never Smoker  . Smokeless tobacco: Never Used  Substance and Sexual Activity  . Alcohol use: No    Alcohol/week: 0.0 standard drinks  . Drug use: No  . Sexual activity: Yes    Partners: Male    Birth control/protection: I.U.D.  Lifestyle  . Physical activity    Days per week: 2 days    Minutes per session: 60 min  . Stress: Not at all  Relationships  . Social connections    Talks on phone: More than three times a week    Gets together: More than three times a week    Attends religious service: More than 4 times per year    Active member of club or organization: Yes    Attends meetings of clubs or organizations: More than 4 times per year    Relationship status: Divorced  . Intimate partner violence    Fear of current or ex partner: No    Emotionally abused: No    Physically abused: No    Forced sexual activity: No  Other Topics  Concern  . Not on file  Social History Narrative   Some regular exercise.  No caffeine daily.     ROS Review of Systems Review of Systems  Constitutional: Negative for activity change, appetite change, chills and fever.  HENT: Negative for congestion, nosebleeds, trouble swallowing and voice change.   Respiratory: Negative for cough, shortness of breath and wheezing.   Gastrointestinal: Negative for diarrhea, nausea and vomiting.  Genitourinary: Negative for difficulty urinating, dysuria, flank pain and hematuria.  Musculoskeletal: Negative for back pain, joint swelling and neck pain.  Neurological: Negative for dizziness, speech difficulty, light-headedness and numbness.  See HPI. All other review of systems negative.   Objective:   Today's Vitals: BP 122/81 (BP Location: Right Arm, Patient Position: Sitting, Cuff Size: Large)   Pulse (!) 104   Temp 98.3 F (36.8 C) (Oral)   Resp 17   Ht 5' 7"  (1.702 m)   Wt 253 lb 3.2 oz (114.9 kg)   LMP 06/11/2011    SpO2 96%   BMI 39.66 kg/m   Physical Exam Physical Exam  Constitutional: Oriented to person, place, and time. Appears well-developed and well-nourished.  HENT:  Head: Normocephalic and atraumatic.  Eyes: Conjunctivae and EOM are normal.  Cardiovascular: Normal rate, regular rhythm, normal heart sounds and intact distal pulses.  No murmur heard. Pulmonary/Chest: Effort normal and breath sounds normal. No stridor. No respiratory distress. Has no wheezes.  Neurological: Is alert and oriented to person, place, and time.  Skin: Skin is warm. Capillary refill takes less than 2 seconds.  Psychiatric: Has a normal mood and affect. Behavior is normal. Judgment and thought content normal.   Assessment & Plan:   Tammy Roman was seen today for new patient (initial visit) and medication refill.  Diagnoses and all orders for this visit:  Type 2 diabetes mellitus with hyperglycemia, without long-term current use of insulin ([place])-  Will scan labs when received  -     HM Diabetes Foot Exam  Need for prophylactic vaccination and inoculation against influenza  IUD (intrauterine device) in place -  Stable, current  Morbid obesity ([place]) -  Discussed weight loss     Outpatient Encounter Medications as of 02/08/2019  Medication Sig  . aspirin 81 MG chewable tablet Chew by mouth daily.  . B-D ULTRA-FINE 33 LANCETS MISC   . blood glucose meter kit and supplies Dispense based on patient and insurance preference. Use up to four times daily as directed. (FOR ICD-9 250.00, 250.01).  Marland Kitchen glucose blood (ONE TOUCH ULTRA TEST) test strip   . levonorgestrel (MIRENA) 20 MCG/24HR IUD by Intrauterine route.  Marland Kitchen lisinopril (PRINIVIL,ZESTRIL) 2.5 MG tablet Take 2.5 mg by mouth daily.  Glory Rosebush DELICA LANCETS 65V MISC Use up to four times daily as directed. (FOR ICD-9 250.00, 250.01).  Marland Kitchen sitaGLIPtin-metformin (JANUMET) 50-1000 MG per tablet Take 1 tablet by mouth 2 (two) times daily with a meal.  . valACYclovir  (VALTREX) 500 MG tablet Take 1 tablet (500 mg total) by mouth daily.  Marland Kitchen azithromycin (ZITHROMAX) 500 MG tablet TAKE 1 TABLET (500 MG TOTAL) BY MOUTH DAILY. FOR 5 DAYS  . benzonatate (TESSALON) 200 MG capsule TAKE 1 CAPSULE (200 MG TOTAL) BY MOUTH THREE (3) TIMES A DAY AS NEEDED FOR COUGH. FOR UP TO 7 DAYS  . HYDROcodone-homatropine (HYCODAN) 5-1.5 MG/5ML syrup Take by mouth.  Marland Kitchen ibuprofen (ADVIL,MOTRIN) 800 MG tablet Take 800 mg by mouth every 8 (eight) hours as needed. For pain.   No facility-administered encounter medications  on file as of 02/08/2019.     Follow-up: No follow-ups on file.   Forrest Moron, MD

## 2019-02-10 ENCOUNTER — Other Ambulatory Visit (HOSPITAL_COMMUNITY): Payer: Self-pay | Admitting: Obstetrics & Gynecology

## 2019-02-10 ENCOUNTER — Other Ambulatory Visit: Payer: Self-pay

## 2019-02-10 ENCOUNTER — Telehealth: Payer: Self-pay | Admitting: Family Medicine

## 2019-02-10 DIAGNOSIS — E1165 Type 2 diabetes mellitus with hyperglycemia: Secondary | ICD-10-CM

## 2019-02-10 NOTE — Telephone Encounter (Signed)
Pt needs to pick up order for micro albumin - urine. She normally gets this on blue print fro [facility] .. [facility] . Wants to come by today or tomorrow to pick up   FR

## 2019-02-10 NOTE — Telephone Encounter (Signed)
Called and informed pt of lab ready to pick up, she verbalized understanding.

## 2019-02-11 LAB — MICROALBUMIN, URINE: Microalb, Ur: 0.4 mg/dL

## 2019-02-19 ENCOUNTER — Inpatient Hospital Stay (HOSPITAL_COMMUNITY): Admission: RE | Admit: 2019-02-19 | Payer: 59 | Source: Ambulatory Visit

## 2019-02-19 NOTE — Progress Notes (Signed)
Called and spoke w/ pt via phone about not getting  covid test today.  Pt stated that has been trying to call dr dove office because she needs to cancel and reschedule until Jan 2021, but she not be able to get anyone on the phone today.  Advised pt to call dr dove office Monday morning 02-22-2019 and I will call also to let them know the she has been trying to contact them to reschedule.

## 2019-02-23 ENCOUNTER — Encounter (HOSPITAL_BASED_OUTPATIENT_CLINIC_OR_DEPARTMENT_OTHER): Admission: RE | Payer: Self-pay | Source: Home / Self Care

## 2019-02-23 ENCOUNTER — Ambulatory Visit (HOSPITAL_BASED_OUTPATIENT_CLINIC_OR_DEPARTMENT_OTHER): Admission: RE | Admit: 2019-02-23 | Payer: 59 | Source: Home / Self Care | Admitting: Obstetrics & Gynecology

## 2019-02-23 SURGERY — SALPINGECTOMY, BILATERAL, LAPAROSCOPIC
Anesthesia: Choice | Laterality: Bilateral

## 2019-02-24 ENCOUNTER — Other Ambulatory Visit: Payer: Self-pay | Admitting: Family Medicine

## 2019-02-25 LAB — MICROALBUMIN, URINE: Microalb, Ur: 0.7 mg/dL

## 2019-03-17 LAB — MYVANTAGE HEREDITARY CANCER PANEL

## 2019-03-17 LAB — VUS

## 2019-03-18 ENCOUNTER — Encounter: Payer: Self-pay | Admitting: *Deleted

## 2019-07-15 ENCOUNTER — Telehealth: Payer: Self-pay | Admitting: Family Medicine

## 2019-07-15 NOTE — Telephone Encounter (Signed)
Copied from CRM [id number]. Topic: Quick Communication - Rx Refill/Question >> Jul 15, 2019  3:41 PM Jaquita Rector A wrote: Medication: valACYclovir (VALTREX) 500 MG tablet   Has the patient contacted their pharmacy? Yes.   (Agent: If no, request that the patient contact the pharmacy for the refill.) (Agent: If yes, when and what did the pharmacy advise?)  Preferred Pharmacy (with phone number or street name): [phone] - [place], [place] - [address]  Phone:  [phone] Fax:  [phone]     Agent: Please be advised that RX refills may take up to 3 business days. We ask that you follow-up with your pharmacy.

## 2019-07-21 ENCOUNTER — Other Ambulatory Visit: Payer: Self-pay | Admitting: Family Medicine

## 2019-07-22 ENCOUNTER — Other Ambulatory Visit: Payer: Self-pay

## 2019-07-22 ENCOUNTER — Other Ambulatory Visit: Payer: Self-pay | Admitting: Family Medicine

## 2019-07-22 DIAGNOSIS — E1165 Type 2 diabetes mellitus with hyperglycemia: Secondary | ICD-10-CM

## 2019-07-22 MED ORDER — JANUMET 50-1000 MG PO TABS: 1.0000 | ORAL_TABLET | Freq: Two times a day (BID) | ORAL | 0 refills | Status: DC

## 2019-07-22 MED ORDER — VALACYCLOVIR HCL 500 MG PO TABS: 500.0000 mg | ORAL_TABLET | Freq: Every day | ORAL | 12 refills | Status: DC

## 2019-07-22 MED ORDER — JANUMET 50-1000 MG PO TABS: 1.0000 | ORAL_TABLET | Freq: Two times a day (BID) | ORAL | 1 refills | Status: DC

## 2019-07-22 NOTE — Telephone Encounter (Signed)
Pt janument and valtrex sent over to pharmacy.  Pt notified and advised to keep appt on 08/09/19 with stallings.  Pt agreeable.

## 2019-07-22 NOTE — Telephone Encounter (Signed)
Pt calling to find out why her PCP, Dr. Creta Levin, will not RX this medication for her.

## 2019-07-22 NOTE — Telephone Encounter (Signed)
Please Advise

## 2019-07-22 NOTE — Telephone Encounter (Signed)
Patient is calling back for this request and added that she needed sitaGLIPtin-metformin (JANUMET) 50-1000 MG tablet  Tammy Roman will send to pharmacy on file (verified )

## 2019-07-27 ENCOUNTER — Other Ambulatory Visit: Payer: Self-pay

## 2019-07-27 ENCOUNTER — Telehealth: Payer: Self-pay | Admitting: Family Medicine

## 2019-07-27 DIAGNOSIS — E1165 Type 2 diabetes mellitus with hyperglycemia: Secondary | ICD-10-CM

## 2019-07-27 NOTE — Telephone Encounter (Signed)
Pt would like lab orders placed so she can have her labs drawn concerning a diabetic check. She also goes to [facility] to have her labs drawn. She would like this before her 08/09/19 appointment with Dr. Creta Levin. Please advise at [phone].

## 2019-07-27 NOTE — Telephone Encounter (Signed)
Labs ordered.

## 2019-08-09 ENCOUNTER — Ambulatory Visit: Payer: Managed Care, Other (non HMO) | Admitting: Family Medicine

## 2019-08-09 ENCOUNTER — Other Ambulatory Visit: Payer: Self-pay

## 2019-08-09 ENCOUNTER — Encounter: Payer: Self-pay | Admitting: Family Medicine

## 2019-08-09 VITALS — BP 117/79 | HR 95 | Temp 98.0°F | Resp 16 | Ht 69.0 in | Wt 258.0 lb

## 2019-08-09 DIAGNOSIS — E1165 Type 2 diabetes mellitus with hyperglycemia: Secondary | ICD-10-CM | POA: Diagnosis not present

## 2019-08-09 DIAGNOSIS — R030 Elevated blood-pressure reading, without diagnosis of hypertension: Secondary | ICD-10-CM

## 2019-08-09 LAB — POCT GLYCOSYLATED HEMOGLOBIN (HGB A1C): Hemoglobin A1C: 5.6 % (ref 4.0–5.6)

## 2019-08-09 MED ORDER — JANUMET 50-1000 MG PO TABS: 1.0000 | ORAL_TABLET | Freq: Two times a day (BID) | ORAL | 1 refills | Status: DC

## 2019-08-09 NOTE — Progress Notes (Signed)
Established Patient Office Visit  Subjective:  Patient ID: Tammy Roman, female    DOB: [birth year]  Age: 39 y.o. MRN: [id number]  CC:  Chief Complaint  Patient presents with  . Diabetes    6 month follow up  . Medication Refill    Janumet 90 day    HPI [name] presents for   Diabetes Mellitus: Patient presents for follow up of diabetes. Symptoms: hyperglycemia. Symptoms have stabilized. Patient denies hypoglycemia .  Evaluation to date has been included: hemoglobin A1C.  Home sugars: BGs consistently in an acceptable range. Treatment to date: more intensive attention to diet which has been effective, increased aerobic exercise which has been effective and Janumet.  Lab Results  Component Value Date   HGBA1C 5.6 08/09/2019    Lab Results  Component Value Date   HGBA1C 14.0 07/08/2014   Wt Readings from Last 3 Encounters:  08/09/19 258 lb (117 kg)  02/08/19 253 lb 3.2 oz (114.9 kg)  09/17/18 255 lb (115.7 kg)       Past Medical History:  Diagnosis Date  . Abnormal Pap smear of cervix    HPV  . Diabetes mellitus without complication ([place])   . HSV-2 (herpes simplex virus 2) infection   . Obesity       Past Surgical History:  Procedure Laterality Date  . WISDOM TOOTH EXTRACTION      Family History  Problem Relation Age of Onset  . Hyperlipidemia Mother   . Hypertension Mother   . Hyperlipidemia Father   . Hypertension Father   . Prostate cancer Father   . Cancer Father        prostate  . Heart attack Father   . Breast cancer Paternal Aunt        metastasized   . Ovarian cancer Paternal Aunt     Social History   Socioeconomic History  . Marital status: Divorced    Spouse name: Not on file  . Number of children: 2  . Years of education: Not on file  . Highest education level: Not on file  Occupational History  . Occupation: CUSTOMER SERVICE    Employer: [employer]  Tobacco Use  . Smoking status: Never Smoker  . Smokeless  tobacco: Never Used  Substance and Sexual Activity  . Alcohol use: No    Alcohol/week: 0.0 standard drinks  . Drug use: No  . Sexual activity: Yes    Partners: Male    Birth control/protection: I.U.D.  Other Topics Concern  . Not on file  Social History Narrative   Some regular exercise.  No caffeine daily.    Social Determinants of Health   Financial Resource Strain: Low Risk   . Difficulty of Paying Living Expenses: Not hard at all  Food Insecurity: No Food Insecurity  . Worried About Charity fundraiser in the Last Year: Never true  . Ran Out of Food in the Last Year: Never true  Transportation Needs: No Transportation Needs  . Lack of Transportation (Medical): No  . Lack of Transportation (Non-Medical): No  Physical Activity: Insufficiently Active  . Days of Exercise per Week: 2 days  . Minutes of Exercise per Session: 60 min  Stress: No Stress Concern Present  . Feeling of Stress : Not at all  Social Connections: Slightly Isolated  . Frequency of Communication with Friends and Family: More than three times a week  . Frequency of Social Gatherings with Friends and Family: More than three times  a week  . Attends Religious Services: More than 4 times per year  . Active Member of Clubs or Organizations: Yes  . Attends Archivist Meetings: More than 4 times per year  . Marital Status: Divorced  Human resources officer Violence: Not At Risk  . Fear of Current or Ex-Partner: No  . Emotionally Abused: No  . Physically Abused: No  . Sexually Abused: No    Outpatient Medications Prior to Visit  Medication Sig Dispense Refill  . aspirin 81 MG chewable tablet Chew by mouth daily.    Marland Kitchen atorvastatin (LIPITOR) 10 MG tablet Take 1 tablet (10 mg total) by mouth once a week. 12 tablet 3  . lisinopril (PRINIVIL,ZESTRIL) 2.5 MG tablet Take 2.5 mg by mouth daily.  3  . valACYclovir (VALTREX) 500 MG tablet Take 1 tablet (500 mg total) by mouth daily. 30 tablet 12  .  sitaGLIPtin-metformin (JANUMET) 50-1000 MG tablet Take 1 tablet by mouth 2 (two) times daily with a meal. 60 tablet 1  . B-D ULTRA-FINE 33 LANCETS MISC     . blood glucose meter kit and supplies Dispense based on patient and insurance preference. Use up to four times daily as directed. (FOR ICD 10  E11.65). 1 each 0  . glucose blood (ONE TOUCH ULTRA TEST) test strip     . levonorgestrel (MIRENA) 20 MCG/24HR IUD by Intrauterine route.    Glory Rosebush DELICA LANCETS 99M MISC Use up to four times daily as directed. (FOR ICD-9 250.00, 250.01). 100 each prn   No facility-administered medications prior to visit.    No Known Allergies  ROS Review of Systems    Objective:    Physical Exam  BP 117/79   Pulse 95   Temp 98 F (36.7 C) (Temporal)   Resp 16   Ht 5' 9"  (1.753 m)   Wt 258 lb (117 kg)   SpO2 98%   BMI 38.10 kg/m  Wt Readings from Last 3 Encounters:  08/09/19 258 lb (117 kg)  02/08/19 253 lb 3.2 oz (114.9 kg)  09/17/18 255 lb (115.7 kg)   Physical Exam  Constitutional: Oriented to person, place, and time. Appears well-developed and well-nourished.  HENT:  Head: Normocephalic and atraumatic.  Eyes: Conjunctivae and EOM are normal.  Cardiovascular: Normal rate, regular rhythm, normal heart sounds and intact distal pulses.  No murmur heard. Pulmonary/Chest: Effort normal and breath sounds normal. No stridor. No respiratory distress. Has no wheezes.  Neurological: Is alert and oriented to person, place, and time.  Skin: Skin is warm. Capillary refill takes less than 2 seconds.  Psychiatric: Has a normal mood and affect. Behavior is normal. Judgment and thought content normal.    Health Maintenance Due  Topic Date Due  . HEMOGLOBIN A1C  01/08/2015    There are no preventive care reminders to display for this patient.  Lab Results  Component Value Date   TSH 1.508 07/13/2014   Lab Results  Component Value Date   WBC 6.5 07/13/2014   HGB 13.6 07/13/2014   HCT  42.8 07/13/2014   MCV 83.3 07/13/2014   PLT 261 07/13/2014   Lab Results  Component Value Date   NA 138 08/29/2014   K 4.2 08/29/2014   CO2 25 08/29/2014   GLUCOSE 85 08/29/2014   BUN 11 08/29/2014   CREATININE 0.63 08/29/2014   BILITOT 0.2 08/29/2014   ALKPHOS 78 08/29/2014   AST 18 08/29/2014   ALT 19 08/29/2014   PROT 6.4 08/29/2014  ALBUMIN 4.0 08/29/2014   CALCIUM 8.8 08/29/2014   ANIONGAP 8 07/11/2014   Lab Results  Component Value Date   CHOL 144 07/13/2014   Lab Results  Component Value Date   HDL 39 (L) 07/13/2014   Lab Results  Component Value Date   LDLCALC 84 07/13/2014   Lab Results  Component Value Date   TRIG 105 07/13/2014   Lab Results  Component Value Date   CHOLHDL 3.7 07/13/2014   Lab Results  Component Value Date   HGBA1C 14.0 07/08/2014      Assessment & Plan:   Problem List Items Addressed This Visit      Other   ELEVATED BLOOD PRESSURE WITHOUT DIAGNOSIS OF HYPERTENSION  - resolved    Morbid obesity ([place]) - unchanged, advised exercise daily    Relevant Medications   sitaGLIPtin-metformin (JANUMET) 50-1000 MG tablet    Other Visit Diagnoses    Type 2 diabetes mellitus with hyperglycemia, without long-term current use of insulin ([place])    -  Well controlled, continue current meds, reviewed diabetes standard of care   Primary   Relevant Medications   sitaGLIPtin-metformin (JANUMET) 50-1000 MG tablet   Other Relevant Orders   POCT glycosylated hemoglobin (Hb A1C)   Comprehensive metabolic panel   Lipid panel      Meds ordered this encounter  Medications  . sitaGLIPtin-metformin (JANUMET) 50-1000 MG tablet    Sig: Take 1 tablet by mouth 2 (two) times daily with a meal.    Dispense:  60 tablet    Refill:  1    Follow-up: No follow-ups on file.    Forrest Moron, MD

## 2019-08-09 NOTE — Patient Instructions (Signed)
° ° ° °  If you have lab work done today you will be contacted with your lab results within the next 2 weeks.  If you have not heard from us then please contact us. The fastest way to get your results is to register for My Chart. ° ° °IF you received an x-ray today, you will receive an invoice from [facility]. Please contact  Radiology at [phone] with questions or concerns regarding your invoice.  ° °IF you received labwork today, you will receive an invoice from [facility]. Please contact [facility] at [phone] with questions or concerns regarding your invoice.  ° °Our billing staff will not be able to assist you with questions regarding bills from these companies. ° °You will be contacted with the lab results as soon as they are available. The fastest way to get your results is to activate your My Chart account. Instructions are located on the last page of this paperwork. If you have not heard from us regarding the results in 2 weeks, please contact this office. °  ° ° ° °

## 2019-08-10 LAB — COMPREHENSIVE METABOLIC PANEL
AG Ratio: 1.6 (calc) (ref 1.0–2.5)
ALT: 28 U/L (ref 6–29)
AST: 15 U/L (ref 10–30)
Albumin: 4.1 g/dL (ref 3.6–5.1)
Alkaline phosphatase (APISO): 83 U/L (ref 31–125)
BUN: 12 mg/dL (ref 7–25)
CO2: 24 mmol/L (ref 20–32)
Calcium: 9.2 mg/dL (ref 8.6–10.2)
Chloride: 104 mmol/L (ref 98–110)
Creat: 0.78 mg/dL (ref 0.50–1.10)
Globulin: 2.5 g/dL (calc) (ref 1.9–3.7)
Glucose, Bld: 102 mg/dL — ABNORMAL HIGH (ref 65–99)
Potassium: 4.5 mmol/L (ref 3.5–5.3)
Sodium: 138 mmol/L (ref 135–146)
Total Bilirubin: 0.3 mg/dL (ref 0.2–1.2)
Total Protein: 6.6 g/dL (ref 6.1–8.1)

## 2019-08-10 LAB — LIPID PANEL
Cholesterol: 154 mg/dL (ref <200)
HDL: 50 mg/dL (ref >=50)
LDL Cholesterol (Calc): 88 mg/dL (calc)
Non-HDL Cholesterol (Calc): 104 mg/dL (calc) (ref <130)
Total CHOL/HDL Ratio: 3.1 (calc) (ref <5.0)
Triglycerides: 74 mg/dL (ref <150)

## 2019-08-17 NOTE — Telephone Encounter (Signed)
Called pt she said she will follow her PCP

## 2019-10-14 ENCOUNTER — Other Ambulatory Visit: Payer: Self-pay

## 2019-10-14 DIAGNOSIS — E1165 Type 2 diabetes mellitus with hyperglycemia: Secondary | ICD-10-CM

## 2019-10-14 MED ORDER — JANUMET 50-1000 MG PO TABS: 1.0000 | ORAL_TABLET | Freq: Two times a day (BID) | ORAL | 1 refills | Status: DC

## 2020-05-25 ENCOUNTER — Ambulatory Visit: Payer: 59 | Admitting: Obstetrics and Gynecology

## 2020-06-13 ENCOUNTER — Other Ambulatory Visit: Payer: Self-pay

## 2020-06-13 ENCOUNTER — Encounter: Payer: Self-pay | Admitting: Certified Nurse Midwife

## 2020-06-13 ENCOUNTER — Other Ambulatory Visit (HOSPITAL_COMMUNITY)
Admission: RE | Admit: 2020-06-13 | Discharge: 2020-06-13 | Disposition: A | Discharge status: Home / Self Care | Payer: 59 | Source: Ambulatory Visit | Attending: Certified Nurse Midwife | Admitting: Certified Nurse Midwife

## 2020-06-13 ENCOUNTER — Ambulatory Visit (INDEPENDENT_AMBULATORY_CARE_PROVIDER_SITE_OTHER): Payer: 59 | Admitting: Certified Nurse Midwife

## 2020-06-13 VITALS — BP 124/84 | HR 91 | Resp 16 | Ht 67.0 in | Wt 268.0 lb

## 2020-06-13 DIAGNOSIS — N760 Acute vaginitis: Secondary | ICD-10-CM | POA: Insufficient documentation

## 2020-06-13 DIAGNOSIS — Z975 Presence of (intrauterine) contraceptive device: Secondary | ICD-10-CM

## 2020-06-13 DIAGNOSIS — Z01419 Encounter for gynecological examination (general) (routine) without abnormal findings: Secondary | ICD-10-CM | POA: Diagnosis present

## 2020-06-13 DIAGNOSIS — N939 Abnormal uterine and vaginal bleeding, unspecified: Secondary | ICD-10-CM | POA: Diagnosis not present

## 2020-06-13 DIAGNOSIS — Z113 Encounter for screening for infections with a predominantly sexual mode of transmission: Secondary | ICD-10-CM | POA: Insufficient documentation

## 2020-06-13 DIAGNOSIS — B9689 Other specified bacterial agents as the cause of diseases classified elsewhere: Secondary | ICD-10-CM | POA: Insufficient documentation

## 2020-06-13 DIAGNOSIS — Z803 Family history of malignant neoplasm of breast: Secondary | ICD-10-CM

## 2020-06-13 NOTE — Progress Notes (Signed)
GYNECOLOGY ANNUAL PREVENTATIVE CARE ENCOUNTER NOTE  History:     Tammy Roman is a 40 y.o. G41P2002 female here for a routine annual gynecologic exam.  Current complaints: AUB.   Denies discharge, pelvic pain, problems with intercourse or other gynecologic concerns.  Patient request STD screening and pap smear today.   Gynecologic History No LMP recorded. (Menstrual status: IUD). Contraception: IUD Last Pap: 09/2018. Results were: normal with negative HPV  Obstetric History OB History  Gravida Para Term Preterm AB Living  _0 SAB IAB Ectopic Multiple Live Births          1    # Outcome Date GA Lbr Len/2nd Weight Sex Delivery Anes PTL Lv  2 Term 05/23/12 72w4d05:15 / 00:27 8 lb 9 oz (3.884 kg) M Vag-Spont EPI  LIV  1 Term 01/22/03 474w0d7 lb 14 oz (3.572 kg) F Vag-Spont       Past Medical History:  Diagnosis Date  . Abnormal Pap smear of cervix    HPV  . Diabetes mellitus without complication ([place]  . HSV-2 (herpes simplex virus 2) infection   . Obesity     Past Surgical History:  Procedure Laterality Date  . WISDOM TOOTH EXTRACTION      Current Outpatient Medications on File Prior to Visit  Medication Sig Dispense Refill  . aspirin 81 MG chewable tablet Chew by mouth daily.    . Marland Kitchentorvastatin (LIPITOR) 10 MG tablet Take 1 tablet (10 mg total) by mouth once a week. 12 tablet 3  . B-D ULTRA-FINE 33 LANCETS MISC     . blood glucose meter kit and supplies Dispense based on patient and insurance preference. Use up to four times daily as directed. (FOR ICD 10  E11.65). 1 each 0  . glucose blood test strip     . levonorgestrel (MIRENA) 20 MCG/24HR IUD by Intrauterine route.    . Marland Kitchenisinopril (PRINIVIL,ZESTRIL) 2.5 MG tablet Take 2.5 mg by mouth daily.  3  . ONETOUCH DELICA LANCETS 3303BISC Use up to four times daily as directed. (FOR ICD-9 250.00, 250.01). 100 each prn  . sitaGLIPtin-metformin (JANUMET) 50-1000 MG tablet Take 1 tablet by mouth 2 (two) times  daily with a meal. 180 tablet 1  . valACYclovir (VALTREX) 500 MG tablet Take 1 tablet (500 mg total) by mouth daily. 30 tablet 12   No current facility-administered medications on file prior to visit.    No Known Allergies  Social History:  reports that she has never smoked. She has never used smokeless tobacco. She reports that she does not drink alcohol and does not use drugs.  Family History  Problem Relation Age of Onset  . Hyperlipidemia Mother   . Hypertension Mother   . Hyperlipidemia Father   . Hypertension Father   . Prostate cancer Father   . Cancer Father        prostate  . Heart attack Father   . Breast cancer Paternal Aunt        metastasized   . Ovarian cancer Paternal Aunt     The following portions of the patient's history were reviewed and updated as appropriate: allergies, current medications, past family history, past medical history, past social history, past surgical history and problem list.  Review of Systems Pertinent items noted in HPI and remainder of comprehensive ROS otherwise negative.  Physical Exam:  BP 124/84   Pulse 91   Resp 16  Ht _0  (1.702 m)   Wt 268 lb (121.6 kg)   BMI 41.97 kg/m  CONSTITUTIONAL: Well-developed, obese female in no acute distress.  HENT:  Normocephalic, atraumatic, External right and left ear normal.  EYES: Conjunctivae and EOM are normal. Pupils are equal, round, and reactive to light. NECK: Normal range of motion, supple, no masses.  Normal thyroid.  SKIN: Skin is warm and dry. No rash noted. Not diaphoretic. No erythema. No pallor. MUSCULOSKELETAL: Normal range of motion. No tenderness.  No cyanosis, clubbing, or edema.   NEUROLOGIC: Alert and oriented to person, place, and time. Normal reflexes, muscle tone coordination.  PSYCHIATRIC: Normal mood and affect. Normal behavior. Normal judgment and thought content. CARDIOVASCULAR: Normal heart rate noted, regular rhythm RESPIRATORY: Clear to auscultation  bilaterally. Effort and breath sounds normal, no problems with respiration noted. BREASTS: Symmetric in size. No masses, tenderness, skin changes, nipple drainage, or lymphadenopathy bilaterally. Performed in the presence of a chaperone. ABDOMEN: Soft, no distention noted.  No tenderness, rebound or guarding.  PELVIC: Normal appearing external genitalia and urethral meatus; normal appearing vaginal mucosa and cervix.  No abnormal discharge noted. IUD string seen.  Pap smear obtained.  Normal uterine size, no other palpable masses, no uterine or adnexal tenderness.  Performed in the presence of a chaperone.   Assessment and Plan:    1. Well woman exam - Cytology - PAP( [facility]) - Cervicovaginal ancillary only( )  2. Screening for STDs (sexually transmitted diseases) - Patient request full STD screening today - HIV Antibody (routine testing w rflx) - RPR - Hepatitis C Antibody - Hepatitis B Surface AntiGEN  3. Family history of breast cancer - MM Digital Screening; Future  4. Abnormal uterine bleeding (AUB) - Patient reports AUB with IUD, reports vaginal bleeding for 2 weeks with cycle  - Denies hx of fibroids or abnormal [place], will repeat US to assess  - US Pelvis Complete; Future  5. IUD (intrauterine device) in place - IUD strings in place   Will follow up results of pap smear and manage accordingly. Mammogram ordered due to fam hx of breast cancer  Routine preventative health maintenance measures emphasized. Please refer to After Visit Summary for other counseling recommendations.      Lajean Manes, [place] for [employer], [place]

## 2020-06-14 ENCOUNTER — Ambulatory Visit (INDEPENDENT_AMBULATORY_CARE_PROVIDER_SITE_OTHER): Payer: 59

## 2020-06-14 DIAGNOSIS — N939 Abnormal uterine and vaginal bleeding, unspecified: Secondary | ICD-10-CM

## 2020-06-14 DIAGNOSIS — Z1231 Encounter for screening mammogram for malignant neoplasm of breast: Secondary | ICD-10-CM

## 2020-06-14 DIAGNOSIS — Z803 Family history of malignant neoplasm of breast: Secondary | ICD-10-CM

## 2020-06-14 LAB — CYTOLOGY - PAP
Comment: NEGATIVE
Diagnosis: NEGATIVE
High risk HPV: NEGATIVE

## 2020-06-14 LAB — CERVICOVAGINAL ANCILLARY ONLY
Bacterial Vaginitis (gardnerella): POSITIVE — AB
Candida Glabrata: NEGATIVE
Candida Vaginitis: NEGATIVE
Chlamydia: NEGATIVE
Comment: NEGATIVE
Comment: NEGATIVE
Comment: NEGATIVE
Comment: NEGATIVE
Comment: NEGATIVE
Comment: NORMAL
Neisseria Gonorrhea: NEGATIVE
Trichomonas: NEGATIVE

## 2020-06-15 LAB — HEPATITIS B SURFACE ANTIGEN: Hepatitis B Surface Ag: NONREACTIVE

## 2020-06-15 LAB — RPR: RPR Ser Ql: NONREACTIVE

## 2020-06-15 LAB — HIV ANTIBODY (ROUTINE TESTING W REFLEX): HIV 1&2 Ab, 4th Generation: NONREACTIVE

## 2020-06-15 LAB — HEPATITIS C ANTIBODY
Hepatitis C Ab: NONREACTIVE
SIGNAL TO CUT-OFF: 0.02 (ref <1.00)

## 2021-07-06 ENCOUNTER — Other Ambulatory Visit: Payer: Self-pay

## 2021-07-06 ENCOUNTER — Ambulatory Visit (INDEPENDENT_AMBULATORY_CARE_PROVIDER_SITE_OTHER): Payer: 59 | Admitting: Obstetrics and Gynecology

## 2021-07-06 ENCOUNTER — Encounter: Payer: Self-pay | Admitting: Obstetrics and Gynecology

## 2021-07-06 VITALS — BP 141/88 | HR 100 | Ht 67.0 in | Wt 260.0 lb

## 2021-07-06 DIAGNOSIS — Z01419 Encounter for gynecological examination (general) (routine) without abnormal findings: Secondary | ICD-10-CM

## 2021-07-06 NOTE — Progress Notes (Signed)
? ? ?GYNECOLOGY ANNUAL PREVENTATIVE CARE ENCOUNTER NOTE ? ?History:    ? Tammy Roman is a 41 y.o. G46P2002 female here for a routine annual gynecologic exam.  Current complaints: None  Denies abnormal vaginal bleeding, discharge, pelvic pain, problems with intercourse or other gynecologic concerns.  ?  ?Considering oral BC once IUD is experired. We discussed OCP's vs POP's.  ?Denies Hx of HTN. She is not sure why this is on her chart. She was taking lisinpril in the past for kidney protection 2/2 DM.  ? ?Gynecologic History ?No LMP recorded. (Menstrual status: IUD). ?Contraception: IUD ?Last Pap: 2022.  Result was normal with negative HPV ?Last Mammogram: 2022.  Result was normal- Family history of breast CA- paternal  ?Last Colonoscopy: NA ?Obstetric History ?OB History  ?Gravida Para Term Preterm AB Living  ?2 2 2     2   ?SAB IAB Ectopic Multiple Live Births  ?        1  ?  ?# Outcome Date GA Lbr Len/2nd Weight Sex Delivery Anes PTL Lv  ?2 Term 05/23/12 55w4d05:15 / 00:27 8 lb 9 oz (3.884 kg) M Vag-Spont EPI  LIV  ?1 Term 01/22/03 466w0d7 lb 14 oz (3.572 kg) F Vag-Spont     ? ? ?Past Medical History:  ?Diagnosis Date  ? Abnormal Pap smear of cervix   ? HPV  ? Diabetes mellitus without complication ([place]  ? HSV-2 (herpes simplex virus 2) infection   ? Obesity   ? ? ?Past Surgical History:  ?Procedure Laterality Date  ? WISDOM TOOTH EXTRACTION    ? ? ?Current Outpatient Medications on File Prior to Visit  ?Medication Sig Dispense Refill  ? levonorgestrel (MIRENA) 20 MCG/24HR IUD by Intrauterine route.    ? metFORMIN (GLUCOPHAGE) 500 MG tablet Take by mouth 2 (two) times daily with a meal.    ? Semaglutide,0.25 or 0.5MG/DOS, (OZEMPIC, 0.25 OR 0.5 MG/DOSE,) 2 MG/1.5ML SOPN Inject into the skin.    ? aspirin 81 MG chewable tablet Chew by mouth daily. (Patient not taking: Reported on 07/06/2021)    ? atorvastatin (LIPITOR) 10 MG tablet Take 1 tablet (10 mg total) by mouth once a week. (Patient not taking:  Reported on 07/06/2021) 12 tablet 3  ? B-D ULTRA-FINE 33 LANCETS MISC  (Patient not taking: Reported on 07/06/2021)    ? blood glucose meter kit and supplies Dispense based on patient and insurance preference. Use up to four times daily as directed. (FOR ICD 10  E11.65). (Patient not taking: Reported on 07/06/2021) 1 each 0  ? glucose blood test strip  (Patient not taking: Reported on 07/06/2021)    ? lisinopril (PRINIVIL,ZESTRIL) 2.5 MG tablet Take 2.5 mg by mouth daily. (Patient not taking: Reported on 07/06/2021)  3  ? ONETOUCH DELICA LANCETS 3370VISC Use up to four times daily as directed. (FOR ICD-9 250.00, 250.01). (Patient not taking: Reported on 07/06/2021) 100 each prn  ? sitaGLIPtin-metformin (JANUMET) 50-1000 MG tablet Take 1 tablet by mouth 2 (two) times daily with a meal. (Patient not taking: Reported on 07/06/2021) 180 tablet 1  ? valACYclovir (VALTREX) 500 MG tablet Take 1 tablet (500 mg total) by mouth daily. (Patient not taking: Reported on 07/06/2021) 30 tablet 12  ? ?No current facility-administered medications on file prior to visit.  ? ? ?No Known Allergies ? ?Social History:  reports that she has never smoked. She has never used smokeless tobacco. She reports that she does not drink alcohol  and does not use drugs. ? ?Family History  ?Problem Relation Age of Onset  ? Hyperlipidemia Mother   ? Hypertension Mother   ? Hyperlipidemia Father   ? Hypertension Father   ? Prostate cancer Father   ? Cancer Father   ?     prostate  ? Heart attack Father   ? Breast cancer Paternal Aunt   ?     metastasized   ? Ovarian cancer Paternal Aunt   ? ? ?The following portions of the patient's history were reviewed and updated as appropriate: allergies, current medications, past family history, past medical history, past social history, past surgical history and problem list. ? ?Review of Systems ?Pertinent items noted in HPI and remainder of comprehensive ROS otherwise negative. ? ?Physical Exam:  ?BP (!) 141/88   Pulse 100    Ht 5' 7"  (1.702 m)   Wt 260 lb (117.9 kg)   BMI 40.72 kg/m?  ?CONSTITUTIONAL: Well-developed, well-nourished female in no acute distress.  ?HENT:  Normocephalic, atraumatic, External right and left ear normal.  ?EYES: Conjunctivae and EOM are normal. Pupils are equal, round, and reactive to light. No scleral icterus.  ?NECK: Normal range of motion, supple, no masses.  Normal thyroid.  ?SKIN: Skin is warm and dry. No rash noted. Not diaphoretic. No erythema. No pallor. ?MUSCULOSKELETAL: Normal range of motion. No tenderness.  No cyanosis, clubbing, or edema. ?NEUROLOGIC: Alert and oriented to person, place, and time. Normal reflexes, muscle tone coordination.  ?PSYCHIATRIC: Normal mood and affect. Normal behavior. Normal judgment and thought content. ?CARDIOVASCULAR: Normal heart rate noted, regular rhythm ?RESPIRATORY: Clear to auscultation bilaterally. Effort and breath sounds normal, no problems with respiration noted. ?BREASTS: Symmetric in size. No masses, tenderness, skin changes, nipple drainage, or lymphadenopathy bilaterally. Performed in the presence of a chaperone. ?ABDOMEN: Soft, no distention noted.  No tenderness, rebound or guarding.  ?PELVIC: Normal appearing external genitalia and urethral meatus,  Normal uterine size, no other palpable masses, no uterine or adnexal tenderness.  IUD string palpated Performed in the presence of a chaperone. ?  ?Assessment and Plan:  ? ?1. Women's annual routine gynecological examination ? ?- MM Digital Screening; Future  ? ? ? ?Mammogram scheduled ?Routine preventative health maintenance measures emphasized. ?Please refer to After Visit Summary for other counseling recommendations.  ? ? ?Sarahlynn Cisnero, Artist Pais, NP ?Faculty Practice ?[facility] [place]  ?

## 2021-07-11 ENCOUNTER — Other Ambulatory Visit: Payer: Self-pay

## 2021-07-11 ENCOUNTER — Ambulatory Visit (INDEPENDENT_AMBULATORY_CARE_PROVIDER_SITE_OTHER): Payer: 59

## 2021-07-11 DIAGNOSIS — Z1231 Encounter for screening mammogram for malignant neoplasm of breast: Secondary | ICD-10-CM | POA: Diagnosis not present

## 2021-07-11 DIAGNOSIS — Z01419 Encounter for gynecological examination (general) (routine) without abnormal findings: Secondary | ICD-10-CM

## 2021-07-18 ENCOUNTER — Telehealth: Payer: Self-pay

## 2021-07-18 NOTE — Telephone Encounter (Signed)
Pt left voicemail on office phone asking for appointment this week due to bleeding with IUD. Called pt back. We have no availability until 3/21 at 11:10am. Pt was scheduled for then. Pt was told she can call PCP for possible sooner appt. I also told pt I will call her if we have any cancellations. I also recommended pt go to an urgent care if bleeding increases. Pt expressed understanding.  ?

## 2021-07-24 ENCOUNTER — Ambulatory Visit: Payer: 59 | Admitting: Advanced Practice Midwife

## 2021-07-24 NOTE — Progress Notes (Deleted)
? ?  GYNECOLOGY PROGRESS NOTE ? ?History:  ?41 y.o. G2P2002 presents to [facility] office today for problem gyn visit. She reports bleeding with IUD.   ? ?She denies h/a, dizziness, shortness of breath, n/v, or fever/chills.   ? ?The following portions of the patient's history were reviewed and updated as appropriate: allergies, current medications, past family history, past medical history, past social history, past surgical history and problem list. Last pap smear on *** was normal, *** HRHPV. ? ?Health Maintenance Due  ?Topic Date Due  ? COVID-19 Vaccine (1) Never done  ? OPHTHALMOLOGY EXAM  10/21/2019  ? FOOT EXAM  02/08/2020  ? HEMOGLOBIN A1C  02/08/2020  ? TETANUS/TDAP  04/10/2020  ? INFLUENZA VACCINE  12/04/2020  ?  ? ?Review of Systems:  ?Pertinent items are noted in HPI. ?  ?Objective:  ?Physical Exam ?There were no vitals taken for this visit. ?VS reviewed, nursing note reviewed,  ?Constitutional: well developed, well nourished, no distress ?HEENT: normocephalic ?CV: normal rate ?Pulm/chest wall: normal effort ?Breast Exam: deferred ?Abdomen: soft ?Neuro: alert and oriented x 3 ?Skin: warm, dry ?Psych: affect normal ?Pelvic exam: Cervix pink, visually closed, without lesion, scant white creamy discharge, vaginal walls and external genitalia normal ?Bimanual exam: Cervix 0/long/high, firm, anterior, neg CMT, uterus nontender, nonenlarged, adnexa without tenderness, enlargement, or mass ? ?Assessment & Plan:  ?There are no diagnoses linked to this encounter. ? ?Sharen Counter, CNM ?8:42 AM  ?

## 2022-02-04 ENCOUNTER — Other Ambulatory Visit (HOSPITAL_BASED_OUTPATIENT_CLINIC_OR_DEPARTMENT_OTHER): Payer: Self-pay

## 2022-02-04 MED ORDER — OZEMPIC (1 MG/DOSE) 4 MG/3ML ~~LOC~~ SOPN
1.0000 mg | PEN_INJECTOR | SUBCUTANEOUS | 3 refills | Status: AC
  Filled 2022-02-04: qty 3, 28d supply, fill #0

## 2022-02-07 ENCOUNTER — Other Ambulatory Visit (HOSPITAL_BASED_OUTPATIENT_CLINIC_OR_DEPARTMENT_OTHER): Payer: Self-pay

## 2022-02-18 ENCOUNTER — Ambulatory Visit: Payer: 59 | Admitting: Obstetrics and Gynecology

## 2022-03-13 ENCOUNTER — Other Ambulatory Visit (HOSPITAL_BASED_OUTPATIENT_CLINIC_OR_DEPARTMENT_OTHER): Payer: Self-pay

## 2022-03-13 MED ORDER — OZEMPIC (1 MG/DOSE) 4 MG/3ML ~~LOC~~ SOPN
1.0000 mg | PEN_INJECTOR | SUBCUTANEOUS | 5 refills | Status: AC
  Filled 2022-03-13: qty 3, 28d supply, fill #0
  Filled 2022-04-15: qty 3, 28d supply, fill #1
  Filled 2022-05-14: qty 3, 28d supply, fill #2
  Filled 2022-06-18: qty 3, 28d supply, fill #3
  Filled 2022-07-18: qty 3, 28d supply, fill #4

## 2022-04-15 ENCOUNTER — Other Ambulatory Visit (HOSPITAL_BASED_OUTPATIENT_CLINIC_OR_DEPARTMENT_OTHER): Payer: Self-pay

## 2022-04-19 ENCOUNTER — Other Ambulatory Visit (HOSPITAL_BASED_OUTPATIENT_CLINIC_OR_DEPARTMENT_OTHER): Payer: Self-pay

## 2022-04-24 ENCOUNTER — Other Ambulatory Visit (HOSPITAL_COMMUNITY)
Admission: RE | Admit: 2022-04-24 | Discharge: 2022-04-24 | Disposition: A | Discharge status: Home / Self Care | Payer: 59 | Source: Ambulatory Visit | Attending: Obstetrics and Gynecology | Admitting: Obstetrics and Gynecology

## 2022-04-24 ENCOUNTER — Encounter: Payer: Self-pay | Admitting: Obstetrics and Gynecology

## 2022-04-24 ENCOUNTER — Ambulatory Visit: Payer: 59 | Admitting: Obstetrics and Gynecology

## 2022-04-24 VITALS — BP 138/92 | HR 102 | Ht 67.0 in | Wt 260.0 lb

## 2022-04-24 DIAGNOSIS — Z30432 Encounter for removal of intrauterine contraceptive device: Secondary | ICD-10-CM | POA: Diagnosis not present

## 2022-04-24 DIAGNOSIS — Z113 Encounter for screening for infections with a predominantly sexual mode of transmission: Secondary | ICD-10-CM | POA: Insufficient documentation

## 2022-04-24 DIAGNOSIS — R102 Pelvic and perineal pain: Secondary | ICD-10-CM

## 2022-04-24 DIAGNOSIS — N898 Other specified noninflammatory disorders of vagina: Secondary | ICD-10-CM | POA: Diagnosis present

## 2022-04-24 DIAGNOSIS — R87810 Cervical high risk human papillomavirus (HPV) DNA test positive: Secondary | ICD-10-CM

## 2022-04-24 LAB — POCT URINALYSIS DIPSTICK
Bilirubin, UA: NEGATIVE
Glucose, UA: NEGATIVE
Ketones, UA: NEGATIVE
Leukocytes, UA: NEGATIVE
Nitrite, UA: NEGATIVE
Protein, UA: NEGATIVE
Spec Grav, UA: 1.025 (ref 1.010–1.025)
Urobilinogen, UA: NEGATIVE E.U./dL — AB
pH, UA: 6.5 (ref 5.0–8.0)

## 2022-04-24 LAB — POCT URINE PREGNANCY: Preg Test, Ur: NEGATIVE

## 2022-04-24 NOTE — Patient Instructions (Signed)
I will send you a MyChart message when your ultrasound results are back. If your pain goes away, it is ok to cancel (just call the ultrasound department to let them know you want to cancel).  Helpful things for constipation  - Kiwi fruit with skin on (only 1 per day!) or yellow dragonfruit - Increase fiber in diet and drink lots of water - Over the counter laxatives - Miralax or senna daily. Senna can cause more cramping, so Miralax might be most helpful. Drink with lots of water or you can get dehydrated  Some cramping and spotting is expected today. Please call if you are saturating more than 1-2 pads an hour for 2+ hours, have severe pain not alleviated with over the counter pain medication or feel like you're going to pass out.   Your next pap is due in 2025.

## 2022-04-24 NOTE — Progress Notes (Signed)
RETURN GYNECOLOGY VISIT  Subjective:  Tammy Roman is a 41 y.o. [phone] with IUD in place presenting for lower abdominal pain and vaginal itching  Dull, achy lower abdominal pain x 3 weeks. Associated constipation. Tolerating PO, no f/c.  Had concerns about something coming out of her vagina w/ associated pressure.  Is worried IUD is contributing. Has been in place for almost 5 years and she's started getting her period back with some spotting. Had vaginal itching but no discharge or odor that has resolved spontaneously.  PMH: DM, elevated PSH: dental Meds: metformin & ozempic Pap Hx: 06/13/2020  Cytology - PAP( [place])  *Pap Smear: NILM *HPV: HRHPV -  09/17/2018  Cytology - PAP( [place])  *Pap Smear: NILM *HPV: HRHPV -  09/18/2016  Cytology - PAP  *Pap Smear: NILM *HPV: HRHPV -  06/14/2015  Cytology - PAP  *Pap Smear: NILM *HPV: HRHPV -  07/13/2014  Cytology - PAP  *Pap Smear: NILM *HPV: HRHPV +, HRHPV 16 -, HRHPV 18 - Important   07/07/2013  Cytology - PAP  *Pap Smear: NILM *HPV: HRHPV -  04/02/2011  Surgical pathology  *Endocervical Curettage: Negative  03/05/2011  Cytology - PAP  *Pap Smear: LSIL Important    Objective:   Vitals:   04/24/22 0921  BP: (!) 138/92  Pulse: (!) 102  Weight: 260 lb (117.9 kg)  Height: 5\' 7"  (1.702 m)   General:  Alert, oriented and cooperative. Patient is in no acute distress.  Skin: Skin is warm and dry. No rash noted.   Cardiovascular: Normal heart rate noted  Respiratory: Normal respiratory effort, no problems with respiration noted  Abdomen: Soft, mildly tender in RLQ without rebound or guarding       Pelvic: NEFG, cervix normal appearance and texture with IUD strings visible. Uterus normal size, mobile, nontender. No palpable adnexal masses. RLQ mildly tender without rebound or guarding.  Split speculum exam with no evidence of pelvic organ prolapse   Assessment and Plan:  Tammy Roman is a 41  y.o. with pelvic pain and desire for IUD removal  1. Pelvic pain Reviewed focused ddx - constipation, cyst, UTI. No e/o PID, ectopic, or torsion on exam. No POP on exam. Pt worried IUD could be contributing UPT negative, UA negative LE/nitrite GC/CT/trich, BV/yeast swab collected Pelvic 41  IUD removed per patient preference (see procedure note below) Discussed management of constipation - Cervicovaginal ancillary only( [place]) - US PELVIC COMPLETE WITH TRANSVAGINAL; Future - POCT Urinalysis Dipstick - POCT urine pregnancy  2. Encounter for IUD removal Discussed that IUD can be maintained for 8 years for pregnancy prevention, but that the hormone effect on her periods will diminish after 5 years. Discussed that it was common to have more spotting/bleeding at this point in her IUD's lifetime She desires IUD removal today. We discussed back up contraception, but given her BP at this visit would not recommend COCs. Her partner has had a vasectomy, so she will let us know if she desires any new methods IUD removed without difficulty. See procedure note below   Return if symptoms worsen or fail to improve.  [place], MD   GYNECOLOGY OFFICE PROCEDURE NOTE  Tammy Roman is a 41 y.o. [phone] here for IUD removal. Last pap smear was on 06/13/20 and was normal.  IUD Removal  Patient identified, informed consent performed, consent signed.  Patient was in the dorsal lithotomy position, normal external genitalia was noted.  A speculum  was placed in the patient's vagina, normal discharge was noted, no lesions. The cervix was visualized, no lesions, no abnormal discharge.  The strings of the IUD were grasped and pulled using ring forceps. The IUD was removed in its entirety.   Patient tolerated the procedure well.  Post procedure precautions and instructions were given.   Harvie Bridge, MD Obstetrician & Gynecologist, [facility] for [employer], [facility]

## 2022-04-25 LAB — CERVICOVAGINAL ANCILLARY ONLY
Bacterial Vaginitis (gardnerella): POSITIVE — AB
Candida Glabrata: NEGATIVE
Candida Vaginitis: NEGATIVE
Chlamydia: NEGATIVE
Comment: NEGATIVE
Comment: NEGATIVE
Comment: NEGATIVE
Comment: NEGATIVE
Comment: NEGATIVE
Comment: NORMAL
Neisseria Gonorrhea: NEGATIVE
Trichomonas: POSITIVE — AB

## 2022-04-25 MED ORDER — METRONIDAZOLE 500 MG PO TABS: 500.0000 mg | ORAL_TABLET | Freq: Two times a day (BID) | ORAL | 0 refills | Status: DC

## 2022-04-25 NOTE — Addendum Note (Signed)
Addended by: Harvie Bridge on: 04/25/2022 05:46 PM   Modules accepted: Orders

## 2022-05-07 ENCOUNTER — Other Ambulatory Visit: Payer: Managed Care, Other (non HMO)

## 2022-05-24 ENCOUNTER — Ambulatory Visit (INDEPENDENT_AMBULATORY_CARE_PROVIDER_SITE_OTHER): Payer: 59

## 2022-05-24 DIAGNOSIS — R102 Pelvic and perineal pain: Secondary | ICD-10-CM | POA: Diagnosis not present

## 2022-06-22 ENCOUNTER — Other Ambulatory Visit (HOSPITAL_BASED_OUTPATIENT_CLINIC_OR_DEPARTMENT_OTHER): Payer: Self-pay

## 2022-07-23 ENCOUNTER — Other Ambulatory Visit (HOSPITAL_BASED_OUTPATIENT_CLINIC_OR_DEPARTMENT_OTHER): Payer: Self-pay

## 2022-07-23 MED ORDER — OZEMPIC (2 MG/DOSE) 8 MG/3ML ~~LOC~~ SOPN
2.0000 mg | PEN_INJECTOR | SUBCUTANEOUS | 3 refills | Status: DC
  Filled 2022-07-23: qty 3, 28d supply, fill #0
  Filled 2022-08-24: qty 3, 28d supply, fill #1
  Filled 2022-10-02: qty 3, 28d supply, fill #2
  Filled 2022-11-08: qty 3, 28d supply, fill #3

## 2022-08-24 ENCOUNTER — Other Ambulatory Visit (HOSPITAL_BASED_OUTPATIENT_CLINIC_OR_DEPARTMENT_OTHER): Payer: Self-pay

## 2022-10-02 ENCOUNTER — Other Ambulatory Visit (HOSPITAL_BASED_OUTPATIENT_CLINIC_OR_DEPARTMENT_OTHER): Payer: Self-pay

## 2022-10-08 NOTE — Progress Notes (Unsigned)
GYNECOLOGY ANNUAL PREVENTATIVE CARE ENCOUNTER NOTE  History:     Tammy Roman is a 42 y.o. [id number] female here for a routine annual gynecologic exam.  Current complaints: none.   Denies abnormal vaginal bleeding, discharge, pelvic pain, problems with intercourse or other gynecologic concerns.    Gynecologic History Patient's last menstrual period was 09/24/2022. Contraception: abstinence Last Pap: 06/13/2020. Result was normal with negative HPV  Obstetric History OB History  Gravida Para Term Preterm AB Living  2 2 2     2   SAB IAB Ectopic Multiple Live Births          1    # Outcome Date GA Lbr Len/2nd Weight Sex Delivery Anes PTL Lv  2 Term 05/23/12 [redacted]w[redacted]d 05:15 / 00:27 8 lb 9 oz (3.884 kg) M Vag-Spont EPI  LIV  1 Term 01/22/03 [redacted]w[redacted]d  7 lb 14 oz (3.572 kg) F Vag-Spont       Past Medical History:  Diagnosis Date   Abnormal Pap smear of cervix    HPV   Diabetes mellitus without complication (HCC)    HSV-2 (herpes simplex virus 2) infection    Obesity     Past Surgical History:  Procedure Laterality Date   WISDOM TOOTH EXTRACTION      Current Outpatient Medications on File Prior to Visit  Medication Sig Dispense Refill   aspirin 81 MG chewable tablet Chew by mouth.     atorvastatin (LIPITOR) 10 MG tablet Take 1 tablet (10 mg total) by mouth once a week. 12 tablet 3   metFORMIN (GLUCOPHAGE) 500 MG tablet Take by mouth 2 (two) times daily with a meal.     Semaglutide, 1 MG/DOSE, (OZEMPIC, 1 MG/DOSE,) 4 MG/3ML SOPN Inject 1 mg into the skin once a week. 3 mL 3   Semaglutide, 1 MG/DOSE, (OZEMPIC, 1 MG/DOSE,) 4 MG/3ML SOPN Inject 1 mg into the skin once a week. 3 mL 5   Semaglutide, 2 MG/DOSE, (OZEMPIC, 2 MG/DOSE,) 8 MG/3ML SOPN Inject 2 mg into the skin once a week. 3 mL 3   Semaglutide,0.25 or 0.5MG /DOS, (OZEMPIC, 0.25 OR 0.5 MG/DOSE,) 2 MG/1.5ML SOPN Inject into the skin.     valACYclovir (VALTREX) 500 MG tablet Take by mouth.     No current  facility-administered medications on file prior to visit.    No Known Allergies  Social History:  reports that she has never smoked. She has never used smokeless tobacco. She reports that she does not drink alcohol and does not use drugs.  Family History  Problem Relation Age of Onset   Hyperlipidemia Mother    Hypertension Mother    Hyperlipidemia Father    Hypertension Father    Prostate cancer Father    Cancer Father        prostate   Heart attack Father    Breast cancer Paternal Aunt        metastasized    Ovarian cancer Paternal Aunt     The following portions of the patient's history were reviewed and updated as appropriate: allergies, current medications, past family history, past medical history, past social history, past surgical history and problem list.  Review of Systems Pertinent items noted in HPI and remainder of comprehensive ROS otherwise negative.  Physical Exam:  BP 129/82   Pulse (!) 109   Ht 5\' 7"  (1.702 m)   Wt 255 lb (115.7 kg)   LMP 09/24/2022   BMI 39.94 kg/m  CONSTITUTIONAL: Well-developed, well-nourished female in no  acute distress.  HENT:  Normocephalic, atraumatic, External right and left ear normal.  EYES: Conjunctivae and EOM are normal. Pupils are equal, round, and reactive to light. No scleral icterus.  NECK: Normal range of motion, supple, no masses.  Normal thyroid.  SKIN: Skin is warm and dry. No rash noted. Not diaphoretic. No erythema. No pallor. MUSCULOSKELETAL: Normal range of motion. No tenderness.  No cyanosis, clubbing, or edema. NEUROLOGIC: Alert and oriented to person, place, and time. Normal reflexes, muscle tone coordination.  PSYCHIATRIC: Normal mood and affect. Normal behavior. Normal judgment and thought content. CARDIOVASCULAR: Normal heart rate noted, regular rhythm RESPIRATORY: Clear to auscultation bilaterally. Effort and breath sounds normal, no problems with respiration noted. BREASTS: Symmetric in size. No masses,  tenderness, skin changes, nipple drainage, or lymphadenopathy bilaterally. Performed in the presence of a chaperone. ABDOMEN: Soft, no distention noted.  No tenderness, rebound or guarding.  PELVIC: Normal appearing external genitalia and urethral meatus; normal appearing vaginal mucosa and cervix.  No abnormal vaginal discharge noted.  Pap smear obtained.  Normal uterine size, no other palpable masses, no uterine or adnexal tenderness.  Performed in the presence of a chaperone.   Assessment and Plan:     1. Breast cancer screening by mammogram Mammogram scheduled - MM 3D SCREENING MAMMOGRAM BILATERAL BREAST; Future  2. Routine screening for STI (sexually transmitted infection) STi screen done as per patient's request, will follow up results and manage accordingly. - Cytology - PAP - Hepatitis C antibody; Future - Hepatitis B surface antigen; Future - HIV Antibody (routine testing w rflx); Future - RPR; Future  3. Well woman exam with routine gynecological exam - Cytology - PAP Will follow up results of pap smear and manage accordingly. Routine preventative health maintenance measures emphasized. Please refer to After Visit Summary for other counseling recommendations.      Jaynie Collins, MD, FACOG Obstetrician & Gynecologist, [facility] for [employer], [facility]

## 2022-10-09 ENCOUNTER — Ambulatory Visit (INDEPENDENT_AMBULATORY_CARE_PROVIDER_SITE_OTHER): Payer: 59 | Admitting: Obstetrics & Gynecology

## 2022-10-09 ENCOUNTER — Other Ambulatory Visit (HOSPITAL_COMMUNITY)
Admission: RE | Admit: 2022-10-09 | Discharge: 2022-10-09 | Disposition: A | Discharge status: Home / Self Care | Payer: 59 | Source: Ambulatory Visit | Attending: Obstetrics & Gynecology | Admitting: Obstetrics & Gynecology

## 2022-10-09 ENCOUNTER — Encounter: Payer: Self-pay | Admitting: Obstetrics & Gynecology

## 2022-10-09 ENCOUNTER — Other Ambulatory Visit: Payer: Self-pay

## 2022-10-09 VITALS — BP 129/82 | HR 109 | Ht 67.0 in | Wt 255.0 lb

## 2022-10-09 DIAGNOSIS — Z113 Encounter for screening for infections with a predominantly sexual mode of transmission: Secondary | ICD-10-CM | POA: Diagnosis not present

## 2022-10-09 DIAGNOSIS — Z01419 Encounter for gynecological examination (general) (routine) without abnormal findings: Secondary | ICD-10-CM | POA: Insufficient documentation

## 2022-10-09 DIAGNOSIS — Z1231 Encounter for screening mammogram for malignant neoplasm of breast: Secondary | ICD-10-CM | POA: Diagnosis not present

## 2022-10-15 LAB — CYTOLOGY - PAP
Chlamydia: NEGATIVE
Comment: NEGATIVE
Comment: NEGATIVE
Comment: NEGATIVE
Comment: NORMAL
Diagnosis: NEGATIVE
High risk HPV: NEGATIVE
Neisseria Gonorrhea: NEGATIVE
Trichomonas: NEGATIVE

## 2022-12-28 LAB — RPR: RPR Ser Ql: NONREACTIVE

## 2022-12-28 LAB — HIV ANTIBODY (ROUTINE TESTING W REFLEX): HIV 1&2 Ab, 4th Generation: NONREACTIVE

## 2022-12-28 LAB — HEPATITIS B SURFACE ANTIGEN: Hepatitis B Surface Ag: NONREACTIVE

## 2022-12-31 ENCOUNTER — Other Ambulatory Visit (HOSPITAL_BASED_OUTPATIENT_CLINIC_OR_DEPARTMENT_OTHER): Payer: Self-pay

## 2023-01-01 ENCOUNTER — Other Ambulatory Visit (HOSPITAL_BASED_OUTPATIENT_CLINIC_OR_DEPARTMENT_OTHER): Payer: Self-pay

## 2023-01-01 MED ORDER — OZEMPIC (2 MG/DOSE) 8 MG/3ML ~~LOC~~ SOPN
2.0000 mg | PEN_INJECTOR | SUBCUTANEOUS | 3 refills | Status: AC
  Filled 2023-01-01 – 2023-01-10 (×2): qty 3, 28d supply, fill #0
  Filled 2023-03-19: qty 3, 28d supply, fill #1
  Filled 2023-05-23: qty 3, 28d supply, fill #2
  Filled 2023-06-27: qty 3, 28d supply, fill #3

## 2023-01-10 ENCOUNTER — Other Ambulatory Visit (HOSPITAL_BASED_OUTPATIENT_CLINIC_OR_DEPARTMENT_OTHER): Payer: Self-pay

## 2023-02-04 ENCOUNTER — Other Ambulatory Visit: Payer: Self-pay | Admitting: Obstetrics and Gynecology

## 2023-03-19 ENCOUNTER — Other Ambulatory Visit (HOSPITAL_BASED_OUTPATIENT_CLINIC_OR_DEPARTMENT_OTHER): Payer: Self-pay

## 2023-03-20 ENCOUNTER — Encounter (HOSPITAL_BASED_OUTPATIENT_CLINIC_OR_DEPARTMENT_OTHER): Payer: Self-pay | Admitting: Urology

## 2023-03-20 ENCOUNTER — Emergency Department (HOSPITAL_BASED_OUTPATIENT_CLINIC_OR_DEPARTMENT_OTHER)
Admission: EM | Admit: 2023-03-20 | Discharge: 2023-03-20 | Disposition: A | Discharge status: Home / Self Care | Payer: 59 | Attending: Emergency Medicine | Admitting: Emergency Medicine

## 2023-03-20 ENCOUNTER — Other Ambulatory Visit: Payer: Self-pay

## 2023-03-20 ENCOUNTER — Emergency Department (HOSPITAL_BASED_OUTPATIENT_CLINIC_OR_DEPARTMENT_OTHER): Payer: 59

## 2023-03-20 DIAGNOSIS — W19XXXA Unspecified fall, initial encounter: Secondary | ICD-10-CM

## 2023-03-20 DIAGNOSIS — M25511 Pain in right shoulder: Secondary | ICD-10-CM | POA: Insufficient documentation

## 2023-03-20 DIAGNOSIS — Z7982 Long term (current) use of aspirin: Secondary | ICD-10-CM | POA: Diagnosis not present

## 2023-03-20 DIAGNOSIS — M25512 Pain in left shoulder: Secondary | ICD-10-CM | POA: Diagnosis not present

## 2023-03-20 DIAGNOSIS — M79641 Pain in right hand: Secondary | ICD-10-CM | POA: Insufficient documentation

## 2023-03-20 DIAGNOSIS — M79642 Pain in left hand: Secondary | ICD-10-CM | POA: Diagnosis not present

## 2023-03-20 DIAGNOSIS — M25532 Pain in left wrist: Secondary | ICD-10-CM | POA: Diagnosis not present

## 2023-03-20 DIAGNOSIS — M25531 Pain in right wrist: Secondary | ICD-10-CM | POA: Insufficient documentation

## 2023-03-20 DIAGNOSIS — Z794 Long term (current) use of insulin: Secondary | ICD-10-CM | POA: Insufficient documentation

## 2023-03-20 MED ORDER — IBUPROFEN 400 MG PO TABS
600.0000 mg | ORAL_TABLET | Freq: Once | ORAL | Status: AC
  Administered 2023-03-20: 600 mg via ORAL
  Filled 2023-03-20: qty 1

## 2023-03-20 NOTE — ED Provider Notes (Signed)
[place] [facility] Provider Note   CSN: [id number] Arrival date & time: 03/20/23  1313     History  Chief Complaint  Patient presents with   Marletta Lor    Tammy Roman is a 42 y.o. female.  42 year old female presents today for concern of fall that occurred earlier today.  She states she went to [facility] [employer] for coffee when she attempted to wipe her feet on the mat but fell backwards.  She broke her fall by catching herself on her hands.  So she fell directly onto her hands, and her buttocks.  No head injury.  She complains of pain to bilateral wrists, hands as well as shoulder.  No other complaints.  The history is provided by the patient. No language interpreter was used.       Home Medications Prior to Admission medications   Medication Sig Start Date End Date Taking? Authorizing Provider  aspirin 81 MG chewable tablet Chew by mouth. 06/18/18   [provider]  atorvastatin (LIPITOR) 10 MG tablet Take 1 tablet (10 mg total) by mouth once a week. 02/08/19   Doristine Bosworth, MD  metFORMIN (GLUCOPHAGE) 500 MG tablet Take by mouth 2 (two) times daily with a meal.    [provider]  metroNIDAZOLE (FLAGYL) 500 MG tablet TAKE 1 TABLET BY MOUTH TWICE A DAY FOR 7 DAYS 02/04/23   Lennart Pall, MD  Semaglutide, 1 MG/DOSE, (OZEMPIC, 1 MG/DOSE,) 4 MG/3ML SOPN Inject 1 mg into the skin once a week. 11/08/21   Doristine Bosworth., MD  Semaglutide, 1 MG/DOSE, (OZEMPIC, 1 MG/DOSE,) 4 MG/3ML SOPN Inject 1 mg into the skin once a week. 03/13/22     Semaglutide, 2 MG/DOSE, (OZEMPIC, 2 MG/DOSE,) 8 MG/3ML SOPN Inject 2 mg into the skin once a week. 01/01/23     Semaglutide,0.25 or 0.5MG /DOS, (OZEMPIC, 0.25 OR 0.5 MG/DOSE,) 2 MG/1.5ML SOPN Inject into the skin.    [provider]  valACYclovir (VALTREX) 500 MG tablet Take by mouth. 03/12/22   [provider]      Allergies    Patient has no known allergies.    Review  of Systems   Review of Systems  Musculoskeletal:  Positive for arthralgias.  All other systems reviewed and are negative.   Physical Exam Updated Vital Signs BP 120/80 (BP Location: Left Arm)   Pulse (!) 113   Temp 98.6 F (37 C) (Oral)   Resp 20   Ht 5\' 7"  (1.702 m)   Wt 115.7 kg   SpO2 99%   BMI 39.95 kg/m  Physical Exam Vitals and nursing note reviewed.  Constitutional:      General: She is not in acute distress.    Appearance: Normal appearance. She is not ill-appearing.  HENT:     Head: Normocephalic and atraumatic.     Nose: Nose normal.  Eyes:     Conjunctiva/sclera: Conjunctivae normal.  Pulmonary:     Effort: Pulmonary effort is normal. No respiratory distress.  Musculoskeletal:        General: No deformity. Normal range of motion.     Comments: cervical thoracic lumbar spine without tenderness to palpation.  Good range of motion bilateral upper extremities.  Tenderness palpation over dorsal aspect of the hand on the left, bilateral wrist pain, bilateral forearm pain, bilateral shoulder pain.  Skin:    Findings: No rash.  Neurological:     Mental Status: She is alert.  ED Results / Procedures / Treatments   Labs (all labs ordered are listed, but only abnormal results are displayed) Labs Reviewed - No data to display  EKG None  Radiology No results found.  Procedures Procedures    Medications Ordered in ED Medications  ibuprofen (ADVIL) tablet 600 mg (has no administration in time range)    ED Course/ Medical Decision Making/ A&P                                 Medical Decision Making Amount and/or Complexity of Data Reviewed Radiology: ordered.   42 year old female presents today for a fall.  Complaining of pain to bilateral hands, wrist, and forearm and shoulder.  Will obtain x-rays.  No head injury.  No other complaints.  X-rays of bilateral shoulders, bilateral forearm, bilateral hands obtained.  No acute fracture or other bony  abnormality.  Symptomatic management discussed.  Patient discharged in stable condition.  Return precaution discussed.  Final Clinical Impression(s) / ED Diagnoses Final diagnoses:  Fall, initial encounter    Rx / DC Orders ED Discharge Orders     None         Marita [place], PA-C 03/20/23 1742    Alvira Monday, MD 03/20/23 2225

## 2023-03-20 NOTE — Discharge Instructions (Signed)
X-rays without any concern for fracture.  Improvement with ibuprofen.  Keep taking ibuprofen and Tylenol as you need to.  Ice the area that is bothering you the most.  Follow-up with your PCP as needed.  Return for any concerning symptoms.

## 2023-03-20 NOTE — ED Triage Notes (Signed)
Pt states slipped and fell today while at [employer] landed backwards and braced with both arms  States bilateral hand pain and pain in left arm up to shoulder Denies any head injury with fall

## 2023-05-21 ENCOUNTER — Encounter: Payer: Self-pay | Admitting: Obstetrics and Gynecology

## 2023-05-21 ENCOUNTER — Ambulatory Visit: Payer: 59 | Admitting: Obstetrics and Gynecology

## 2023-05-21 ENCOUNTER — Telehealth: Payer: Self-pay | Admitting: Genetic Counselor

## 2023-05-21 VITALS — BP 132/92 | HR 85 | Ht 67.0 in | Wt 251.0 lb

## 2023-05-21 DIAGNOSIS — Z3009 Encounter for other general counseling and advice on contraception: Secondary | ICD-10-CM | POA: Diagnosis not present

## 2023-05-21 DIAGNOSIS — Z8041 Family history of malignant neoplasm of ovary: Secondary | ICD-10-CM | POA: Diagnosis not present

## 2023-05-21 DIAGNOSIS — Z803 Family history of malignant neoplasm of breast: Secondary | ICD-10-CM

## 2023-05-21 DIAGNOSIS — Z8042 Family history of malignant neoplasm of prostate: Secondary | ICD-10-CM | POA: Diagnosis not present

## 2023-05-21 NOTE — Progress Notes (Addendum)
   NEW GYNECOLOGY VISIT  Subjective:  TAHNIA BETKER is a 43 y.o. [id number] with LMP 04/24/23 presenting for tubal consult  Patient is interested in permanent sterilization. She is certain she does not want more children and has completed child bearing. Has been on Mirena  in the past and had a lot of cramping when she had it the second time. She has Fhx of breast/ovary cancer so she would prefer to avoid the IUD if possible. Since she has been off the IUD her cycles are regular and she is not having issues with them.   PMH: T2DM (well controlled, reports recent A1c < 7), HSV, BMI 39. No hx anesthesia rxn PSH: dental Meds: lisinopril (renal protection, no HTN dx), metformin , ozempic , valtrex , ldASA All: NKDA OB: SVDx2 Soc: Denies t/d. 2-3 glasses of wine per month  Last pap 10/09/22 NILM/HPV neg. Hx HPV+ pap 2016.   Objective:   Vitals:   05/21/23 0943  BP: (!) 132/92  Pulse: 85  Weight: 251 lb (113.9 kg)  Height: 5\' 7"  (1.702 m)   General:  Alert, oriented and cooperative. Patient is in no acute distress.  Skin: Skin is warm and dry. No rash noted.   Cardiovascular: Normal heart rate noted  Respiratory: Normal respiratory effort, no problems with respiration noted  Abdomen: Soft, non-tender, non-distended    Assessment and Plan:  LAKEYSHIA SARASIN is a 43 y.o. presenting for tubal consult   Consultation for female sterilization - She desires permanent sterilization. Discussed alternatives including LARC options and vasectomy. Reviewed that they are equally efficacious while lower risk overall  - Discussed surgery of laparoscopic bilateral salpingectomy vs laparoscopic tubal ligation. Recommend salpingectomy for lower risk of contraceptive failure and potential risk reduction for ovarian cancer - Risks of surgery include but are not limited to: bleeding, infection, injury to surrounding organs/tissues (i.e. bowel/bladder/ureters), need for additional procedures, wound  complications, hospital re-admission, and conversion to open surgery, VTE. We reviewed risk of contraceptive failure and risk of regret.  - Reviewed restrictions and recovery following surgery - Tubal papers signed today 05/20/22 - She is considering her options  Family history of ovarian cancer Family history of breast cancer Family history of prostate cancer - Father with prostate cancer, paternal aunt who passed away from ovarian cancer, and paternal aunt with breast cancer - Reports genetic testing at some point, but that there are more tests available now and she would like to learn more. She is unsure if she was tested for BRCA - Discussed that if she is +BRCA, we may want to consider risk reducing BSO rather than BS -     Ambulatory referral to Genetics  I spent 12 minutes face-to-face with the patient and 14 minutes reviewing the chart/documenting our encounter. Total encounter time: 26 minutes  Izell Marsh, MD

## 2023-05-21 NOTE — Telephone Encounter (Signed)
 Scheduled appointments per scheduling message. Patient is aware of the made appointments and will be mailed an appointment reminder.

## 2023-05-21 NOTE — Patient Instructions (Signed)
 It was nice meeting you today! If you would like to move forward with surgery, please call us  or send a MyChart message. I will keep an eye out for the results of any genetic testing that is done.

## 2023-05-28 ENCOUNTER — Other Ambulatory Visit (HOSPITAL_BASED_OUTPATIENT_CLINIC_OR_DEPARTMENT_OTHER): Payer: Self-pay

## 2023-05-28 MED ORDER — OZEMPIC (2 MG/DOSE) 8 MG/3ML ~~LOC~~ SOPN
2.0000 mg | PEN_INJECTOR | SUBCUTANEOUS | 5 refills | Status: DC
  Filled 2023-05-28 – 2023-08-22 (×2): qty 3, 28d supply, fill #0

## 2023-06-06 ENCOUNTER — Inpatient Hospital Stay: Payer: 59

## 2023-06-06 ENCOUNTER — Encounter: Payer: Self-pay | Admitting: Genetic Counselor

## 2023-06-06 ENCOUNTER — Inpatient Hospital Stay (HOSPITAL_BASED_OUTPATIENT_CLINIC_OR_DEPARTMENT_OTHER): Payer: 59 | Attending: Genetic Counselor | Admitting: Genetic Counselor

## 2023-06-06 DIAGNOSIS — Z8042 Family history of malignant neoplasm of prostate: Secondary | ICD-10-CM | POA: Insufficient documentation

## 2023-06-06 DIAGNOSIS — Z8041 Family history of malignant neoplasm of ovary: Secondary | ICD-10-CM | POA: Diagnosis not present

## 2023-06-06 DIAGNOSIS — Z803 Family history of malignant neoplasm of breast: Secondary | ICD-10-CM

## 2023-06-06 LAB — GENETIC SCREENING ORDER

## 2023-06-06 NOTE — Progress Notes (Signed)
REFERRING PROVIDER: Drema Halon, FNP No address on file  PRIMARY PROVIDER:  Drema Halon, FNP  PRIMARY REASON FOR VISIT:  1. Family history of malignant neoplasm of ovary   2. Family history of malignant neoplasm of prostate   3. Family history of malignant neoplasm of breast      HISTORY OF PRESENT ILLNESS:   Tammy Roman, a 43 y.o. female, was seen for a [facility] cancer genetics consultation at the request of Dr. Dalbert Garnet due to a family history of cancer.  Tammy Roman presents to clinic today to discuss the possibility of a hereditary predisposition to cancer, genetic testing, and to further clarify her future cancer risks, as well as potential cancer risks for family members.   Tammy Roman is a 43 y.o. female with no personal history of cancer.  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 Testing otherwise normal. See report in media 03/18/2019.   RISK FACTORS:  Menarche was at age 83.  First live birth at age 22.  OCP use for approximately 10 years. Additional 10 years of Mirena IUD.  Ovaries intact: yes.  Hysterectomy: yes.  Menopausal status: premenopausal.  HRT use: 0 years. Colonoscopy: no; not examined. Mammogram within the last year: yes. Number of breast biopsies: 0. Up to date with pelvic exams: yes. Any excessive radiation exposure in the past: no  Past Medical History:  Diagnosis Date   Abnormal Pap smear of cervix    HPV   Diabetes mellitus without complication (HCC)    HSV-2 (herpes simplex virus 2) infection    Obesity     Past Surgical History:  Procedure Laterality Date   WISDOM TOOTH EXTRACTION      Social History   Socioeconomic History   Marital status: Divorced    Spouse name: Not on file   Number of children: 2   Years of education: Not on file   Highest education level: Not on file  Occupational History    Occupation: CUSTOMER SERVICE    Employer: [employer]  Tobacco Use   Smoking status: Never   Smokeless tobacco: Never  Vaping Use   Vaping status: Never Used  Substance and Sexual Activity   Alcohol use: No    Alcohol/week: 0.0 standard drinks of alcohol   Drug use: No   Sexual activity: Yes    Partners: Male    Birth control/protection: Surgical    Comment: partner had vasectomy  Other Topics Concern   Not on file  Social History Narrative   Some regular exercise.  No caffeine daily.    Social Drivers of Corporate investment banker Strain: Low Risk  (02/08/2019)   Overall Financial Resource Strain (CARDIA)    Difficulty of Paying Living Expenses: Not hard at all  Food Insecurity: Low Risk  (05/28/2023)   Received from [facility]   Hunger Vital Sign    Worried About Running Out of Food in the Last Year: Never true    Ran Out of Food in the Last Year: Never true  Transportation Needs: No Transportation Needs (05/28/2023)   Received from [facility]    In the past 12 months, has lack of reliable transportation kept you from medical appointments, meetings, work or from getting things needed for daily living? : No  Physical Activity: Insufficiently Active (02/08/2019)   Exercise Vital Sign    Days of Exercise per  Week: 2 days    Minutes of Exercise per Session: 60 min  Stress: No Stress Concern Present (02/08/2019)   [employer] of Occupational Health - Occupational Stress Questionnaire    Feeling of Stress : Not at all  Social Connections: Unknown (09/11/2021)   [facility] from [facility], [facility]   Social Network    Social Network: Not on file     FAMILY HISTORY:  We obtained a detailed, 4-generation family history.  Significant diagnoses are listed below: Family History  Problem Relation Age of Onset   Hyperlipidemia Mother    Hypertension Mother    Hyperlipidemia Father    Hypertension Father    Prostate cancer Father 56 - 47        metastatic   Cancer Father        prostate   Heart attack Father    Breast cancer Paternal Aunt 40       metastasized    Ovarian cancer Paternal Aunt 75    Tammy Roman is unaware of previous family history of genetic testing for hereditary cancer risks. There is no reported Ashkenazi Jewish ancestry. There is no known consanguinity.  She reports her father was diagnosed with prostate cancer in his 17s, metastatic to bone and spine, deceased in his late 78s. She reports her paternal aunt was diagnosed with breast cancer in her early 70s and passed away in her 66s. She reports her paternal aunt was diagnosed with ovarian cancer at age 64, passed away. She reports two maternal second cousins diagnosed with prostate cancer in their 69s, one with metastatic disease that passed away at age 32.     GENETIC COUNSELING ASSESSMENT: Tammy Roman is a 43 y.o. female with a family history of cancer which is suggestive of a hereditary predisposition to cancer. We, therefore, discussed and recommended the following at today's visit.   DISCUSSION: We discussed that, in general, most cancer is not inherited in families, but instead is sporadic or familial. Sporadic cancers occur by chance and typically happen at older ages (>50 years) as this type of cancer is caused by genetic changes acquired during an individual's lifetime. Some families have more cancers than would be expected by chance; however, the ages or types of cancer are not consistent with a known genetic mutation or known genetic mutations have been ruled out. This type of familial cancer is thought to be due to a combination of multiple genetic, environmental, hormonal, and lifestyle factors. While this combination of factors likely increases the risk of cancer, the exact source of this risk is not currently identifiable or testable.  We discussed that 5 - 10% of cancer is hereditary. We discussed that testing is beneficial for several reasons  including knowing how identifying options for screening and prevention of cancer if there is hereditary risk, guiding treatments for individuals diagnosed with cancer, and identifying at risk family members.   We reviewed the characteristics, features and inheritance patterns of hereditary cancer syndromes. We also discussed genetic testing, including the appropriate family members to test, the process of testing, insurance coverage and turn-around-time for results. We discussed the implications of a negative, positive, carrier and/or variant of uncertain significant result. Tammy Roman  was offered a common hereditary cancer panel (36+ genes) and an expanded pan-cancer panel (70+ genes). Tammy Roman was informed of the benefits and limitations of each panel, including that expanded pan-cancer panels contain genes that do not have clear management guidelines at this point in time.  We  also discussed that as the number of genes included on a panel increases, the chances of variants of uncertain significance increases. Tammy Roman decided to pursue genetic testing for the [facility] MultiCancer + RNA 70 gene panel.   Based on Tammy Roman's family history of cancer, she meets medical criteria for genetic testing. Though Tammy Roman is not personally affected, there are no affected family members that are willing/able/available to undergo hereditary cancer testing.  Therefore, Tammy Roman the most informative family member available.  Despite that she meets criteria, she may still have an out of pocket cost. We discussed that if her out of pocket cost for testing is over $100, the laboratory will call and confirm whether she wants to proceed with testing.  If the out of pocket cost of testing is less than $100 she will be billed by the genetic testing laboratory.   We discussed that some people do not want to undergo genetic testing due to fear of genetic discrimination.  The Genetic Information  Nondiscrimination Act (GINA) was signed into federal law in 2008. GINA prohibits health insurers and most employers from discriminating against individuals based on genetic information (including the results of genetic tests and family history information). According to GINA, health insurance companies cannot consider genetic information to be a preexisting condition, nor can they use it to make decisions regarding coverage or rates. GINA also makes it illegal for most employers to use genetic information in making decisions about hiring, firing, promotion, or terms of employment. It is important to note that GINA does not offer protections for life insurance, disability insurance, or long-term care insurance. GINA does not apply to those in the [employer], those who work for companies with less than 15 employees, and new life insurance or long-term disability insurance policies.  Health status due to a cancer diagnosis is not protected under GINA. More information about GINA can be found by visiting [email or web address].  We reviewed the characteristics, features and inheritance patterns of hereditary cancer syndromes. We also discussed genetic testing, including the appropriate family members to test, the process of testing, insurance coverage and turn-around-time for results. We discussed the implications of a negative, positive and/or variant of uncertain significant result. In order to get genetic test results in a timely manner so that Tammy Roman can use these genetic test results for surgical decisions, we recommended Tammy Roman pursue genetic testing for the [facility] Multi-Cancer + RNA 70 gene panel. The Multi-Cancer + RNA Panel offered by [facility] includes sequencing and/or deletion/duplication analysis of the following 70 genes:  AIP*, ALK, APC*, ATM*, AXIN2*, BAP1*, BARD1*, BLM*, BMPR1A*, BRCA1*, BRCA2*, BRIP1*, CDC73*, CDH1*, CDK4, CDKN1B*, CDKN2A, CHEK2*, CTNNA1*, DICER1*, EPCAM (del/dup only), EGFR,  FH*, FLCN*, GREM1 (promoter dup only), HOXB13, KIT, LZTR1, MAX*, MBD4, MEN1*, MET, MITF, MLH1*, MSH2*, MSH3*, MSH6*, MUTYH*, NF1*, NF2*, NTHL1*, PALB2*, PDGFRA, PMS2*, POLD1*, POLE*, POT1*, PRKAR1A*, PTCH1*, PTEN*, RAD51C*, RAD51D*, RB1*, RET, SDHA* (sequencing only), SDHAF2*, SDHB*, SDHC*, SDHD*, SMAD4*, SMARCA4*, SMARCB1*, SMARCE1*, STK11*, SUFU*, TMEM127*, TP53*, TSC1*, TSC2*, VHL*. RNA analysis is performed for * genes.  Based on Tammy Roman's family history of cancer, she meets medical criteria for genetic testing. Despite that she meets criteria, she may still have an out of pocket cost.   The Tyrer-Cuzick model is one of multiple prediction models developed to estimate an individual's lifetime risk of developing breast cancer. The Tyrer-Cuzick model is endorsed by the [employer] (NCCN). This model includes many risk factors such as family history, endogenous estrogen exposure,  and benign breast disease. The calculation is highly-dependent on the accuracy of clinical data provided by the patient and can change over time. The Tyrer-Cuzick model may be repeated to reflect new information in her personal or family history in the future. This model will be run with return of results and discussed with Tammy Roman at that time.   PLAN: After considering the risks, benefits, and limitations, Tammy Roman provided informed consent to pursue genetic testing and the blood sample was sent to [facility] for analysis of the MultiCancer + RNA panel. Results should be available within approximately 2-3 weeks' time, at which point they will be disclosed by telephone to Tammy Roman, as will any additional recommendations warranted by these results. Tammy Roman will receive a summary of her genetic counseling visit and a copy of her results once available. This information will also be available in Epic.   Lastly, we encouraged Tammy Roman to remain in contact with  cancer genetics annually so that we can continuously update the family history and inform her of any changes in cancer genetics and testing that may be of benefit for this family.   Tammy Roman. [place] questions were answered to her satisfaction today. Our contact information was provided should additional questions or concerns arise. Thank you for the referral and allowing [place] to share in the care of your patient.   Vassie Moment, Tammy Roman, [facility] Licensed, Retail [email or web address] phone: [phone]  60 minutes were spent on the date of the encounter in service to the patient including preparation, face-to-face consultation, documentation and care coordination.   The patient was seen alone.  Drs. Meliton Rattan, and/or [place] were available for questions, if needed..    _______________________________________________________________________ For Office Staff:  Number of people involved in session: 1 Was an Intern/ student involved with case: no

## 2023-06-16 ENCOUNTER — Telehealth: Payer: Self-pay | Admitting: Genetic Counselor

## 2023-06-18 ENCOUNTER — Ambulatory Visit: Payer: Self-pay | Admitting: Genetic Counselor

## 2023-06-18 ENCOUNTER — Encounter: Payer: Self-pay | Admitting: Genetic Counselor

## 2023-06-18 DIAGNOSIS — Z803 Family history of malignant neoplasm of breast: Secondary | ICD-10-CM

## 2023-06-18 DIAGNOSIS — Z8042 Family history of malignant neoplasm of prostate: Secondary | ICD-10-CM

## 2023-06-18 DIAGNOSIS — Z8041 Family history of malignant neoplasm of ovary: Secondary | ICD-10-CM

## 2023-06-18 DIAGNOSIS — Z1379 Encounter for other screening for genetic and chromosomal anomalies: Secondary | ICD-10-CM

## 2023-06-18 NOTE — Progress Notes (Signed)
HPI:  Tammy Roman was previously seen in the [facility] due to a family history of cancer and concerns regarding a hereditary predisposition to cancer. Please refer to our prior cancer genetics clinic note for more information regarding our discussion, assessment and recommendations, at the time. Tammy Roman recent genetic test results were disclosed to her, as were recommendations warranted by these results. These results and recommendations are discussed in more detail below.  FAMILY HISTORY:  We obtained a detailed, 4-generation family history.  Significant diagnoses are listed below: Family History  Problem Relation Age of Onset   Hyperlipidemia Mother    Hypertension Mother    Hyperlipidemia Father    Hypertension Father    Prostate cancer Father 47 - 33       metastatic   Cancer Father        prostate   Heart attack Father    Breast cancer Paternal Aunt 40       metastasized    Ovarian cancer Paternal Aunt 88    Ms. Marcinek is unaware of previous family history of genetic testing for hereditary cancer risks. There is no reported Ashkenazi Jewish ancestry. There is no known consanguinity.   She reports her father was diagnosed with prostate cancer in his 39s, metastatic to bone and spine, deceased in his late 22s. She reports her paternal aunt was diagnosed with breast cancer in her early 90s and passed away in her 26s. She reports her paternal aunt was diagnosed with ovarian cancer at age 47, passed away. She reports two maternal second cousins diagnosed with prostate cancer in their 7s, one with metastatic disease that passed away at age 56.      GENETIC TEST RESULTS: Genetic testing reported out on 06/14/23 through the [facility] MultiCancer +RNA hereditary cancer panel found no pathogenic mutations. The Multi-Cancer + RNA Panel offered by [facility] includes sequencing and/or deletion/duplication analysis of the following 70 genes:  AIP*, ALK, APC*,  ATM*, AXIN2*, BAP1*, BARD1*, BLM*, BMPR1A*, BRCA1*, BRCA2*, BRIP1*, CDC73*, CDH1*, CDK4, CDKN1B*, CDKN2A, CHEK2*, CTNNA1*, DICER1*, EPCAM (del/dup only), EGFR, FH*, FLCN*, GREM1 (promoter dup only), HOXB13, KIT, LZTR1, MAX*, MBD4, MEN1*, MET, MITF, MLH1*, MSH2*, MSH3*, MSH6*, MUTYH*, NF1*, NF2*, NTHL1*, PALB2*, PDGFRA, PMS2*, POLD1*, POLE*, POT1*, PRKAR1A*, PTCH1*, PTEN*, RAD51C*, RAD51D*, RB1*, RET, SDHA* (sequencing only), SDHAF2*, SDHB*, SDHC*, SDHD*, SMAD4*, SMARCA4*, SMARCB1*, SMARCE1*, STK11*, SUFU*, TMEM127*, TP53*, TSC1*, TSC2*, VHL*. RNA analysis is performed for * genes. The test report has been scanned into EPIC and is located under the Molecular Pathology section of the Results Review tab.  A portion of the result report is included below for reference.     We discussed with Ms. Dills that because current genetic testing is not perfect, it is possible there may be a gene mutation in one of these genes that current testing cannot detect, but that chance is small.  We also discussed, that there could be another gene that has not yet been discovered, or that we have not yet tested, that is responsible for the cancer diagnoses in the family. It is also possible there is a hereditary cause for the cancer in the family that Ms. Rodenbeck did not inherit and therefore was not identified in her testing.  Therefore, it is important to remain in touch with cancer genetics in the future so that we can continue to offer Ms. Angus the most up to date genetic testing.   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  At this time, it is unknown if this variant is associated with increased cancer risk or if this is a normal finding, but most variants such as this get reclassified to being inconsequential. It should not be used to make medical management decisions or identify at risk family members. With time, we  suspect the lab will determine the significance of this variant, if any. If we do learn more about it, we will try to contact Ms. Gravois to discuss it further. However, it is important to stay in touch with [place] periodically and keep the address and phone number up to date.  ADDITIONAL GENETIC TESTING: We discussed with Ms. Herne that her genetic testing was fairly extensive.  If there are genes identified to increase cancer risk that can be analyzed in the future, we would be happy to discuss and coordinate this testing at that time.    CANCER SCREENING RECOMMENDATIONS: Ms. Cozart test result is considered negative (normal).  This means that we have not identified a hereditary cause for her family history of cancer at this time. Most cancers happen by chance and this negative test suggests that her family history of cancer may fall into this category.    Possible reasons for Ms. Marcantonio's negative genetic test include:  1. There may be a gene mutation in one of these genes that current testing methods cannot detect but that chance is small.  2. There could be another gene that has not yet been discovered, or that we have not yet tested, that is responsible for the cancer diagnoses in the family.  3.  There may be no hereditary risk for cancer in the family. The cancers in Ms. Creech and/or her family may be sporadic/familial or due to other genetic and environmental factors. 4. It is also possible there is a hereditary cause for the cancer in the family that Ms. Harries did not inherit.  Therefore, it is recommended she continue to follow the cancer management and screening guidelines provided by her primary healthcare provider. An individual's cancer risk and medical management are not determined by genetic test results alone. Overall cancer risk assessment incorporates additional factors, including personal medical history, family history, and any available genetic information  that may result in a personalized plan for cancer prevention and surveillance  Given Ms. Lupe's family histories, we must interpret these negative results with some caution.  Families with features suggestive of hereditary risk for cancer tend to have multiple family members with cancer, diagnoses in multiple generations and diagnoses before the age of 59. Ms. Wetherby family exhibits some of these features. Thus, this result may simply reflect our current inability to detect all mutations within these genes or there may be a different gene that has not yet been discovered or tested.   An individual's cancer risk and medical management are not determined by genetic test results alone. Overall cancer risk assessment incorporates additional factors, including personal medical history, family history, and any available genetic information that may result in a personalized plan for cancer prevention and surveillance.  Based on Ms. Twersky's family of cancer, as well as her genetic test results, statistical models (Tyrer Cuzick/IBIS v8) were used to estimate her risk of developing breast cancer. This estimates her lifetime risk of developing breast to be approximately 10.3% over her lifetime and 1.7% over the next 10 years.  The patient's lifetime breast cancer risk is a preliminary estimate based on available information using one  of several models endorsed by the [employer] (NCCN).  The NCCN recommends consideration of breast MRI screening as an adjunct to mammography for patients at high risk (defined as 20% or greater lifetime risk). Please note that a woman's breast cancer risk changes over time. It may increase or decrease based on age and any changes to the personal and/or family medical history. The risks and recommendations listed above apply to this patient at this point in time. In the future, she may or may not be eligible for the same medical management  strategies and, in some cases, other medical management strategies may become available to her. If she is interested in an updated breast cancer risk assessment at a later date, she can contact us.     RECOMMENDATIONS FOR FAMILY MEMBERS:  Individuals in this family might be at some increased risk of developing cancer, over the general population risk, simply due to the family history of cancer.  We recommended women in this family have a yearly mammogram beginning at age 81, or 41 years younger than the earliest onset of cancer, an annual clinical breast exam, and perform monthly breast self-exams. Women in this family should also have a gynecological exam as recommended by their primary provider. All family members should be referred for colonoscopy starting at age 76, or 68 years younger than the earliest onset of cancer.  Family members may benefit from their own genetic testing.   FOLLOW-UP: Lastly, we discussed with Ms. Melby that cancer genetics is a rapidly advancing field and it is possible that new genetic tests will be appropriate for her and/or her family members in the future. We encouraged her to remain in contact with cancer genetics on an annual basis so we can update her personal and family histories and let her know of advances in cancer genetics that may benefit this family.   Our contact number was provided. Ms. Blase questions were answered to her satisfaction, and she knows she is welcome to call us at anytime with additional questions or concerns.   Vassie Moment, MS, [facility] Licensed, Retail [email or web address] [phone]

## 2023-06-18 NOTE — Telephone Encounter (Signed)
I spoke to Tammy Roman to review results of genetic testing. she had genetic testing with [facility]'s MultiCancer +RNA panel. 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 We discussed that we do not use VUS to make management decisions or identify at risk family members. Discussed that we do not know why there is cancer in the family. It could be due to a different gene that we are not testing, or maybe our current technology may not be able to pick something up.  It will be important for her to keep in contact with genetics to keep up with whether additional testing may be needed. We will contact her if new information is learned about the VUS.   TC <20%, no changes to breast cancer surveillance recommended at this time.  Please see counseling note for further detail on this result and recommendations.

## 2023-08-22 ENCOUNTER — Other Ambulatory Visit (HOSPITAL_BASED_OUTPATIENT_CLINIC_OR_DEPARTMENT_OTHER): Payer: Self-pay

## 2023-12-17 ENCOUNTER — Other Ambulatory Visit (HOSPITAL_COMMUNITY)
Admission: RE | Admit: 2023-12-17 | Discharge: 2023-12-17 | Disposition: A | Discharge status: Home / Self Care | Source: Ambulatory Visit | Attending: Obstetrics and Gynecology | Admitting: Obstetrics and Gynecology

## 2023-12-17 ENCOUNTER — Ambulatory Visit: Admitting: Obstetrics and Gynecology

## 2023-12-17 ENCOUNTER — Encounter: Payer: Self-pay | Admitting: Obstetrics and Gynecology

## 2023-12-17 VITALS — BP 118/79 | HR 88 | Ht 67.0 in | Wt 240.0 lb

## 2023-12-17 DIAGNOSIS — Z1331 Encounter for screening for depression: Secondary | ICD-10-CM

## 2023-12-17 DIAGNOSIS — N898 Other specified noninflammatory disorders of vagina: Secondary | ICD-10-CM

## 2023-12-17 DIAGNOSIS — Z01419 Encounter for gynecological examination (general) (routine) without abnormal findings: Secondary | ICD-10-CM | POA: Insufficient documentation

## 2023-12-17 MED ORDER — FLUCONAZOLE 150 MG PO TABS: 150.0000 mg | ORAL_TABLET | Freq: Once | ORAL | 0 refills | Status: AC

## 2023-12-17 NOTE — Progress Notes (Signed)
   ANNUAL EXAM Patient name: Tammy Roman MRN [id number]  Date of birth: [birth year] Chief Complaint:   Gynecologic Exam (Pt reported recently took antibiotic before having dental work then noticed a discharge with odor, will not go away, tried otc treatment. )  History of Present Illness:   Tammy Roman is a 43 y.o. [id number] female being seen today for a routine annual exam.   Current concerns: vaginal discharge with odor since taking an antibiotic for dental work back in July. Took monistat 7 but hasn't seen relief. States this has happened in the past and Diflucan  helped.  Current birth control: condoms. Desires BTL  Patient's last menstrual period was 12/12/2023 (exact date).   Last MXR: March 2023- normal Last Pap/Pap History:  June 2024.  Results were: NILM w/ HRHPV negative. H/O abnormal pap: yes 2016, +HRHPV. Normal paps since  Review of Systems:   Pertinent items are noted in HPI Denies any headaches, blurred vision, fatigue, shortness of breath, chest pain, abdominal pain, abnormal vaginal discharge/itching/odor/irritation, problems with periods, bowel movements, urination, or intercourse unless otherwise stated above.  Pertinent History Reviewed:  Reviewed past medical,surgical, social and family history.  Reviewed problem list, medications and allergies. Physical Assessment:   Vitals:   12/17/23 0843  BP: 118/79  Pulse: 88  Weight: 108.9 kg  Height: 5' 7 (1.702 m)  Body mass index is 37.59 kg/m.   Physical Examination:  General appearance - well appearing, and in no distress Mental status - alert, oriented to person, place, and time Psych:  She has a normal mood and affect Skin - warm and dry, normal color, no suspicious lesions noted Chest - effort normal Heart - normal rate  Breasts - breasts appear normal, no suspicious masses, no skin or nipple changes or axillary nodes Abdomen - soft, nontender, nondistended, no masses or organomegaly Pelvic  -  Performed and: VULVA: normal appearing vulva with no masses, tenderness or lesions Extremities:  No swelling or varicosities noted  Chaperone present for exam  Assessment & Plan:  Tammy Roman was seen today for gynecologic exam.  Diagnoses and all orders for this visit:  Well woman exam with routine gynecological exam -     Cervicovaginal ancillary only -     MM 3D SCREENING MAMMOGRAM BILATERAL BREAST; Future       -     Patient desires BTL. Will check with insurance for coverage and then will send message to office for scheduling.        -     Normal pap June 2024. Will need repeat pap in 5 years.       -     STI testing today per patient request  Vaginal discharge -     Cervicovaginal ancillary only -     fluconazole  (DIFLUCAN ) 150 MG tablet; Take 1 tablet (150 mg total) by mouth once for 1 dose.        Orders Placed This Encounter  Procedures   MM 3D SCREENING MAMMOGRAM BILATERAL BREAST    Meds:  Meds ordered this encounter  Medications   fluconazole  (DIFLUCAN ) 150 MG tablet    Sig: Take 1 tablet (150 mg total) by mouth once for 1 dose.    Dispense:  1 tablet    Refill:  0    Follow-up: as needed for GYN care.   Elenor Mole, [facility] 12/17/23

## 2023-12-18 ENCOUNTER — Ambulatory Visit: Payer: Self-pay | Admitting: Obstetrics and Gynecology

## 2023-12-18 LAB — CERVICOVAGINAL ANCILLARY ONLY
Bacterial Vaginitis (gardnerella): NEGATIVE
Candida Glabrata: NEGATIVE
Candida Vaginitis: NEGATIVE
Chlamydia: NEGATIVE
Comment: NEGATIVE
Comment: NEGATIVE
Comment: NEGATIVE
Comment: NEGATIVE
Comment: NEGATIVE
Comment: NORMAL
Neisseria Gonorrhea: NEGATIVE
Trichomonas: NEGATIVE

## 2023-12-19 ENCOUNTER — Telehealth: Payer: Self-pay | Admitting: *Deleted

## 2023-12-19 NOTE — Telephone Encounter (Signed)
 Returned call from 12/18/2023 at 1:38 PM. Mailbox is full.

## 2023-12-26 ENCOUNTER — Ambulatory Visit: Admitting: Obstetrics and Gynecology

## 2024-01-06 IMAGING — MG MM DIGITAL SCREENING BILAT W/ TOMO AND CAD
8 series · 8 of 24 positions shown · non-contrast
Comparison: Previous exam(s).

CLINICAL DATA: Screening.

EXAM:
DIGITAL SCREENING BILATERAL MAMMOGRAM WITH TOMOSYNTHESIS AND CAD
TECHNIQUE: Bilateral screening digital craniocaudal and mediolateral oblique
mammograms were obtained. Bilateral screening digital breast
tomosynthesis was performed. The images were evaluated with
computer-aided detection.

[R MLO synth-2D]
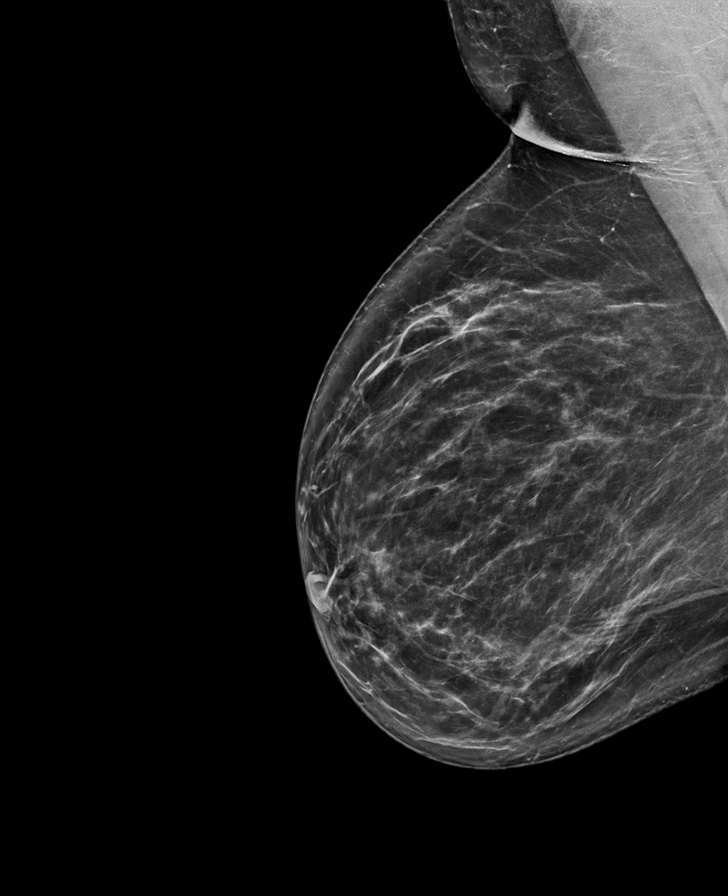

[L CC synth-2D]
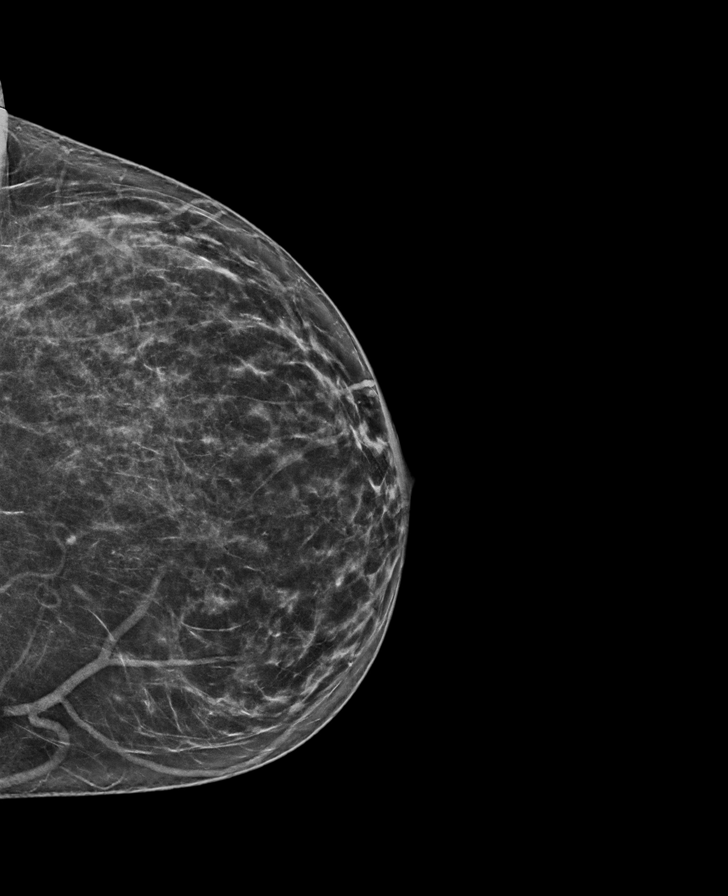

[R CC synth-2D]
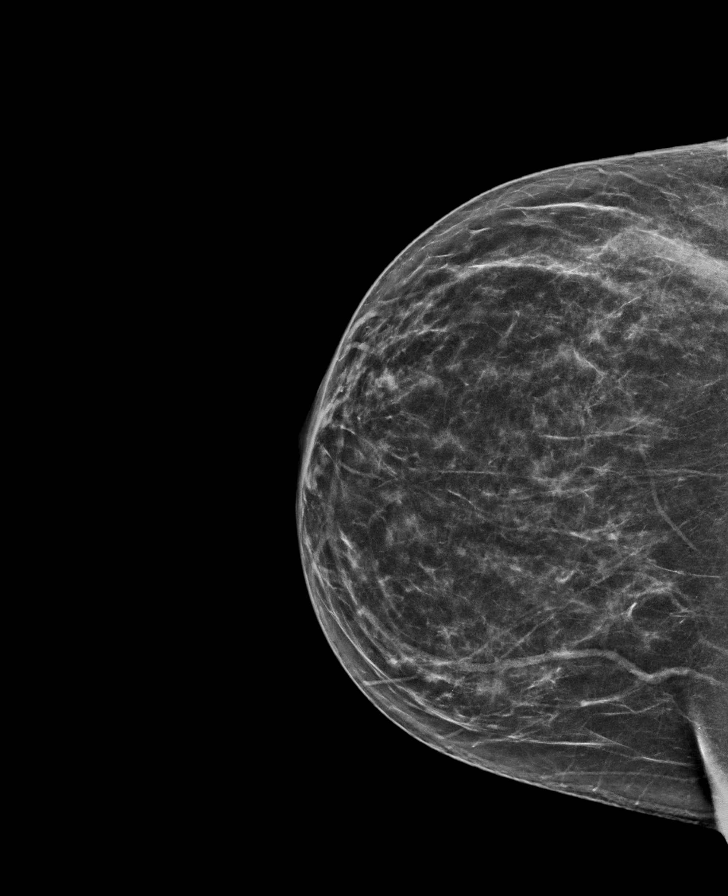

[L MLO synth-2D]
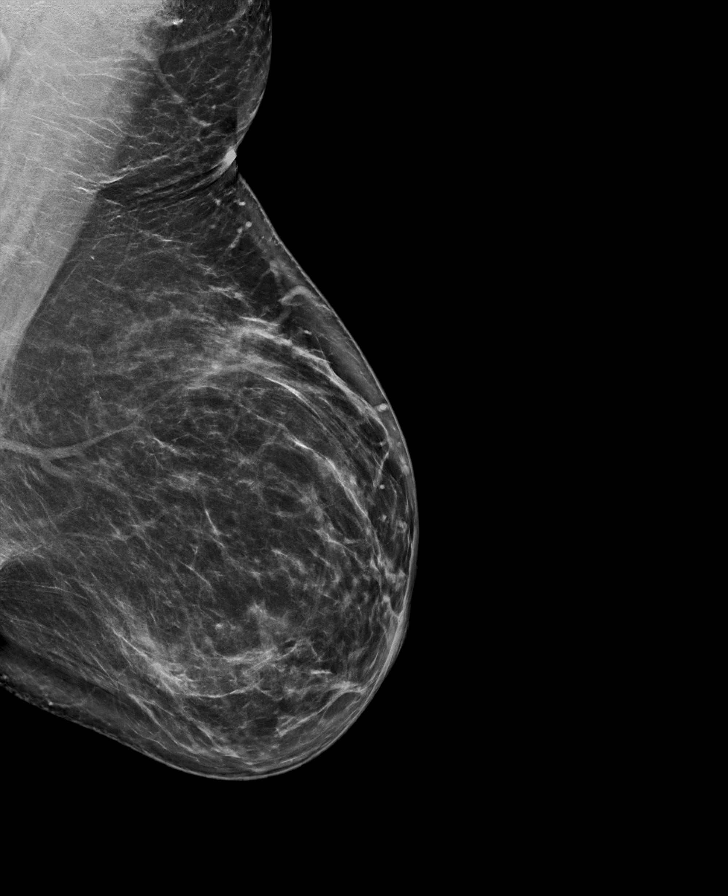

[R MLO tomo · tomo slice 45/88.0]
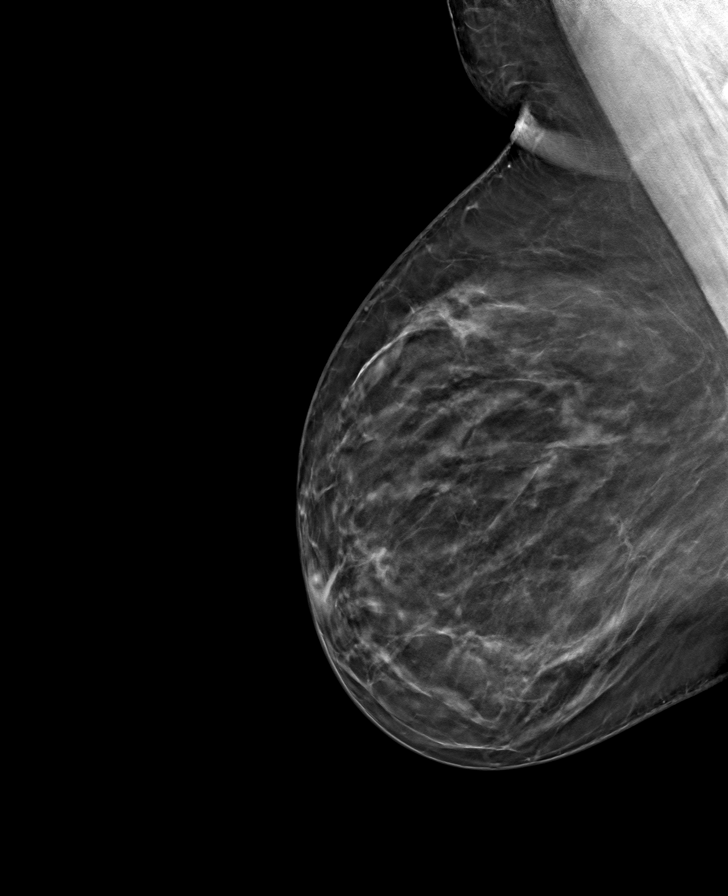

[L MLO tomo · tomo slice 46/91.0]
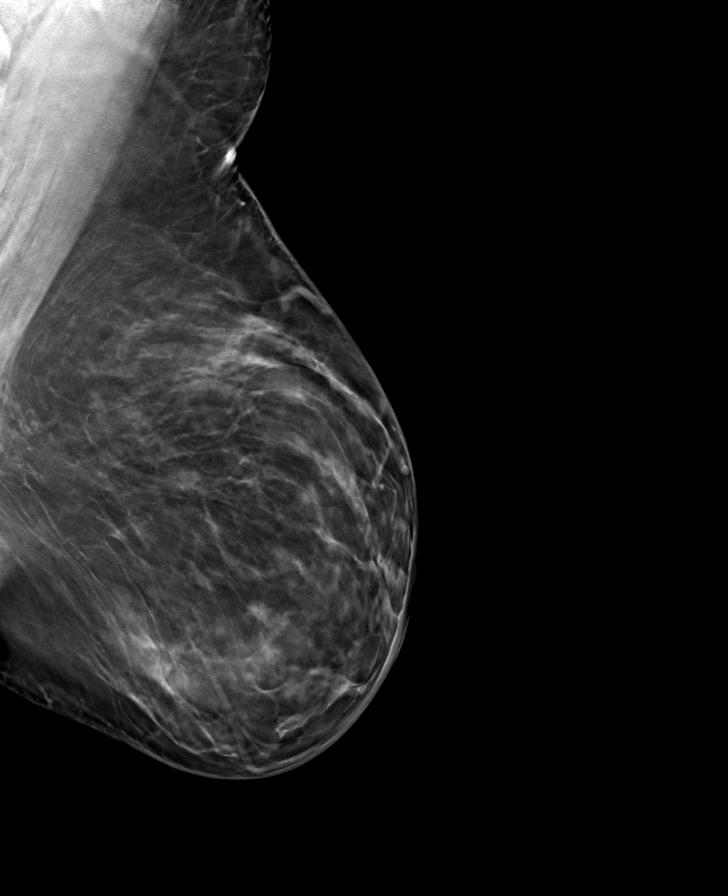

[L CC tomo · tomo slice 35/70.0]
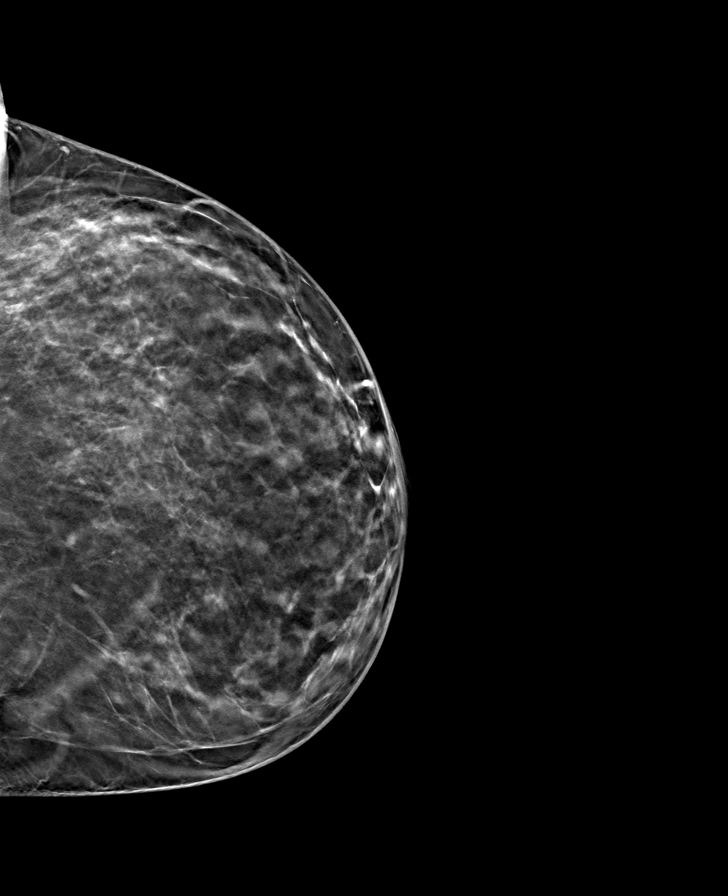

[R CC tomo · tomo slice 37/72.0]
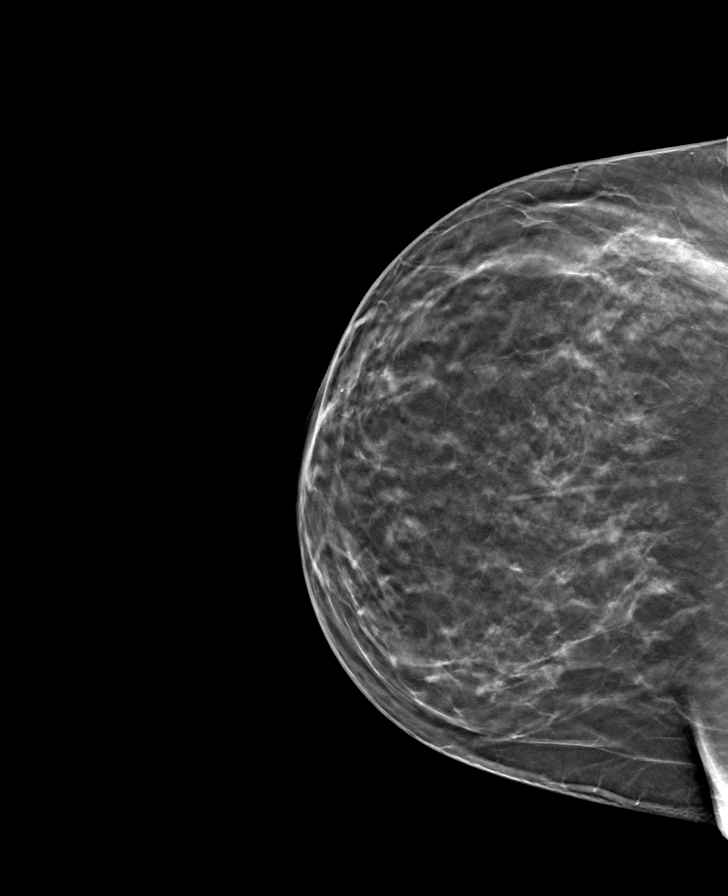

[8 of 24 positions shown; findings below may reference images not displayed]

ACR Breast Density Category b: There are scattered areas of
fibroglandular density.
FINDINGS: There are no findings suspicious for malignancy.
IMPRESSION: No mammographic evidence of malignancy. A result letter of this
screening mammogram will be mailed directly to the patient.

RECOMMENDATION:
Screening mammogram in one year. (Code:51-O-LD2)

BI-RADS CATEGORY  1: Negative.

## 2024-01-09 ENCOUNTER — Ambulatory Visit

## 2024-01-13 ENCOUNTER — Other Ambulatory Visit: Payer: Self-pay | Admitting: Obstetrics and Gynecology

## 2024-01-13 DIAGNOSIS — Z3009 Encounter for other general counseling and advice on contraception: Secondary | ICD-10-CM

## 2024-01-13 NOTE — Progress Notes (Signed)
 Pt called office stating she would like BTL scheduled ideally around 12/23. Will place surgery order w/ timeline requested for me or [place]. December schedule is not out yet.   Kieth Carolin, MD Obstetrician & Gynecologist, [facility] for [employer], [facility]

## 2024-01-16 ENCOUNTER — Ambulatory Visit
Admission: RE | Admit: 2024-01-16 | Discharge: 2024-01-16 | Disposition: A | Discharge status: Home / Self Care | Source: Ambulatory Visit | Attending: Obstetrics and Gynecology | Admitting: Obstetrics and Gynecology

## 2024-01-16 ENCOUNTER — Ambulatory Visit

## 2024-01-16 DIAGNOSIS — Z01419 Encounter for gynecological examination (general) (routine) without abnormal findings: Secondary | ICD-10-CM

## 2024-03-04 ENCOUNTER — Telehealth: Payer: Self-pay

## 2024-03-04 NOTE — Telephone Encounter (Signed)
 I called patient to schedule surgery w/ Dr. Erik. Patient asked to be scheduled on 05/03/24 at 0730 at [facility]. Surgery details and pre-op instructions were provided by phone.

## 2024-04-19 ENCOUNTER — Other Ambulatory Visit: Payer: Self-pay | Admitting: Obstetrics and Gynecology

## 2024-04-19 DIAGNOSIS — Z302 Encounter for sterilization: Secondary | ICD-10-CM

## 2024-04-19 NOTE — Progress Notes (Signed)
OR orders entered 

## 2024-04-20 NOTE — Progress Notes (Signed)
 Patient asked to cancel surgery because she does not the cost and her schedule has since changed.  Notified Benita Gains.

## 2024-05-03 ENCOUNTER — Ambulatory Visit (HOSPITAL_COMMUNITY): Admission: RE | Admit: 2024-05-03 | Admitting: Obstetrics and Gynecology

## 2024-05-03 ENCOUNTER — Encounter: Admission: RE | Payer: Self-pay

## 2024-05-03 SURGERY — SALPINGECTOMY, BILATERAL, LAPAROSCOPIC
Anesthesia: Choice | Laterality: Bilateral
